# Patient Record
Sex: Male | Born: 1950
Health system: Southern US, Community
[De-identification: ages and names within clinical notes are randomized; demographics above are authoritative.]

## PROBLEM LIST (undated history)

## (undated) DIAGNOSIS — A539 Syphilis, unspecified: Secondary | ICD-10-CM

## (undated) DIAGNOSIS — L282 Other prurigo: Secondary | ICD-10-CM

## (undated) DIAGNOSIS — Z87442 Personal history of urinary calculi: Secondary | ICD-10-CM

## (undated) DIAGNOSIS — Z21 Asymptomatic human immunodeficiency virus [HIV] infection status: Secondary | ICD-10-CM

## (undated) DIAGNOSIS — C189 Malignant neoplasm of colon, unspecified: Secondary | ICD-10-CM

## (undated) DIAGNOSIS — N529 Male erectile dysfunction, unspecified: Secondary | ICD-10-CM

## (undated) DIAGNOSIS — N400 Enlarged prostate without lower urinary tract symptoms: Secondary | ICD-10-CM

## (undated) DIAGNOSIS — B20 Human immunodeficiency virus [HIV] disease: Secondary | ICD-10-CM

## (undated) DIAGNOSIS — D751 Secondary polycythemia: Secondary | ICD-10-CM

## (undated) DIAGNOSIS — I10 Essential (primary) hypertension: Secondary | ICD-10-CM

## (undated) HISTORY — DX: Asymptomatic human immunodeficiency virus (hiv) infection status: Z21

## (undated) HISTORY — PX: COLONOSCOPY: SHX174

## (undated) HISTORY — DX: Secondary polycythemia: D75.1

## (undated) HISTORY — PX: PARTIAL COLECTOMY: SHX5273

## (undated) HISTORY — DX: Human immunodeficiency virus (HIV) disease: B20

## (undated) HISTORY — DX: Benign prostatic hyperplasia without lower urinary tract symptoms: N40.0

## (undated) HISTORY — DX: Malignant neoplasm of colon, unspecified: C18.9

## (undated) HISTORY — DX: Male erectile dysfunction, unspecified: N52.9

## (undated) HISTORY — DX: Other prurigo: L28.2

## (undated) HISTORY — DX: Syphilis, unspecified: A53.9

---

## 1997-12-17 ENCOUNTER — Emergency Department (HOSPITAL_COMMUNITY): Admission: EM | Admit: 1997-12-17 | Discharge: 1997-12-17 | Payer: Self-pay | Admitting: Emergency Medicine

## 1997-12-17 ENCOUNTER — Encounter: Payer: Self-pay | Admitting: Emergency Medicine

## 1998-08-19 ENCOUNTER — Emergency Department (HOSPITAL_COMMUNITY): Admission: EM | Admit: 1998-08-19 | Discharge: 1998-08-19 | Payer: Self-pay | Admitting: Emergency Medicine

## 2000-03-05 ENCOUNTER — Emergency Department (HOSPITAL_COMMUNITY): Admission: EM | Admit: 2000-03-05 | Discharge: 2000-03-05 | Payer: Self-pay | Admitting: Emergency Medicine

## 2000-03-05 ENCOUNTER — Encounter: Payer: Self-pay | Admitting: Emergency Medicine

## 2000-03-16 ENCOUNTER — Encounter: Payer: Self-pay | Admitting: Urology

## 2000-03-16 ENCOUNTER — Encounter: Admission: RE | Admit: 2000-03-16 | Discharge: 2000-03-16 | Payer: Self-pay | Admitting: Urology

## 2003-01-08 ENCOUNTER — Ambulatory Visit (HOSPITAL_COMMUNITY): Admission: RE | Admit: 2003-01-08 | Discharge: 2003-01-08 | Payer: Self-pay | Admitting: Vascular Surgery

## 2004-08-16 ENCOUNTER — Inpatient Hospital Stay (HOSPITAL_COMMUNITY): Admission: EM | Admit: 2004-08-16 | Discharge: 2004-08-25 | Payer: Self-pay | Admitting: *Deleted

## 2004-08-31 ENCOUNTER — Emergency Department (HOSPITAL_COMMUNITY): Admission: EM | Admit: 2004-08-31 | Discharge: 2004-09-01 | Payer: Self-pay | Admitting: Emergency Medicine

## 2004-08-31 ENCOUNTER — Encounter: Admission: RE | Admit: 2004-08-31 | Discharge: 2004-08-31 | Payer: Self-pay | Admitting: General Surgery

## 2004-10-08 ENCOUNTER — Encounter: Admission: RE | Admit: 2004-10-08 | Discharge: 2004-10-08 | Payer: Self-pay | Admitting: General Surgery

## 2004-10-26 ENCOUNTER — Encounter (INDEPENDENT_AMBULATORY_CARE_PROVIDER_SITE_OTHER): Payer: Self-pay | Admitting: *Deleted

## 2004-10-26 ENCOUNTER — Inpatient Hospital Stay (HOSPITAL_COMMUNITY): Admission: RE | Admit: 2004-10-26 | Discharge: 2004-10-31 | Payer: Self-pay | Admitting: General Surgery

## 2007-07-19 ENCOUNTER — Emergency Department (HOSPITAL_COMMUNITY): Admission: EM | Admit: 2007-07-19 | Discharge: 2007-07-19 | Payer: Self-pay | Admitting: Emergency Medicine

## 2008-05-14 ENCOUNTER — Inpatient Hospital Stay (HOSPITAL_COMMUNITY): Admission: EM | Admit: 2008-05-14 | Discharge: 2008-05-21 | Payer: Self-pay | Admitting: Emergency Medicine

## 2008-05-15 ENCOUNTER — Encounter (INDEPENDENT_AMBULATORY_CARE_PROVIDER_SITE_OTHER): Payer: Self-pay | Admitting: *Deleted

## 2008-05-15 LAB — CONVERTED CEMR LAB
CD4 Count: 40 microliters
CD4 T Helper %: 7 %
HIV 1 RNA Quant: 65200 copies/mL
Hep A Total Ab: NEGATIVE

## 2008-05-16 ENCOUNTER — Ambulatory Visit: Payer: Self-pay | Admitting: Infectious Disease

## 2008-05-17 ENCOUNTER — Encounter (INDEPENDENT_AMBULATORY_CARE_PROVIDER_SITE_OTHER): Payer: Self-pay | Admitting: Interventional Radiology

## 2008-05-20 LAB — CONVERTED CEMR LAB
HCV Ab: NEGATIVE
Hep B S Ab: NEGATIVE
Hepatitis B Surface Ag: NEGATIVE
Triglyceride fasting, serum: 90 mg/dL

## 2008-06-06 ENCOUNTER — Ambulatory Visit: Payer: Self-pay | Admitting: Infectious Diseases

## 2008-06-06 DIAGNOSIS — B20 Human immunodeficiency virus [HIV] disease: Secondary | ICD-10-CM | POA: Insufficient documentation

## 2008-06-06 DIAGNOSIS — B370 Candidal stomatitis: Secondary | ICD-10-CM | POA: Insufficient documentation

## 2008-06-06 DIAGNOSIS — D61818 Other pancytopenia: Secondary | ICD-10-CM | POA: Insufficient documentation

## 2008-06-06 DIAGNOSIS — J984 Other disorders of lung: Secondary | ICD-10-CM

## 2008-06-06 DIAGNOSIS — N200 Calculus of kidney: Secondary | ICD-10-CM

## 2008-06-07 ENCOUNTER — Encounter: Payer: Self-pay | Admitting: Infectious Diseases

## 2008-07-22 ENCOUNTER — Encounter (INDEPENDENT_AMBULATORY_CARE_PROVIDER_SITE_OTHER): Payer: Self-pay | Admitting: *Deleted

## 2008-07-23 ENCOUNTER — Ambulatory Visit: Payer: Self-pay | Admitting: Infectious Diseases

## 2008-07-23 LAB — CONVERTED CEMR LAB
ALT: 37 units/L (ref 0–53)
AST: 30 units/L (ref 0–37)
Albumin: 4.2 g/dL (ref 3.5–5.2)
CO2: 24 meq/L (ref 19–32)
Chlamydia, Swab/Urine, PCR: NEGATIVE
Cholesterol: 104 mg/dL (ref 0–200)
Eosinophils Absolute: 0.1 10*3/uL (ref 0.0–0.7)
Eosinophils Relative: 3 % (ref 0–5)
GC Probe Amp, Urine: NEGATIVE
GFR calc non Af Amer: >60 mL/min (ref >60)
HDL: 33 mg/dL — ABNORMAL LOW (ref >39)
HIV-1 RNA Quant, Log: <1.68 (ref <1.68)
LDL Cholesterol: 52 mg/dL (ref 0–99)
MCHC: 33.2 g/dL (ref 30.0–36.0)
MCV: 100 fL (ref 78.0–100.0)
Monocytes Absolute: 0.4 10*3/uL (ref 0.1–1.0)
Monocytes Relative: 10 % (ref 3–12)
RDW: 15.1 % (ref 11.5–15.5)
Total Bilirubin: 0.8 mg/dL (ref 0.3–1.2)
Total CHOL/HDL Ratio: 3.2
Triglycerides: 96 mg/dL (ref <150)
WBC: 4.5 10*3/uL (ref 4.0–10.5)

## 2008-10-03 ENCOUNTER — Ambulatory Visit: Payer: Self-pay | Admitting: Infectious Diseases

## 2008-10-03 LAB — CONVERTED CEMR LAB
ALT: 38 units/L (ref 0–53)
AST: 32 units/L (ref 0–37)
Basophils Absolute: 0 10*3/uL (ref 0.0–0.1)
Basophils Relative: 0 % (ref 0–1)
Calcium: 8.8 mg/dL (ref 8.4–10.5)
Eosinophils Absolute: 0.1 10*3/uL (ref 0.0–0.7)
Eosinophils Relative: 4 % (ref 0–5)
Glucose, Bld: 109 mg/dL — ABNORMAL HIGH (ref 70–99)
HIV 1 RNA Quant: 67 copies/mL — ABNORMAL HIGH (ref <48)
HIV-1 RNA Quant, Log: 1.83 — ABNORMAL HIGH (ref <1.68)
Lymphocytes Relative: 25 % (ref 12–46)
Lymphs Abs: 0.9 10*3/uL (ref 0.7–4.0)
MCV: 100.5 fL — ABNORMAL HIGH (ref >=78.0)
Monocytes Relative: 8 % (ref 3–12)
RDW: 13 % (ref 11.5–15.5)
Total Bilirubin: 0.9 mg/dL (ref 0.3–1.2)
Total Protein: 7.4 g/dL (ref 6.0–8.3)
WBC: 3.5 10*3/uL — ABNORMAL LOW (ref 4.0–10.5)

## 2008-10-17 ENCOUNTER — Ambulatory Visit: Payer: Self-pay | Admitting: Infectious Diseases

## 2008-10-17 DIAGNOSIS — K099 Cyst of oral region, unspecified: Secondary | ICD-10-CM

## 2008-10-17 DIAGNOSIS — K5732 Diverticulitis of large intestine without perforation or abscess without bleeding: Secondary | ICD-10-CM | POA: Insufficient documentation

## 2008-10-17 DIAGNOSIS — D126 Benign neoplasm of colon, unspecified: Secondary | ICD-10-CM | POA: Insufficient documentation

## 2009-02-05 ENCOUNTER — Ambulatory Visit: Payer: Self-pay | Admitting: Infectious Diseases

## 2009-02-05 LAB — CONVERTED CEMR LAB
AST: 24 units/L (ref 0–37)
Albumin: 4.1 g/dL (ref 3.5–5.2)
Basophils Relative: 0 % (ref 0–1)
CO2: 23 meq/L (ref 19–32)
Calcium: 8.6 mg/dL (ref 8.4–10.5)
Chloride: 104 meq/L (ref 96–112)
Eosinophils Absolute: 0.1 10*3/uL (ref 0.0–0.7)
Eosinophils Relative: 3 % (ref 0–5)
HIV-1 RNA Quant, Log: 1.87 — ABNORMAL HIGH (ref <1.68)
Hemoglobin: 14.3 g/dL (ref 13.0–17.0)
Lymphocytes Relative: 35 % (ref 12–46)
MCHC: 32.6 g/dL (ref 30.0–36.0)
MCV: 98.9 fL (ref >=78.0)
Monocytes Relative: 8 % (ref 3–12)
Neutro Abs: 2.4 10*3/uL (ref 1.7–7.7)

## 2009-02-19 ENCOUNTER — Ambulatory Visit: Payer: Self-pay | Admitting: Infectious Diseases

## 2009-08-18 ENCOUNTER — Ambulatory Visit: Payer: Self-pay | Admitting: Infectious Disease

## 2009-08-18 LAB — CONVERTED CEMR LAB
ALT: 42 units/L (ref 0–53)
Basophils Relative: 0 % (ref 0–1)
Eosinophils Absolute: 0.1 10*3/uL (ref 0.0–0.7)
HCT: 46 % (ref 39.0–52.0)
HIV 1 RNA Quant: <48 copies/mL (ref <48)
HIV-1 RNA Quant, Log: <1.68 (ref <1.68)
Hemoglobin: 15 g/dL (ref 13.0–17.0)
Lymphocytes Relative: 37 % (ref 12–46)
Lymphs Abs: 1.4 10*3/uL (ref 0.7–4.0)
MCV: 105.3 fL — ABNORMAL HIGH (ref 78.0–100.0)
Monocytes Absolute: 0.3 10*3/uL (ref 0.1–1.0)
Monocytes Relative: 7 % (ref 3–12)
Neutrophils Relative %: 53 % (ref 43–77)
Potassium: 4.2 meq/L (ref 3.5–5.3)
Sodium: 138 meq/L (ref 135–145)

## 2009-09-01 ENCOUNTER — Ambulatory Visit: Payer: Self-pay | Admitting: Infectious Disease

## 2009-09-02 ENCOUNTER — Encounter: Payer: Self-pay | Admitting: Infectious Disease

## 2009-09-02 LAB — CONVERTED CEMR LAB: GC Probe Amp, Urine: NEGATIVE

## 2010-03-03 NOTE — Assessment & Plan Note (Signed)
Summary: 3 MONTH RECHECK/CH   Primary Provider:  Clydie Braun MD  CC:  3 month follow up.  History of Present Illness: 60 yo with newly dxed HIV in 4/10 while inpt for renal stones.  Also had 2 lung lesions with bxp negative for malignancy.    He has been on atripla since 4/20 -  doing well with it.  No side effects or missed doses. No further issues with shingles except for a little rash rash on buttock where the site was Eating well.  N fevers chills, NS< diarhhear n/v/dysuria.   Not sexually active since d/c from Hospital - was seen by Health deptartment.  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  He thinks his boss may have found out about his HIV  Preventive Screening-Counseling & Management  Alcohol-Tobacco     Alcohol drinks/day: <1     Alcohol type: wine     Smoking Status: quit < 6 months     Packs/Day: 1.0     Year Started: 1975     Year Quit: 2010  Caffeine-Diet-Exercise     Caffeine use/day: occassionally coffee in the mornings     Does Patient Exercise: no     Type of exercise: active at work  Environmental education officer Use: yes   Updated Prior Medication List: ATRIPLA 600-200-300 MG TABS (EFAVIRENZ-EMTRICITAB-TENOFOVIR) take one at bedtime DAPSONE 100 MG TABS (DAPSONE) take one daily FLOMAX 0.4 MG XR24H-CAP (TAMSULOSIN HCL)  NYSTATIN 100000 UNIT/ML SUSP (NYSTATIN) apply to corners of mouth three times a day  Current Allergies (reviewed today): No known allergies  Past History:  Past Medical History: Last updated: 07/23/2008  1. Left upper lobe mass:  The patient had a diagnostic evaluation with       all the findings as noted above.  Pathology was ultimately found to       be negative for malignancy.  This is likely due to granuloma       formation and he can follow up with his infectious disease doctor         for further evaluation.   2. Newly diagnosed HIV/AIDS:  The patient was started on a started on       Atripla at the direction of Dr. Sampson Goon.  He was also put on PCP       and MAC prophylaxis.  He will follow up with Dr. Sampson Goon.   3. Left UPJ stone with hydronephrosis and hematuria:  The patient did       have an admission complaint of left-sided flank pain which was due       to an obstructing stone.  The patient was hydrated and his       hydronephrosis subsequently resolved.  His renal function has       remained stable.  There are no further complaints of hematuria.  He       will follow up with Dr. Patsi Sears of urology after discharge.   4. Pancytopenia:  This is likely HIV related.  He was given platelets       prior to his planned CT-guided biopsy.  The patient has had no       bleeding complications.  His blood counts are beginning to improve       slightly with treatment of his underlying HIV.  5. Transaminitis:  This is mild and likely due to fatty liver.       Hepatitis serologies were negative.   6. Thrush:  The patient's thrush has resolved with treatment       consisting of Diflucan and nystatin.   7. Fatty liver:  This appears to be idiopathic.  The patient's lipid       profile was checked and found to be normal.  There is no evidence       of any viral hepatitis either.   8. Tobacco abuse:  The patient was counseled regarding the importance       of cessation.  He was provided with a nicotine patch while in the       hospital.      Family History: Last updated: 07/23/2008 [place]  Social History: Last updated: 07/23/2008 Lives alone - MSM Works as a Production designer, theatre/television/film of [employer]. Tobacco - quit in april 2010 while in hosp after 1ppd x 20 yrs Etoh - occas No other drugs.  Risk Factors: Alcohol Use: <1 (02/19/2009) Caffeine Use: occassionally coffee in the mornings (02/19/2009) Exercise: no (02/19/2009)  Risk Factors: Smoking Status: quit < 6 months  (02/19/2009) Packs/Day: 1.0 (02/19/2009)  Review of Systems       11 systems reviewed and negative except per HPI   Vital Signs:  Patient profile:   60 year old male Height:      70 inches (177.80 cm) Weight:      190.4 pounds (86.55 kg) BMI:     27.42 Temp:     97.5 degrees F (36.39 degrees C) oral Pulse rate:   65 / minute BP sitting:   132 / 83  (left arm)  Vitals Entered By: Baxter Hire) (February 19, 2009 10:44 AM) CC: 3 month follow up Is Patient Diabetic? No Pain Assessment Patient in pain? no      Nutritional Status BMI of 25 - 29 = overweight Nutritional Status Detail appetite is very good per patient  Have you ever been in a relationship where you felt threatened, hurt or afraid?No   Does patient need assistance? Functional Status Self care Ambulation Normal   Physical Exam  General:  alert and well-developed.   Head:  normocephalic and atraumatic.   Mouth:  op clear Neck:  supple.   Lungs:  normal respiratory effort and no intercostal retractions.   Heart:  normal rate and regular rhythm.   Abdomen:  soft, non-tender, and normal bowel sounds.   Msk:  normal ROM, no joint tenderness, and no joint swelling.   Extremities:  no cce Neurologic:  alert & oriented X3, cranial nerves II-XII intact, and strength normal in all extremities.   Skin:  no rashes.   Cervical Nodes:  no anterior cervical adenopathy and no posterior cervical adenopathy.   Psych:  Oriented X3 and memory intact for recent and remote.             Prevention For Positives: 02/19/2009   Safe sex practices discussed with patient. Condoms offered.   Education Materials Provided: 02/19/2009 Safe sex practices discussed with patient. Condoms offered.                          Impression & Recommendations:  Problem # 1:  HIV INFECTION (ICD-042)  Doing great on atripla with VL suppressed but cd4 still < 200.  Slow increase incd4 but may bea ble to stop PCP ppx at next visit  even if CD4 is not >200. His updated medication list for this problem includes:    Nystatin 100000 Unit/ml Susp (Nystatin) .Marland Kitchen... Apply to corners of mouth three times a day  Orders: Est. Patient Level IV (99214)Future Orders: T-CD4SP ([facility]) (CD4SP) ... 08/18/2009 T-HIV Viral Load [id number]) ... 08/18/2009 T-CBC w/Diff ([id number]) ... 08/18/2009 T-Comprehensive Metabolic Panel [id number]) ... 08/18/2009  Problem # 2:  COLONIC POLYPS (ICD-211.3) Unclear when his last colonoscopy was.  He thinks it was 7 yrs ago.  Given his age  will refer to Dr Elnoria Howard for Colonoscop  Orders: Gastroenterology Referral (GI)  Problem # 3:  PREVENTIVE HEALTH CARE (ICD-V70.0) uptodate on his vaccines - will give hep a and b today Orders: Gastroenterology Referral (GI)  Problem # 4:  RENAL CALCULUS (ICD-592.0) stable  Patient Instructions: 1)  Please schedule a follow-up appointment in 6 months with Dr Daiva Eves. 2)  Call for sooner apointment if needed. 3)  Be sure to return for lab work one (1) week before your next appointment as scheduled.  Prescriptions: FLOMAX 0.4 MG XR24H-CAP (TAMSULOSIN HCL)   #30 x 6   Entered and Authorized by:   Clydie Braun MD   Signed by:   Clydie Braun MD on 02/19/2009   Method used:   Print then Give to Patient   RxID:   [id number] DAPSONE 100 MG TABS (DAPSONE) take one daily  #30 x 6   Entered and Authorized by:   Clydie Braun MD   Signed by:   Clydie Braun MD on 02/19/2009   Method used:   Print then Give to Patient   RxID:   [id number] ATRIPLA 600-200-300 MG TABS (EFAVIRENZ-EMTRICITAB-TENOFOVIR) take one at bedtime  #30 x 6   Entered and Authorized by:   Clydie Braun MD   Signed by:   Clydie Braun MD on 02/19/2009   Method used:   Print then Give to Patient   RxID:   [id number]  Process Orders Check Orders Results:     [facility]: ABN not required for this insurance Tests Sent for  requisitioning (February 19, 2009 12:52 PM):     08/18/2009: [facility] -- T-HIV Viral Load [id number] (signed)     08/18/2009: [facility] -- T-CBC w/Diff [[id number]] (signed)     08/18/2009: [facility] -- T-Comprehensive Metabolic Panel [phone] (signed)    Influenza Immunization History:    Influenza # 1:  Historical (11/14/2008)  Hepatitis B Vaccine # 2 (to be given today)   Hepatitis A Vaccine # 2 (to be given today)

## 2010-03-03 NOTE — Assessment & Plan Note (Signed)
Summary: 2wk f/u/reas dr fitzgerald/vs   Visit Type:  New Patient Primary Provider:  Paulette Blanch Dam MD  CC:  f/u .  History of Present Illness: 60 yo with newly dxed HIV in 4/10 while inpt for renal stones. He has been on atripla since 4/20 -  doing well with it.  He has had some trouble sleepign which has now improved. Not sexually active since d/c from Hospital - was seen by Health deptartment. We reviewed his recent labs and discussed all of his current medications. He had no other specifica concernes today.  Problems Prior to Update: 1)  Colonic Polyps  (ICD-211.3) 2)  Diverticulitis of Colon  (ICD-562.11) 3)  Preventive Health Care  (ICD-V70.0) 4)  Cysts of Oral Soft Tissues  (ICD-528.4) 5)  Renal Calculus  (ICD-592.0) 6)  Thrush  (ICD-112.0) 7)  Lung Nodule  (ICD-518.89) 8)  Pancytopenia  (ICD-284.1) 9)  HIV Infection  (ICD-042) 10)  Need Prophylactic Vaccination&inoculation Flu  (ICD-V04.81)  Medications Prior to Update: 1)  Atripla 600-200-300 Mg Tabs (Efavirenz-Emtricitab-Tenofovir) .... Take One At Bedtime 2)  Dapsone 100 Mg Tabs (Dapsone) .... Take One Daily 3)  Flomax 0.4 Mg Xr24h-Cap (Tamsulosin Hcl) 4)  Nystatin 100000 Unit/ml Susp (Nystatin) .... Apply To Corners of Mouth Three Times A Day  Current Medications (verified): 1)  Atripla 600-200-300 Mg Tabs (Efavirenz-Emtricitab-Tenofovir) .... Take One At Bedtime 2)  Dapsone 100 Mg Tabs (Dapsone) .... Take One Daily 3)  Flomax 0.4 Mg Xr24h-Cap (Tamsulosin Hcl) 4)  Nystatin 100000 Unit/ml Susp (Nystatin) .... Apply To Corners of Mouth Three Times A Day  Allergies (verified): No Known Drug Allergies   Preventive Screening-Counseling & Management  Alcohol-Tobacco     Alcohol drinks/day: <1     Alcohol type: wine     Smoking Status: quit < 6 months     Packs/Day: 1.0     Year Started: 1975     Year Quit: 2010  Caffeine-Diet-Exercise     Caffeine use/day: occassionally coffee in the mornings     Does  Patient Exercise: no     Type of exercise: active at work   Current Allergies (reviewed today): No known allergies  Past History:  Past Medical History: Last updated: 07/23/2008  1. Left upper lobe mass:  The patient had a diagnostic evaluation with       all the findings as noted above.  Pathology was ultimately found to       be negative for malignancy.  This is likely due to granuloma       formation and he can follow up with his infectious disease doctor       for further evaluation.   2. Newly diagnosed HIV/AIDS:  The patient was started on a started on       Atripla at the direction of Dr. Sampson Goon.  He was also put on PCP       and MAC prophylaxis.  He will follow up with Dr. Sampson Goon.   3. Left UPJ stone with hydronephrosis and hematuria:  The patient did       have an admission complaint of left-sided flank pain which was due       to an obstructing stone.  The patient was hydrated and his       hydronephrosis subsequently resolved.  His renal function has       remained stable.  There are no further complaints of hematuria.  He       will follow up with  Dr. Patsi Sears of urology after discharge.   4. Pancytopenia:  This is likely HIV related.  He was given platelets       prior to his planned CT-guided biopsy.  The patient has had no       bleeding complications.  His blood counts are beginning to improve       slightly with treatment of his underlying HIV.   5. Transaminitis:  This is mild and likely due to fatty liver.       Hepatitis serologies were negative.   6. Thrush:  The patient's thrush has resolved with treatment       consisting of Diflucan and nystatin.   7. Fatty liver:  This appears to be idiopathic.  The patient's lipid       profile was checked and found to be normal.  There is no evidence       of any viral hepatitis either.   8. Tobacco abuse:  The patient was counseled regarding the importance       of cessation.  He was provided with a nicotine  patch while in the       hospital.      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 Tobacco - quit in april 2010 while in hosp after 1ppd x 20 yrs Etoh - occas No other drugs.  Risk Factors: Alcohol Use: <1 (09/01/2009) Caffeine Use: occassionally coffee in the mornings (09/01/2009) Exercise: no (09/01/2009)  Risk Factors: Smoking Status: quit < 6 months (09/01/2009) Packs/Day: 1.0 (09/01/2009)  Family History: Reviewed history from 07/23/2008 and no changes required. [place]  Social History: Reviewed history from 07/23/2008 and no changes required. Lives alone - MSM Works as a Production designer, theatre/television/film of [employer]. Tobacco - quit in april 2010 while in hosp after 1ppd x 20 yrs Etoh - occas No other drugs.  Review of Systems  The patient denies anorexia, fever, weight loss, weight gain, vision loss, decreased hearing, hoarseness, chest pain, syncope, dyspnea on exertion, peripheral edema, prolonged cough, headaches, hemoptysis, abdominal pain, melena, hematochezia, severe indigestion/heartburn, hematuria, incontinence, genital sores, muscle weakness, suspicious skin lesions, transient blindness, difficulty walking, depression, unusual weight change, abnormal bleeding, and enlarged lymph nodes.    Vital Signs:  Patient profile:   60 year old male Height:      70 inches (177.80 cm) Weight:      192 pounds (87.27 kg) BMI:     27.65 Temp:     98.2 degrees F (36.78 degrees C) oral Pulse rate:   77 / minute BP sitting:   133 / 86  (left arm)  Vitals Entered By: Starleen Arms CMA (September 01, 2009 10:42 AM) CC: f/u  Is Patient Diabetic? No Pain Assessment Patient in pain? no      Nutritional Status BMI of 25 - 29 = overweight Nutritional Status Detail nl  Does patient need assistance? Functional Status Self care Ambulation Normal   Physical Exam  General:  alert and well-developed.     Head:  normocephalic and atraumatic.   Eyes:  vision grossly intact, pupils equal, and pupils round.   Ears:  R ear normal and L ear normal.   Nose:  no external erythema.   Mouth:  op clear Neck:  supple.  supple and full ROM.   Chest Wall:  no deformities.   Lungs:  normal respiratory effort and no intercostal retractions.  Heart:  normal rate and regular rhythm.   Abdomen:  soft, non-tender, and normal bowel sounds.   Msk:  normal ROM, no joint tenderness, and no joint swelling.   Extremities:  no cce Neurologic:  alert & oriented X3,  and strength normal in all extremities.   Skin:  no rashes.   Psych:  Oriented X3 and memory intact for recent and remote.          Medication Adherence: 09/01/2009   Adherence to medications reviewed with patient. Counseling to provide adequate adherence provided   Prevention For Positives: 09/01/2009   Safe sex practices discussed with patient. Condoms offered.   Education Materials Provided: 09/01/2009 Safe sex practices discussed with patient. Condoms offered.                          Impression & Recommendations:  Problem # 1:  HIV INFECTION (ICD-042) Excellent control, needs time for cd4 to come up still. His updated medication list for this problem includes:    Nystatin 100000 Unit/ml Susp (Nystatin) .Marland Kitchen... Apply to corners of mouth three times a day  Orders: T-GC Probe, urine [id number]) T-Chlamydia  Probe, urine [phone]) T-RPR (Syphilis) [id number]) T-RPR (Syphilis) [id number]) T-GC Probe, urine [id number]) T-Chlamydia  Probe, urine [id number]) New Patient Level IV (99204)Future Orders: T-CD4SP ([facility]) (CD4SP) ... 02/28/2010 T-HIV Viral Load [id number]) ... 02/28/2010 T-CBC w/Diff ([id number]) ... 02/28/2010 T-Comprehensive Metabolic Panel [id number]) ... 02/28/2010 T-Lipid Profile [id number]) ... 02/28/2010  Diagnostics Reviewed:  HIV: CDC-defined AIDS (06/06/2008)   CD4: 120 (08/19/2009)    WBC: 3.8 (08/18/2009)   Hgb: 15.0 (08/18/2009)   HCT: 46.0 (08/18/2009)   Platelets: 142 (08/18/2009) HIV-1 RNA: <48 copies/mL (08/18/2009)   HBSAg: negative (05/20/2008)  Problem # 2:  LUNG NODULE (ICD-518.89)  was seemignly a granuloma. NO evidence of ongoing infection or malgiancny base on ssx  Orders: New Patient Level IV (35573)  Problem # 3:  RENAL CALCULUS (ICD-592.0)  seems to have been put on for this. continue for now (flomax)  Orders: New Patient Level IV (22025)  Problem # 4:  THRUSH (ICD-112.0)  resolved  Orders: New Patient Level IV (42706)  Other Orders: Hepatitis B Vaccine >21yrs [id number]) Admin 1st Vaccine (83151) DPT Vaccine (76160) Admin of Any Addtl Vaccine (73710) Hepatitis A Vaccine (Adult Dose) (62694)  Patient Instructions: 1)  rtc in 6 months to see Dr. Daiva Eves    Immunizations Administered:  Hepatitis B Vaccine # 2:    Vaccine Type: HepB Adult    Site: left deltoid    Mfr: Merck    Dose: 0.5 ml    Route: IM    Given by: Starleen Arms CMA    Exp. Date: 06/01/2011    Lot #: [id number]    VIS given: 08/18/05 version given September 01, 2009.  DPT Vaccine # 1:    Vaccine Type: DPT    Site: right deltoid    Mfr: boostrix    Dose: 0.5 ml    Route: IM    Given by: Starleen Arms CMA    Exp. Date: 04/26/2011    Lot #: [id number]    VIS given: 06/17/05 version given September 01, 2009.  Hepatitis A Vaccine # 2:    Vaccine Type: HepA    Site: left deltoid    Mfr: GlaxoSmithKline    Dose: 0.5 ml    Route: IM    Given by: Starleen Arms CMA    Exp. Date: 01/01/2012  Lot #: [id number]    VIS given: 04/21/04 version given September 01, 2009. Prescriptions: FLOMAX 0.4 MG XR24H-CAP (TAMSULOSIN HCL)   #30 x 11   Entered and Authorized by:   Acey Lav MD   Signed by:   Paulette Blanch Dam MD on 09/01/2009   Method used:   Print then Give to Patient   RxID:   [id number] DAPSONE 100 MG TABS (DAPSONE) take one daily  #30 x 11    Entered and Authorized by:   Acey Lav MD   Signed by:   Paulette Blanch Dam MD on 09/01/2009   Method used:   Print then Give to Patient   RxID:   [id number] ATRIPLA 600-200-300 MG TABS (EFAVIRENZ-EMTRICITAB-TENOFOVIR) take one at bedtime  #30 x 11   Entered and Authorized by:   Acey Lav MD   Signed by:   Paulette Blanch Dam MD on 09/01/2009   Method used:   Print then Give to Patient   RxID:   [id number]

## 2010-03-11 ENCOUNTER — Encounter (INDEPENDENT_AMBULATORY_CARE_PROVIDER_SITE_OTHER): Payer: Self-pay | Admitting: *Deleted

## 2010-03-13 ENCOUNTER — Encounter (INDEPENDENT_AMBULATORY_CARE_PROVIDER_SITE_OTHER): Payer: Self-pay | Admitting: *Deleted

## 2010-03-19 NOTE — Miscellaneous (Signed)
  Clinical Lists Changes  Observations: Added new observation of RACE: [place] (03/13/2010 11:23)

## 2010-03-19 NOTE — Miscellaneous (Signed)
  Clinical Lists Changes  Observations: Added new observation of [place]: [place] (03/11/2010 16:25)

## 2010-03-26 ENCOUNTER — Telehealth: Payer: Self-pay | Admitting: Infectious Disease

## 2010-05-05 NOTE — Progress Notes (Signed)
Summary: call again for flu shot  Phone Note Outgoing Call   Call placed by: Acey Lav MD,  March 26, 2010 3:41 PM Details for Reason: Patient needs FLU SHOT Summary of Call: Patient needs flu shot please call him and bring him in for flu shot Initial call taken by: Acey Lav MD,  March 26, 2010 3:42 PM  Follow-up for Phone Call        left message asking him to call back & let us know if he got it elsewhere or if he can stop by & get it here Follow-up by: Golden Circle RN,  March 27, 2010 9:33 AM  Additional Follow-up for Phone Call Additional follow up Details #1::        Lets call him again. Thanks Tamika Acey Lav MD  April 14, 2010 10:10 AM

## 2010-05-08 LAB — T-HELPER CELL (CD4) - (RCID CLINIC ONLY)
CD4 % Helper T Cell: 14 % — ABNORMAL LOW (ref 33–55)
CD4 T Cell Abs: 130 uL — ABNORMAL LOW (ref 400–2700)

## 2010-05-11 LAB — T-HELPER CELL (CD4) - (RCID CLINIC ONLY): CD4 % Helper T Cell: 12 % — ABNORMAL LOW (ref 33–55)

## 2010-05-13 LAB — DIFFERENTIAL
Basophils Absolute: 0 10*3/uL (ref 0.0–0.1)
Basophils Relative: 0 % (ref 0–1)
Lymphocytes Relative: 18 % (ref 12–46)
Monocytes Absolute: 0.3 10*3/uL (ref 0.1–1.0)
Neutro Abs: 1.6 10*3/uL — ABNORMAL LOW (ref 1.7–7.7)
Neutrophils Relative %: 69 % (ref 43–77)

## 2010-05-13 LAB — PSA: PSA: 0.25 ng/mL (ref 0.10–4.00)

## 2010-05-13 LAB — BASIC METABOLIC PANEL
BUN: 10 mg/dL (ref 6–23)
BUN: 8 mg/dL (ref 6–23)
CO2: 26 mEq/L (ref 19–32)
CO2: 24 mEq/L (ref 19–32)
Calcium: 8 mg/dL — ABNORMAL LOW (ref 8.4–10.5)
Calcium: 8.3 mg/dL — ABNORMAL LOW (ref 8.4–10.5)
Chloride: 111 mEq/L (ref 96–112)
Creatinine, Ser: 0.99 mg/dL (ref 0.4–1.5)
Creatinine, Ser: 1.12 mg/dL (ref 0.4–1.5)
Creatinine, Ser: 1.03 mg/dL (ref 0.4–1.5)
GFR calc Af Amer: >60 mL/min (ref >60)
GFR calc Af Amer: >60 mL/min (ref >60)
GFR calc Af Amer: >60 mL/min (ref >60)
GFR calc non Af Amer: >60 mL/min (ref >60)
GFR calc non Af Amer: >60 mL/min (ref >60)
Glucose, Bld: 103 mg/dL — ABNORMAL HIGH (ref 70–99)
Glucose, Bld: 95 mg/dL (ref 70–99)
Potassium: 3.8 mEq/L (ref 3.5–5.1)
Sodium: 140 mEq/L (ref 135–145)

## 2010-05-13 LAB — CBC
HCT: 31.8 % — ABNORMAL LOW (ref 39.0–52.0)
HCT: 35 % — ABNORMAL LOW (ref 39.0–52.0)
HCT: 35.4 % — ABNORMAL LOW (ref 39.0–52.0)
HCT: 40.5 % (ref 39.0–52.0)
Hemoglobin: 12.5 g/dL — ABNORMAL LOW (ref 13.0–17.0)
Hemoglobin: 12.6 g/dL — ABNORMAL LOW (ref 13.0–17.0)
Hemoglobin: 13.4 g/dL (ref 13.0–17.0)
Hemoglobin: 14.1 g/dL (ref 13.0–17.0)
MCHC: 34.6 g/dL (ref 30.0–36.0)
MCHC: 34.9 g/dL (ref 30.0–36.0)
MCHC: 34.9 g/dL (ref 30.0–36.0)
MCV: 96.4 fL (ref 78.0–100.0)
MCV: 96.9 fL (ref 78.0–100.0)
Platelets: 53 10*3/uL — ABNORMAL LOW (ref 150–400)
Platelets: 87 10*3/uL — ABNORMAL LOW (ref 150–400)
Platelets: 50 10*3/uL — ABNORMAL LOW (ref 150–400)
Platelets: 65 10*3/uL — ABNORMAL LOW (ref 150–400)
RBC: 3.33 MIL/uL — ABNORMAL LOW (ref 4.22–5.81)
RBC: 3.7 MIL/uL — ABNORMAL LOW (ref 4.22–5.81)
RBC: 3.71 MIL/uL — ABNORMAL LOW (ref 4.22–5.81)
RBC: 3.95 MIL/uL — ABNORMAL LOW (ref 4.22–5.81)
RDW: 13.9 % (ref 11.5–15.5)
RDW: 14 % (ref 11.5–15.5)
RDW: 14.3 % (ref 11.5–15.5)
RDW: 14.5 % (ref 11.5–15.5)
WBC: 2 10*3/uL — ABNORMAL LOW (ref 4.0–10.5)
WBC: 2.4 10*3/uL — ABNORMAL LOW (ref 4.0–10.5)
WBC: 1.7 10*3/uL — ABNORMAL LOW (ref 4.0–10.5)

## 2010-05-13 LAB — URINE CULTURE
Colony Count: NO GROWTH
Culture: NO GROWTH

## 2010-05-13 LAB — COMPREHENSIVE METABOLIC PANEL
ALT: 42 U/L (ref 0–53)
AST: 49 U/L — ABNORMAL HIGH (ref 0–37)
Albumin: 2.8 g/dL — ABNORMAL LOW (ref 3.5–5.2)
Albumin: 2.8 g/dL — ABNORMAL LOW (ref 3.5–5.2)
Albumin: 3.4 g/dL — ABNORMAL LOW (ref 3.5–5.2)
Alkaline Phosphatase: 42 U/L (ref 39–117)
Alkaline Phosphatase: 49 U/L (ref 39–117)
Alkaline Phosphatase: 44 U/L (ref 39–117)
BUN: 10 mg/dL (ref 6–23)
BUN: 9 mg/dL (ref 6–23)
BUN: 9 mg/dL (ref 6–23)
CO2: 24 mEq/L (ref 19–32)
Calcium: 8.6 mg/dL (ref 8.4–10.5)
Calcium: 8.3 mg/dL — ABNORMAL LOW (ref 8.4–10.5)
Chloride: 109 mEq/L (ref 96–112)
Chloride: 110 mEq/L (ref 96–112)
Creatinine, Ser: 0.99 mg/dL (ref 0.4–1.5)
Creatinine, Ser: 1.05 mg/dL (ref 0.4–1.5)
GFR calc Af Amer: >60 mL/min (ref >60)
GFR calc non Af Amer: >60 mL/min (ref >60)
Glucose, Bld: 107 mg/dL — ABNORMAL HIGH (ref 70–99)
Glucose, Bld: 107 mg/dL — ABNORMAL HIGH (ref 70–99)
Glucose, Bld: 92 mg/dL (ref 70–99)
Potassium: 3.6 mEq/L (ref 3.5–5.1)
Potassium: 3.9 mEq/L (ref 3.5–5.1)
Potassium: 4.4 mEq/L (ref 3.5–5.1)
Sodium: 144 mEq/L (ref 135–145)
Total Bilirubin: 1.4 mg/dL — ABNORMAL HIGH (ref 0.3–1.2)
Total Bilirubin: 1 mg/dL (ref 0.3–1.2)
Total Protein: 7.9 g/dL (ref 6.0–8.3)
Total Protein: 6.2 g/dL (ref 6.0–8.3)
Total Protein: 7.6 g/dL (ref 6.0–8.3)

## 2010-05-13 LAB — URINALYSIS, ROUTINE W REFLEX MICROSCOPIC
Nitrite: NEGATIVE
Protein, ur: 100 mg/dL — AB
Urobilinogen, UA: 1 mg/dL (ref 0.0–1.0)

## 2010-05-13 LAB — PREPARE PLATELETS

## 2010-05-13 LAB — GLUCOSE, CAPILLARY
Glucose-Capillary: 76 mg/dL (ref 70–99)
Glucose-Capillary: 76 mg/dL (ref 70–99)
Glucose-Capillary: 76 mg/dL (ref 70–99)
Glucose-Capillary: 76 mg/dL (ref 70–99)

## 2010-05-13 LAB — HEPATITIS PANEL, ACUTE
HCV Ab: NEGATIVE
Hep A IgM: NEGATIVE

## 2010-05-13 LAB — AFB CULTURE, BLOOD

## 2010-05-13 LAB — CRYPTOCOCCAL ANTIGEN: Crypto Ag: NEGATIVE

## 2010-05-13 LAB — GLUCOSE 6 PHOSPHATE DEHYDROGENASE: G-6-PD, Quant: 12 U/g{Hb} (ref 7–20)

## 2010-05-13 LAB — HIV 1/2 CONFIRMATION: HIV-2 Ab: NEGATIVE

## 2010-05-13 LAB — T-HELPER CELLS (CD4) COUNT (NOT AT ARMC)
CD4 % Helper T Cell: 7 % — ABNORMAL LOW (ref 33–55)
CD4 T Cell Abs: 40 uL — ABNORMAL LOW (ref 400–2700)

## 2010-05-13 LAB — FUNGUS CULTURE W SMEAR

## 2010-05-13 LAB — LIPID PANEL
LDL Cholesterol: 61 mg/dL (ref 0–99)
Triglycerides: 90 mg/dL (ref <150)
VLDL: 18 mg/dL (ref 0–40)

## 2010-05-13 LAB — BODY FLUID CULTURE: Culture: NO GROWTH

## 2010-05-13 LAB — CEA: CEA: 1.1 ng/mL (ref 0.0–5.0)

## 2010-05-13 LAB — HIV-1 RNA QUANT-NO REFLEX-BLD: HIV 1 RNA Quant: 65200 copies/mL — ABNORMAL HIGH (ref <48)

## 2010-05-13 LAB — HIV ANTIBODY (ROUTINE TESTING W REFLEX): HIV: REACTIVE — AB

## 2010-05-13 LAB — ABO/RH: ABO/RH(D): A POS

## 2010-05-13 LAB — AFB CULTURE WITH SMEAR (NOT AT ARMC)

## 2010-05-27 ENCOUNTER — Emergency Department (HOSPITAL_COMMUNITY): Payer: Worker's Compensation

## 2010-05-27 ENCOUNTER — Emergency Department (HOSPITAL_COMMUNITY)
Admission: EM | Admit: 2010-05-27 | Discharge: 2010-05-27 | Disposition: A | Discharge status: Home / Self Care | Payer: Worker's Compensation | Attending: Emergency Medicine | Admitting: Emergency Medicine

## 2010-05-27 DIAGNOSIS — S0003XA Contusion of scalp, initial encounter: Secondary | ICD-10-CM | POA: Insufficient documentation

## 2010-05-27 DIAGNOSIS — Y9269 Other specified industrial and construction area as the place of occurrence of the external cause: Secondary | ICD-10-CM | POA: Insufficient documentation

## 2010-05-27 DIAGNOSIS — Y990 Civilian activity done for income or pay: Secondary | ICD-10-CM | POA: Insufficient documentation

## 2010-05-27 DIAGNOSIS — S0083XA Contusion of other part of head, initial encounter: Secondary | ICD-10-CM | POA: Insufficient documentation

## 2010-05-27 DIAGNOSIS — S0990XA Unspecified injury of head, initial encounter: Secondary | ICD-10-CM | POA: Insufficient documentation

## 2010-05-27 DIAGNOSIS — W208XXA Other cause of strike by thrown, projected or falling object, initial encounter: Secondary | ICD-10-CM | POA: Insufficient documentation

## 2010-06-16 NOTE — Consult Note (Signed)
[name], [name] NO.:  [id number]   MEDICAL RECORD NO.:  [id number]          PATIENT TYPE:  INP   LOCATION:  1317                         FACILITY:  [facility]   PHYSICIAN:  Acey Lav, MD  DATE OF BIRTH:  1950-08-03   DATE OF CONSULTATION:  05/16/2008  DATE OF DISCHARGE:                                 CONSULTATION   REQUESTING PHYSICIAN:  Dr. Thayer Ohm Rama.   REASON FOR INFECTIOUS DISEASE CONSULTATION:  A patient newly diagnosed  with HIV, pancytopenia, and lung nodules.   HISTORY OF PRESENT ILLNESS:  [name] is a 60 year old Caucasian man with  a past medical history significant for prior kidney stones, vein  surgery, and sigmoid colectomy for diverticulitis who presents with  onset of left flank pain which was 10/10 in severity.  He also had  developed thrush and had decreased energy for least a week, if not  longer.  The patient is admitted to the hospitalist service and found on  chest x-ray to have a nodular lesion in left medial lung field.  He had  imaging of the abdomen with a CT scan and CT of the chest with contrast  which showed a left upper lobe nodule, a 1.2 x 1 x 1.1 cm mass with some  adjacent pleural thickening, as well as a 1 x 0.9 x 0.6 cm nodular  density near the left hilum and in the right base some smaller nodules  with some lymphadenopathy including a right infrahilar lymph node of 1.5  x 1 cm.  These nodes were concerning for malignancy versus infection.  He also had a 3.8 proximal left ureteral obstructing stone on the left  side with hydronephrosis.  He was admitted to the hospitalist service  and given fluconazole and nystatin for his thrush.  He had an HIV test  performed which came back positive.  His admission labs were also  pertinent for pancytopenia with his platelets being 63,000 at admission  and dropping down to 50,000 today.  He had a PET scan done today which  showed two lesions with hypermetabolic activity in the  lung in the  anterior left upper lobe and also in the left infrahilar area.  We were  consulted to assist in the management of this patient with HIV,  pancytopenia, and lung nodule suspicious for malignancy versus an  infection.   PAST MEDICAL HISTORY:  1. Complicated sigmoid diverticulitis, status post exploratory      laparotomy with resection of the sigmoid colon on October 26, 2004.  2. History of removal of painful varicose vein on January 08, 2003.  3. Tobacco use.  4. Past kidney stones.   PAST SURGICAL HISTORY:  As described above.   MEDICATIONS PRIOR TO COMING TO THE HOSPITAL:  None.   CURRENT MEDICATIONS:  1. Fluconazole 150 mg daily.  2. Azithromycin 1200 mg weekly.  3. Nicotine patch.  4. Nystatin swish and swallow four times daily.  5. Protonix 40 mg daily.  6. Senna and docusate 1 tablet in the evening.  7.  Flomax 0.4 mg daily.  8. Bactrim 1 tablet daily.  9. Tylenol.  10.Albuterol.  11.Dulcolax.  12.Guaifenesin and dextromethorphan.  13.Maalox and Zofran.  14.Roxicodone  15.Fleet's sodium enema.  16.Ambien.   SOCIAL HISTORY:  The patient is a one pack per day smoker.  Occasional  alcohol.  No recreational drugs.  His risk factors for HIV include sex  with other men.  He was tested for HIV he believes one and a half to two  years ago.   FAMILY HISTORY:  The patient's mother has a pacemaker.  There is no  history of cancer in the immediate family.   ALLERGIES:  NO KNOWN DRUG ALLERGIES.   REVIEW OF SYSTEMS:  As described above in history of present illness.  Otherwise, 10-point review of systems pertinent for a rash in the groin  and diffuse weakness.  Otherwise, review of systems is negative.   PHYSICAL EXAMINATION:  Blood pressure 98/63, pulse 89, respirations 20,  pulse ox 96% room air.  Temperature maximum is currently is 99.5 which  is also his current temperature.  Weight is 81.6 kg.  GENERAL:  Pleasant gentleman in no acute distress,  slightly pale.  HEENT:  Normocephalic, atraumatic.  Pupils equal, round, and reactive to  light.  Sclerae anicteric.  Oropharynx:  Thrush has diminished  significantly.  NECK:  Supple.  No significant cervical lymphadenopathy.  CARDIOVASCULAR:  Regular rate and rhythm.  No murmurs, gallops or rubs.  LUNGS:  Clear to auscultation bilaterally without wheeze or rales.  ABDOMEN:  Soft, nondistended and nontender.  His spleen does not appear  enlarged.  Liver also seems to be normal size.  EXTREMITIES:  He has  some hyperpigmented macules on the left lower extremity which he is due  to the vein stripping.  He also has one his right anterior shin as well.   LABORATORY DATA:  Chest imaging and abdominal imaging as described  above.  Hepatitis panel negative for hepatitis B surface antigen, core  antibody, hepatitis A antibody and hepatitis C antibody.  HIV antibody  positive.  CEA antigen 1.1.  CD-4 count 40, percent 7. PSA was 0.25.  Comprehensive metabolic panel today:  Sodium 161 past 4404, chloride  111, bicarb 28, BUN and creatinine 8 and 0.96, glucose 292, alkaline  phosphatase 44, AST and ALT 49 and 42, bilirubin is 0.5, albumin 2.8.  CBC with differential:  White count for 1.7, hemoglobin 13.4, hematocrit  38.3, platelets 53.  Urine upon arrival with 7-10 white cells, too  numerous to count white blood cells.  Urine culture is negative.   IMPRESSION AND RECOMMENDATIONS:  This is a 60 year old Caucasian  gentleman with newly diagnosed human immunodeficiency virus,  pancytopenia, lung nodules concerning for malignancy, and a left-sided  obstructing kidney stone.  1. Human immunodeficiency virus:  The patient has acquired immune      deficiency syndrome with T-cell count of 40 with pancytopenia and      lung nodules.  He also has thrush.  A viral load and genotype had      been have been ordered and sent.  I have discussed various      antiretroviral therapies with the patient.   Certainly there has      been data early on with use of AZT containing regimen showing      improvement and in primary HIV related thrombocytopenia with AZT.      However, certainly, patients with pancytopenia with HIV will  generally improve with antiretroviral therapy period. Rather than      pursue and AZT containing regimen I would like to give this      gentleman a regimen which will be easy to take, easy to tolerate      and one to which he can be highly adherent.  Atripla 1 tablet daily      would fit these needs for this patient.  I have reviewed this      regimen along with other various regimens including boosted      protease inhibitor regimens, and raltegravir based regimen.  We      will start the Atripla tonight after his viral load and genotype      have been sent.  I am going to change him from Bactrim to dapsone      to avoid the myelosuppressive effects of Bactrim.  Additionally,      dapsone has had some benefit sometimes with primary HIV associated      thrombocytopenia.  He is already on azithromycin prophylaxis for      MAI.  I will check a G6PD level as well as I am putting him  on      dapsone for OI prophylaxis.  I will follow him closely the hospital      and follow him up in our clinic.  2. Lung nodules.  These are concerning for possible malignancy      including Kaposi's sarcoma or other non-HIV related malignancies.      Certainly the patient could also have an infection such as      cryptococcal infection of the lungs.  Lesions do not appear      consistent with tuberculosis and I have discontinued the AFB      cultures from sputum and have discontinued the precautions.  (Note,      I actually raised the question of TB when I initially had been      called but had not yet time to look at his films.  Now that I have      reviewed them, I do not think he has TB as lesions are not all      suggestive although they could be consistent with crypto or  another      dimorphic fungus.) I have talked to interventional radiology. I am      going to treat his thrombocytopenia )see below).  We plan on      getting IR guided biopsy tomorrow  with specimen to be sent for      pathology, as well as AFB, fungal, and bacterial cultures.  I am      sending a STAT cryptococcal antigen now.  If the cryptococcal      antigen comes back positive, we may do a lumbar puncture to exclude      cryptococcal meningitis.  In any case, he is still going to need      these lung nodules biopsied.  3. Pancytopenia with leukopenia, anemia, and prominent      thrombocytopenia.  This is most likely due to his HIV infection.      Certainly other possibilities would be disseminated infection such      as MAI or other opportunistic infection.  I will check an AFB blood      culture on this patient.  I am going to give him 2 gm/kg of      intravenous immunoglobulin tonight to try and improve his platelet  counts tomorrow before his lung biopsy (in case part of his      pathology is immune mediated, as primary hiv asociated ttpenia is..      We will recheck his platelets in the morning and if they are still      70,000 give him an 8 pack of platelets prior to the procedure.  I      will change him from Bactrim to dapsone to avoid the      myelosuppressive effects of Bactrim and also engage potential      beneficial effects toward platelets from dapsone.  4. Thrush.  Continue him on fluconazole 100 mg per day.  This may      decrease yield on cultures from his lung biopsy but 1 think we      still can arrive at a diagnosis.  5. Intertrigo.  I will put him on topical nystatin for this.  6. Kidney stone.  this is being managed with hydration and followed by      urology.  7. OI prophylaxis.  Put him on dapsone, check a G6PD level, put him on      azithromycin 1200 mg weekly.   Thank you for this very interesting infectious disease consultation.  I  will follow Mr.  Blackwell closely and follow him up in the outpatient clinic  as well.      Acey Lav, MD  Electronically Signed     CV/MEDQ  D:  05/16/2008  T:  05/16/2008  Job:  [id number]   cc:   Vonzell Schlatter. Patsi Sears, M.D.  Fax: [phone]

## 2010-06-16 NOTE — Consult Note (Signed)
[name], [name] NO.:  [id number]   MEDICAL RECORD NO.:  [id number]          PATIENT TYPE:  INP   LOCATION:  1317                         FACILITY:  [facility]   PHYSICIAN:  Sigmund I. Patsi Sears, M.D.DATE OF BIRTH:  March 19, 1950   DATE OF CONSULTATION:  05/15/2008  DATE OF DISCHARGE:                                 CONSULTATION   CHIEF COMPLAINT:  Left flank pain.   HISTORY OF PRESENT ILLNESS:  [name] is a 60 year old white male  admitted with history of left flank pain x1 week who states that he had  pain at admission of 10/10.  He does have a history of nephrolithiasis  in the past.  He is last seen by Dr. Isabel Caprice in our office in 2002.   PAST MEDICAL HISTORY:  Significant for tobacco use and kidney stones.   PAST SURGICAL HISTORY:  Significant for pain surgery and a sigmoid  colectomy.   HOME MEDICATIONS:  None.   ALLERGIES:  None.   SOCIAL HISTORY:  Pack the patient smokes 1 to 2 packs of cigarettes  times 20 years which is a 40 pack-year history.  Occasional EtOH.  Denies alcohol, denies drug abuse or marijuana abuse.  He is single.   FAMILY HISTORY:  His father died of a stroke.   REVIEW OF SYSTEMS:  Noncontributory except for left flank pain on  admission.   LABORATORY:  PSA is 0.25.  CMET sodium 144, potassium 4.4, chloride 111,  CO2 is 28, glucose 92, BUN is 8, creatinine 0.96.  CBC white count is  1.7, hemoglobin 13.4, hematocrit 38.3 and platelet is 53,000.   PHYSICAL EXAM:  GENERAL:  Shows shows a well-developed, well-nourished  white male in no acute distress.  HEENT:  Normocephalic, atraumatic.  Pupils equal and reactive to light.  CARDIOVASCULAR:  Heart regular rhythm and rate.  No murmurs, gallops or  rubs.  LUNGS:  Clear to auscultation.  ABDOMEN:  Is soft, nontender, positive bowel sounds x4 without  organomegaly or CVA tenderness.  GU AND RECTAL:  Deferred.  EXTREMITIES:  Normal to inspection.  No cyanosis, clubbing or edema.  PSYCH:  Alert and oriented and pleasant.   RADIOLOGY:  Shows a CT abdomen and pelvis shows fullness in the left  renal collecting system was proximal left ureteral 3.8-mm obstructing  stones in the pelvis.  There is no distal ureteral calculi identified.   ASSESSMENT:  Left ureteropelvic junction stone 3.8 mm with noted  hydronephrosis.   PLAN:  The patient to be discharged home to try to pass stone  spontaneously on Flomax 0.4 mg 1 p.o. daily and a strainer.  The patient  can be discharged on p.r.n. narcotic per medicine.  He will follow up in  our office at discharge.  If he has not passed the stone within 6 weeks  of discharge, he will be scheduled for a cystourethroscopy, left  retrograde pyelogram, and stone extraction.  He has been instructed that  if he becomes more acute, fever greater than 101, abdominal pain, nausea  and vomiting not controlled with p.r.n. medications,  he can be scheduled  at an earlier date for this procedure.  The patient at this time is in  agreement with this plan and once he has been cleared by medicine, he  can be discharged.      Jetta Lout, NP      Sigmund I. Patsi Sears, M.D.  Electronically Signed    DW/MEDQ  D:  05/15/2008  T:  05/15/2008  Job:  [id number]

## 2010-06-16 NOTE — Discharge Summary (Signed)
[name], [name] NO.:  [id number]   MEDICAL RECORD NO.:  [id number]          PATIENT TYPE:  INP   LOCATION:  1317                         FACILITY:  [facility]   PHYSICIAN:  Hillery Aldo, M.D.   DATE OF BIRTH:  1951-01-11   DATE OF ADMISSION:  05/14/2008  DATE OF DISCHARGE:  05/21/2008                               DISCHARGE SUMMARY   PRIMARY CARE PHYSICIAN:  None.   INFECTIOUS DISEASE PHYSICIAN:  Dr. Clydie Braun.   UROLOGIST:  Dr. Patsi Sears.   DISCHARGE DIAGNOSES:  1. Left upper lobe mass, negative for malignancy status post biopsy.      Biopsy results consistent with granuloma formation.  2. Newly diagnosed human immunodeficiency virus/acquired immune      deficiency syndrome.  3. Left ureteropelvic junction stone with hydronephrosis and      hematuria.  4. Pancytopenia, likely human immunodeficiency virus related.  5. Transaminitis.  6. Thrush.  7. Fatty liver.  8. Tobacco abuse.   DISCHARGE MEDICATIONS:  1. Azithromycin 1200 mg p.o. q. Wednesday.  2. Dapsone 100 mg p.o. daily.  3. Atripla one tablet p.o. q.h.s.  4. Flomax 0.4 mg p.o. q.h.s.   CONSULTATIONS:  1. Dr. Clydie Braun of infectious disease.  2. Dr. Patsi Sears of urology.  3. Dr. Fredia Sorrow of interventional radiology.   BRIEF ADMISSION HISTORY OF PRESENT ILLNESS:  The patient is a 60-year-  old male who presented to the hospital with a chief complaint of  worsening left flank pain.  He also noticed some oral exudate consistent  with thrush.  The patient reports having been tested for HIV  approximately 1 year back and reports that this testing was negative.  Nevertheless, he is a homosexual male and had risk factors and therefore  the patient was admitted for further evaluation and workup.  For the  full details, please see the dictated report done by Dr. Glade Lloyd.   PROCEDURES AND DIAGNOSTIC STUDIES:  1. Acute abdominal series on May 14, 2008 showed questionable  nodular lesion in the left medial lung field.  Moderate mount of      feces throughout the colon.  No obstruction or free air.  2. Chest x-ray on May 14, 2008 showed the small nodular opacity      noted on earlier chest x-ray persists.  CT of the chest      recommended.  3. CT scan of the chest, abdomen and pelvis on May 14, 2008 showed      findings suspicious for malignancy with largest lesion, left upper      lobe and smaller lesions as noted in the transcribed report.      Slightly prominent size of right hilar lymph node.  There is a 3.8      mm proximal left ureteral obstructing stone with left-sided      hydronephrosis and fatty infiltration of the liver.  There is mild      haziness of fat planes around the celiac axis with increased number      of normal sized lymph nodes.  Etiology indeterminate.  No obvious  esophageal mass detected.  Dilated gallbladder without calcified      gallstones.  Mild atherosclerotic type changes of the aorta without      aneurysmal dilatation.  There were no distal ureteral calculi      detected.  The lobulated contour of the prostate gland appeared      asymmetric.  4. PET scan on May 15, 2008 showed two lesions with hypermetabolic      activity identified suspicious for malignancy.  First was in the      anterior left upper lobe and the second was located in the left      infrahilar area.  Other nodules present did not appear      hypermetabolic but were felt to be below the sensitivity of the PET      scan for size thresholds.  Left proximal ureteral calculus remains      in place although currently there is no hydronephrosis.  5. Chest x-ray on May 17, 2008 showed bibasilar atelectasis      secondary to suboptimal inspiration.  Pulmonary nodules were      stable.  6. CT-guided lung biopsy performed on May 17, 2008 by Dr. Fredia Sorrow.   LABORATORY DATA:  Left upper lobe aspiration cultures were negative.  HIV viral load was 4.81,  with a quantitative count of 65,200.  Sodium  was 141, potassium 3.9, chloride 111, bicarb 24, BUN 90, creatinine  0.99, glucose 105.  Total bilirubin was 1.2, alkaline phosphatase 49,  AST 40, ALT 27, total protein 7.9, albumin 2.8, calcium 8.6.  White  blood cell count was 3.3, hemoglobin 12.4, hematocrit 35, platelets 87.  G6PD level was 12.  Fungal cultures of left upper lobe aspiration are  not yet finalized but the preliminary report is negative.  No acid fast  bacilli were seen on the smear.  Culture will be in progress for 6 weeks  before a final report is issued.   HOSPITAL COURSE:  1. Left upper lobe mass:  The patient had a diagnostic evaluation with      all the findings as noted above.  Pathology was ultimately found to      be negative for malignancy.  This is likely due to granuloma      formation and he can follow up with his infectious disease doctor      for further evaluation.  2. Newly diagnosed HIV/AIDS:  The patient was started on a started on      Atripla at the direction of Dr. Sampson Goon.  He was also put on PCP      and MAC prophylaxis.  He will follow up with Dr. Sampson Goon.  3. Left UPJ stone with hydronephrosis and hematuria:  The patient did      have an admission complaint of left-sided flank pain which was due      to an obstructing stone.  The patient was hydrated and his      hydronephrosis subsequently resolved.  His renal function has      remained stable.  There are no further complaints of hematuria.  He      will follow up with Dr. Patsi Sears of urology after discharge.  4. Pancytopenia:  This is likely HIV related.  He was given platelets      prior to his planned CT-guided biopsy.  The patient has had no      bleeding complications.  His blood counts are beginning to improve      slightly with  treatment of his underlying HIV.  5. Transaminitis:  This is mild and likely due to fatty liver.      Hepatitis serologies were negative.  6. Thrush:  The  patient's thrush has resolved with treatment      consisting of Diflucan and nystatin.  7. Fatty liver:  This appears to be idiopathic.  The patient's lipid      profile was checked and found to be normal.  There is no evidence      of any viral hepatitis either.  8. Tobacco abuse:  The patient was counseled regarding the importance      of cessation.  He was provided with a nicotine patch while in the      hospital.   DISPOSITION:  The patient is medically stable and will be discharged  home.  He will follow up Dr. Patsi Sears and Dr. Sampson Goon.   Time spent coordinating care for discharge and discharge instructions  equals 35 minutes.      Hillery Aldo, M.D.  Electronically Signed     CR/MEDQ  D:  05/21/2008  T:  05/21/2008  Job:  [id number]   cc:   Vonzell Schlatter. Patsi Sears, M.D.  Fax: [phone]   Mick Sell, MD

## 2010-06-16 NOTE — H&P (Signed)
NAMEBURTON, GAHAN NO.:  [id number]   MEDICAL RECORD NO.:  [id number]          PATIENT TYPE:  EMS   LOCATION:  ED                           FACILITY:  [facility]   PHYSICIAN:  Theodosia Paling, MD    DATE OF BIRTH:  1951-02-02   DATE OF ADMISSION:  05/14/2008  DATE OF DISCHARGE:                              HISTORY & PHYSICAL   PRIMARY CARE PHYSICIAN:  None.   CHIEF COMPLAINT:  Left flank pain.   HISTORY OF PRESENT ILLNESS:  [name] is a pleasant 60 year old  gentleman with a history of tobacco abuse and kidney stones who was in  his usual state of health till around 1 week back when he started to  feel left flank pain.  Today, it got worse to the point of 10/10, and he  presented to the ER for further evaluation and management.  For the last  month also, he has been noticing oral thrush in his mouth as well.  He  was tested for HIV 1 year back which was negative.  In the emergency  room, the patient underwent CT scan of the abdomen and pelvis for  possible renal stones, and they also saw some lung masses.  Subsequently, CT of the chest was also performed.  There was no  suspicion of lung malignancy.  CT of the abdomen showed hydronephrosis.  Therefore, [facility] were consulted for further evaluation  and management.   PAST MEDICAL HISTORY:  1. Tobacco abuse.  2. Kidney stones.   PAST SURGICAL HISTORY:  1. Vein surgery.  2. Sigmoid colectomy.   HOME MEDICATIONS:  None.   ALLERGIES:  None.   SOCIAL HISTORY:  The patient has smoked 1-2 packs per day for the last  20 years, occasional alcohol.  Denies IV drug abuse or cocaine or  marijuana abuse.  His sexual orientation is being homosexual.  He was  screened for HIV 1 year back and was found to be negative.  He is  currently working as a Production designer, theatre/television/film in [employer].   FAMILY HISTORY:  Father died of stroke, no other significant family  history of any medical condition.   REVIEW OF SYSTEMS:   Essentially negative except for what is mentioned in  the HPI.   LABORATORY DATA:  WB 2.3, hemoglobin 14.1, hematocrit 40.5, platelet  count 65, sodium 140, potassium 3.7, chloride 110, bicarbonate 27,  glucose of 107, BUN 9, creatinine 1.05, AST 34, ALT 56, albumin 3.4,  lipase 23.  UA was cloudy with moderate bilirubin, large blood of 113,  small leukocytes and negative for any crystals.  Urine culture is  pending at this time.   PHYSICAL EXAMINATION:  GENERAL:  No acute cardiorespiratory distress.  LUNGS:  Normal breath sounds, no rhonchi or crepitations.  CARDIOVASCULAR:  S1, S2 normal.  No murmur, gallop or rub heard.  GI:  Soft, nontender.  No organomegaly.  EXTREMITIES:  No pedal edema.  PSYCHIATRIC:  Oriented x3.  CNS:  Follows commands.  Motor and sensory intact.  Speech is intact.   RADIOLOGY DATA:  CT scan of the chest, abdomen and pelvis showing no  distal ureteral calculi; lobulated contour of the prostate gland appears  asymmetric.  Fatty infiltration of the liver, a 3.8 mm proximal left  ureteral obstructing stone with left-sided hydronephrosis and further  1.2 x 1 x 1.1 cm mass with ill-defined border in the left upper lobe,  then in the inferior aspect of the left hilum a nodular density  measuring 1 cm x 0.9 cm x 0.6 cm.  Within right base are 3 tiny nodular  densities measuring 3 to 4.8 mm and 3.8 mm.   ASSESSMENT AND PLAN:  1. Lung mass.  Most likely, patient has malignancy given his history.      It appears anterior, so I will obtain a CT-guided lung nodule      biopsy in the morning.  A request has been put in place.  2. I also will order a PET scan for staging for possible malignancy.      I have ordered a PSA given asymmetric appearance of the prostate      gland, although prostate cancer is less likely.  Oncology can be      consulted in the morning, oncology and pulmonary based on the      finding of PET scan and biopsy.  They can be consulted in the       morning.  At this time, the patient is hemodynamically stable.  3. Hydronephrosis.  The patient has obstructive renal stone.  However,      it is very small, and there is no evidence of urinary tract      infection or evidence of renal failure.  Still, I would go ahead      and consult urology and take their opinion.  4. Leukopenia and thrombocytopenia.  The patient is at risk for human      immunodeficiency virus. Do manual differential,  CD-4 and HIV      serology are requested.  5. Thrush.  IV Diflucan is given.  Human immunodeficiency virus will      be screened as a possible etiology.  Malignancy also could be      causing immunosuppression.  6. Transaminitis.  I have requested hepatitis serology.  There is no      obvious liver mass in CT scan.  I have included INR to look at the      synthetic function.  Albumin appears to be normal.  No obvious sign      of decompensation at this time or no sign of cirrhosis.   Deep venous thrombosis prophylaxis and gastrointestinal prophylaxis are  requested.   CODE STATUS:  The patient is full code.   The patient does not have PCP to which this can be forwarded.   Total time spent in admission of this patient was around 1 hour.      Theodosia Paling, MD  Electronically Signed     NP/MEDQ  D:  05/14/2008  T:  05/14/2008  Job:  [id number]

## 2010-06-19 NOTE — H&P (Signed)
[name], [name] NO.:  [id number]   MEDICAL RECORD NO.:  [id number]          PATIENT TYPE:  EMS   LOCATION:  ED                           FACILITY:  [facility]   PHYSICIAN:  Ollen Gross. Vernell Morgans, M.D. DATE OF BIRTH:  January 14, 1951   DATE OF ADMISSION:  08/16/2004  DATE OF DISCHARGE:                                HISTORY & PHYSICAL   HISTORY OF PRESENT ILLNESS:  [name] is a 60 year old white male who  presents to the emergency department today with lower abdominal pain that  has been going on for the last 5 days. He felt that it was similar pains to  what he has had with kidney stones in the past. He thought he noticed a  little blood in his urine. He has had fevers to 103 at home. No nausea or  vomiting, no diarrhea or dysuria, and no chest pain or shortness of breath.  His pain has worsened over the last 4 or 5 days to the point where he came  to the emergency room to seek further attention. His other review of systems  is unremarkable.   PAST MEDICAL HISTORY:  Significant for kidney stones.   PAST SURGICAL HISTORY:  Significant for vein surgery.   MEDICATIONS:  None.   ALLERGIES:  None.   SOCIAL HISTORY:  He does smoke about a pack of cigarettes a day. He only  occasionally drinks alcohol.   FAMILY HISTORY:  Noncontributory.   PHYSICAL EXAMINATION:  VITAL SIGNS:  His temperature is 100.1, blood  pressure 94/65, pulse of 83.  GENERAL:  He is a well-developed, well-nourished white male in no acute  distress.  SKIN:  Well-developed with no jaundice.  EYES:  His extraocular movements are intact. Pupils are equal, round, and  react to light. Sclerae are nonicteric.  LUNGS:  Clear bilaterally with no use of accessory respiratory muscles.  HEART:  Has a regular rate and rhythm with an impulse in the left chest.  ABDOMEN:  Soft with some significant lower abdominal tenderness but no  peritoneal signs and no palpable mass or hepatosplenomegaly.  EXTREMITIES:  No  cyanosis, clubbing, or edema. Good strength in the arms and  legs.  PSYCHOLOGICALLY:  He is alert and oriented x3 with no evidence today of  anxiety or depression.   On review of his lab work it was significant for a white count of 10.4. On  reviewing his CT scan, it was notable for some sigmoid colon diverticulitis  with a couple of dots of free air.   ASSESSMENT AND PLAN:  This is a 60 year old white male with evidence of  sigmoid diverticulitis with small microperforation. Given the fact that he  does not have diffuse abdominal pain and has no signs of sepsis, I do think  he would be a potential candidate for conservative management with  antibiotics and close monitoring. Certainly, if his pain worsens on this  regimen he will require urgent surgery which could mean a colectomy and  colostomy.  Otherwise, if he improves on his antibiotic therapy then he may  require  elective sigmoid colectomy, given the presence of some evidence of  microperforation. I have talked to him about this and he understands and  agrees with this treatment plan. Will admit him and start him on Zosyn and  bowel rest and watch him closely.       PST/MEDQ  D:  08/16/2004  T:  08/16/2004  Job:  [id number]

## 2010-06-19 NOTE — Discharge Summary (Signed)
NAMERIYAN, HAILE NO.:  [id number]   MEDICAL RECORD NO.:  [id number]          PATIENT TYPE:  INP   LOCATION:  1612                         FACILITY:  [facility]   PHYSICIAN:  Ollen Gross. Vernell Morgans, M.D. DATE OF BIRTH:  April 26, 1950   DATE OF ADMISSION:  10/26/2004  DATE OF DISCHARGE:  10/31/2004                                 DISCHARGE SUMMARY   BRIEF HISTORY AND PHYSICAL:  Mr. Matera is a 60 year old gentleman who I have  been following with complicated sigmoid diverticulitis.  On October 26, 2004, I brought him to the operating room where he underwent a sigmoid  colectomy.  He tolerated this well.   HOSPITAL COURSE:  On postop day 1, he was started simply on sips and chips,  and his bowel function was monitored.  Toradol was added for his pain  control on postop day 1.  On postop day 2, Reglan was added for slight  ileus.  On postop day 3, he was started on clear liquids, and his Foley was  discontinued.  His PCA was discontinued.  His diet was quickly advanced.  His pain was under good control, and on October 31, 2004, he was ready for  discharge home.   DISCHARGE MEDICATIONS:  He was to resume his home meds.  He was given a  prescription for Vicodin for pain.   ACTIVITY:  No heavy lifting.   DIET:  No restrictions.   FINAL DIAGNOSIS:  Complicated sigmoid diverticulitis.   FOLLOWUP:  Followup will be with Dr. Carolynne Edouard in 2 weeks.   CONDITION ON DISCHARGE:  Stable.  He is discharged home.      Ollen Gross. Vernell Morgans, M.D.  Electronically Signed     PST/MEDQ  D:  11/23/2004  T:  11/23/2004  Job:  [id number]

## 2010-06-19 NOTE — Op Note (Signed)
[name], [name]                             ACCOUNT NO.:  [id number]   MEDICAL RECORD NO.:  [id number]                   PATIENT TYPE:  OIB   LOCATION:  2899                                 FACILITY:  [facility]   PHYSICIAN:  Di Kindle. Edilia Bo, M.D.        DATE OF BIRTH:  12/11/1950   DATE OF PROCEDURE:  01/08/2003  DATE OF DISCHARGE:  01/08/2003                                 OPERATIVE REPORT   PREOPERATIVE DIAGNOSIS:  Painful varicose vein to the left lower extremity  with incompetence of the left greater saphenous vein.   POSTOPERATIVE DIAGNOSIS:  Painful varicose vein to the left lower extremity  with incompetence of the left greater saphenous vein.   PROCEDURE:  Ligation and stripping of the left greater saphenous vein with  segmental excision of varicosities of the left lower extremity.   SURGEON:  Di Kindle. Edilia Bo, M.D.   ASSISTANT:  Jerold Coombe, P.A.   ANESTHESIA:  General.   DESCRIPTION OF PROCEDURE:  The patient was taken to the operating room after  the veins had been marked with the patient standing.  The left lower  extremity was prepped and draped in the usual sterile fashion.  An oblique  incision was made in the left groin where the saphenofemoral junction was  dissected free and branches were divided between 3-0 silk ties.  The  stripper was then passed down the vein, and the vein was ligated around the  stripper.  The saphenofemoral junction was ligated with the 2-0 silk suture.  The stripper passed all the way to the level below the knee adjacent to a  cluster of varicosities.  An oblique incision was made at this level and the  stripper was brought out through the saphenous vein which was ligated and  divided.  This was then left in place.  Next, segmental excision of the  varicosity was performed using a stab avulsion technique.  Small  longitudinal incisions were made with a 15 blade over the clusters of  varicosities.  These were then  bluntly dissected out and avulsed.  Pressure  was held for hemostasis.   Next, with the patient in Trendelenburg an inversion stripping was performed  of the left greater saphenous vein.  Pressure was held for hemostasis.  Next, the groin incision was closed with a deep layer of 3-0 Vicryl and the  skin closed with 4-0 Vicryl.  The stab avulsion incisions were closed with  interrupted 4-0 Vicryl.  Sterile dressing and pressure dressing were  applied.  The patient tolerated the procedure well and was transferred to  the recovery room in satisfactory condition.  All needle and sponge counts  were correct.  Di Kindle. Edilia Bo, M.D.    CSD/MEDQ  D:  01/08/2003  T:  01/09/2003  Job:  [id number]

## 2010-06-19 NOTE — Op Note (Signed)
NAMEJAVANNI, [name] NO.:  [id number]   MEDICAL RECORD NO.:  [id number]          PATIENT TYPE:  INP   LOCATION:  1612                         FACILITY:  [facility]   PHYSICIAN:  Ollen Gross. Vernell Morgans, M.D. DATE OF BIRTH:  Mar 30, 1950   DATE OF PROCEDURE:  10/26/2004  DATE OF DISCHARGE:                                 OPERATIVE REPORT   PREOPERATIVE DIAGNOSIS:  Sigmoid colon diverticulitis.   POSTOPERATIVE DIAGNOSIS:  Sigmoid colon diverticulitis.   PROCEDURE:  Laparoscopic-assisted sigmoid colectomy.   SURGEON:  Dr. Carolynne Edouard   ASSISTANT:  Dr. Johna Sheriff   ANESTHESIA:  General endotracheal.   DESCRIPTION OF PROCEDURE:  After informed consent was obtained, the patient  was brought to the operating room and placed in a supine position on the  operating room table.  After adequate induction of general anesthesia, the  patient was placed in lithotomy position, and his abdomen and perirectal  region were prepped with Betadine and draped in the usual sterile manner.  The area above the umbilicus was infiltrated with 0.25% Marcaine; a small  incision was made with the 15 blade knife.  This incision was carried down  through the subcutaneous tissue bluntly with a hemostat and [employer]  retractors until the linea alba was identified.  The linea alba was incised  with the 15 blade knife, and each side was grasped with Kocher clamps and  elevated anteriorly.  The preperitoneal space was then probed bluntly with a  hemostat until the peritoneum was opened and access was gained to the  abdominal cavity.  A 0 Vicryl pursestring stitch was placed in the fascia  surrounding the opening.  An Hasson cannula was placed through the opening  and anchored in place with the previously placed Vicryl pursestring stitch.  The abdomen was then insufflated with carbon dioxide without difficulty.  The patient was placed in Trendelenburg position and rotated with the left  side up.  Next, a site was  chosen on the right lower quadrant for placement  of a 5 mm port, and this area was infiltrated with 0.25% Marcaine, and a  small stab incision was made with the 15 blade knife, and a 5 mm port was  placed bluntly through this incision into the abdominal cavity under direct  vision.  A site on the left flank was also chosen for placement of a 10 mm  port.  This area was infiltrated with 0.25% Marcaine, and a small incision  was made with a 15 blade knife, and a 10 mm port was placed bluntly through  this incision into the abdominal cavity under direct vision.  The  laparoscope was then removed to the left flank port.  Using the [facility]  grasper and harmonic scalpel, the left lower quadrant was inspected.  There  were some adhesions of the colon to the anterior abdominal wall which were  very filmy, and were to be taken down very easily with blunt dissection and  sharp dissection with the laparoscopic scissors.  The left colon was then  mobilized by incising its retroperitoneal attachment along  the white line of  Toldt using the harmonic scalpel.  The inflamed segment of colon had some  small bowel fairly densely adherent to it and because of this, it was  decided to stop the laparoscopy at this point.  The infraumbilical incision  was extended inferiorly with a 10 blade knife.  This incision was carried  down through the skin and subcutaneous tissue sharply with the  electrocautery until the fascia of the anterior abdominal wall was  encountered.  This fascia was opened also with the electrocautery, and the  rest of the incision was opened under direct vision with access to the  abdominal cavity after the Hasson cannula was removed.  A Balfour retractor  was then deployed.  The sigmoid colon was readily identifiable.  The segment  of small bowel that was adherent to this was taken down by a combination of  blunt finger dissection and sharp dissection with Metzenbaum scissors until  this  piece of small bowel was completely free.  The small bowel was  inspected closely and did not appear to have any damage.  At this point, a  site was chosen above the inflamed segment of sigmoid colon for division of  the bowel.  The mesentery at this point was opened sharply with the  electrocautery.  An Allen clamp was then placed proximally and the Kocher  clamp distally, and the sigmoid colon was divided between the two.  The  mesentery to this portion of inflamed sigmoid colon was then taken down by  serially clamping with Kelly clamps, dividing, and ligating with 2-0 silk  ties.  Each of the vessels within the mesentery, a couple of the vessels did  require suture ligation with 2-0 silk suture ligatures.  Once this  dissection was carried just inferior to the inflamed segment of colon, it  was noted where the tinea were extending out and becoming confluent,  indicating the upper portion of the rectum and the bowel appeared to be very  healthy at this point.  The mesentery at this point was opened sharply with  the electrocautery.  A Satinsky clamp was placed across the bowel distally  and a Kocher clamp proximally, and bowel was divided along the Satinsky  clamp.  The rest of the mesentery again was clamped with Kelly clamps,  divided, and ligated with 2-0 silk ties.  Once this was accomplished, the  inflamed segment of colon was removed and sent to pathology for further  evaluation.  The operative bed was examined and appeared to be hemostatic.  The 2 segments of proximal and distal colon appeared to easily approximate  each other without any problems or tension.  The anastomosis between the 2  segments was created with interrupted full-thickness 3-0 silk stitches,  keeping the knots on the inside.  The posterior wall was done first,  followed by the anterior wall.  The last several stitches of the anterior wall were thrown in a simple manner with knots on the outside.  Once this  was  accomplished, again the anastomosis appeared to be very healthy and  without any tension.  Gloves were changed at this point.  The abdomen was  then irrigated with copious amounts of saline.  The anastomosis was then  treated with Tisseel.  At this point, the fascia of the anterior abdominal  wall was closed with 2 running #1 PDS sutures.  The subcutaneous tissue was  irrigated with copious amounts of saline and Betadine, and the skin  incisions were all closed with staples.  Sterile dressings were applied.  The patient tolerated the procedure well.  At the end of the case, all  needle, sponge, and instrument counts were correct.  The patient was then  awakened and taken to recovery in stable condition.      Ollen Gross. Vernell Morgans, M.D.  Electronically Signed     PST/MEDQ  D:  10/28/2004  T:  10/28/2004  Job:  [id number]

## 2010-09-21 ENCOUNTER — Other Ambulatory Visit: Payer: Self-pay | Admitting: Infectious Disease

## 2010-09-21 ENCOUNTER — Other Ambulatory Visit: Payer: BC Managed Care – PPO

## 2010-09-21 ENCOUNTER — Other Ambulatory Visit: Payer: Self-pay

## 2010-09-21 DIAGNOSIS — Z79899 Other long term (current) drug therapy: Secondary | ICD-10-CM

## 2010-09-21 DIAGNOSIS — B20 Human immunodeficiency virus [HIV] disease: Secondary | ICD-10-CM

## 2010-09-21 LAB — COMPLETE METABOLIC PANEL WITH GFR
ALT: 25 U/L (ref 0–53)
BUN: 8 mg/dL (ref 6–23)
CO2: 26 mEq/L (ref 19–32)
Calcium: 9.2 mg/dL (ref 8.4–10.5)
Creat: 1.02 mg/dL (ref 0.50–1.35)
GFR, Est African American: >60 mL/min (ref >60)
GFR, Est Non African American: >60 mL/min (ref >60)
Total Bilirubin: 0.7 mg/dL (ref 0.3–1.2)

## 2010-09-21 LAB — CBC WITH DIFFERENTIAL/PLATELET
Eosinophils Absolute: 0.1 10*3/uL (ref 0.0–0.7)
Eosinophils Relative: 2 % (ref 0–5)
HCT: 48.5 % (ref 39.0–52.0)
Lymphs Abs: 1 10*3/uL (ref 0.7–4.0)
MCH: 34.3 pg — ABNORMAL HIGH (ref 26.0–34.0)
MCV: 103.4 fL — ABNORMAL HIGH (ref 78.0–100.0)
Monocytes Absolute: 0.5 10*3/uL (ref 0.1–1.0)
Platelets: 145 10*3/uL — ABNORMAL LOW (ref 150–400)
RDW: 13.5 % (ref 11.5–15.5)

## 2010-09-21 LAB — LIPID PANEL
Cholesterol: 115 mg/dL (ref 0–200)
HDL: 31 mg/dL — ABNORMAL LOW (ref >39)
LDL Cholesterol: 69 mg/dL (ref 0–99)
Triglycerides: 75 mg/dL (ref <150)

## 2010-10-06 ENCOUNTER — Ambulatory Visit (INDEPENDENT_AMBULATORY_CARE_PROVIDER_SITE_OTHER): Payer: BC Managed Care – PPO | Admitting: Infectious Disease

## 2010-10-06 ENCOUNTER — Other Ambulatory Visit: Payer: Self-pay | Admitting: *Deleted

## 2010-10-06 ENCOUNTER — Encounter: Payer: Self-pay | Admitting: Infectious Disease

## 2010-10-06 VITALS — BP 139/81 | HR 66 | Temp 98.4°F | Wt 185.0 lb

## 2010-10-06 DIAGNOSIS — B20 Human immunodeficiency virus [HIV] disease: Secondary | ICD-10-CM

## 2010-10-06 DIAGNOSIS — I10 Essential (primary) hypertension: Secondary | ICD-10-CM

## 2010-10-06 DIAGNOSIS — F17200 Nicotine dependence, unspecified, uncomplicated: Secondary | ICD-10-CM

## 2010-10-06 DIAGNOSIS — D61818 Other pancytopenia: Secondary | ICD-10-CM

## 2010-10-06 DIAGNOSIS — Z79899 Other long term (current) drug therapy: Secondary | ICD-10-CM

## 2010-10-06 DIAGNOSIS — IMO0001 Reserved for inherently not codable concepts without codable children: Secondary | ICD-10-CM

## 2010-10-06 DIAGNOSIS — Z23 Encounter for immunization: Secondary | ICD-10-CM

## 2010-10-06 DIAGNOSIS — N429 Disorder of prostate, unspecified: Secondary | ICD-10-CM

## 2010-10-06 DIAGNOSIS — N400 Enlarged prostate without lower urinary tract symptoms: Secondary | ICD-10-CM

## 2010-10-06 MED ORDER — TAMSULOSIN HCL 0.4 MG PO CAPS: 0.4000 mg | ORAL_CAPSULE | Freq: Every day | ORAL | Status: DC

## 2010-10-06 MED ORDER — DAPSONE 100 MG PO TABS: 100.0000 mg | ORAL_TABLET | Freq: Every day | ORAL | Status: DC

## 2010-10-06 MED ORDER — EFAVIRENZ-EMTRICITAB-TENOFOVIR 600-200-300 MG PO TABS: 1.0000 | ORAL_TABLET | Freq: Every day | ORAL | Status: DC

## 2010-10-06 NOTE — Assessment & Plan Note (Signed)
Have encouraged him to stop smoking have offered smoking cessation assistance

## 2010-10-06 NOTE — Progress Notes (Signed)
  Subjective:    Patient ID: [name], male    DOB: 09/17/1950, 60 y.o.   MRN: [id number]  HPI  60 year old Caucasian male with HIV diagnosed in 2009. He has done well with regards to viral suppression with an undetectable viral load while taking Atripla. Unfortunately CD4 count has never risen much above 150-170. He was last seen in August of 2011 on which point in time we gave him his influenza vaccine. He returns to clinic today. He shortly started smoking once again a half a pack per day. His blood pressure was slightly elevated today in clinic. I've asked him to follow his blood pressures in outpatient by buying a portable blood pressure cuff and monitor his ambulatory blood pressure. Otherwise he is doing relatively well and has no specific complaints today.  Review of Systems  Constitutional: Negative for fever, chills, diaphoresis, activity change, appetite change, fatigue and unexpected weight change.  HENT: Negative for congestion, sore throat, rhinorrhea, sneezing, trouble swallowing and sinus pressure.   Eyes: Negative for photophobia and visual disturbance.  Respiratory: Negative for cough, chest tightness, shortness of breath, wheezing and stridor.   Cardiovascular: Negative for chest pain, palpitations and leg swelling.  Gastrointestinal: Negative for nausea, vomiting, abdominal pain, diarrhea, constipation, blood in stool, abdominal distention and anal bleeding.  Genitourinary: Negative for dysuria, hematuria, flank pain and difficulty urinating.  Musculoskeletal: Negative for myalgias, back pain, joint swelling, arthralgias and gait problem.  Skin: Negative for color change, pallor, rash and wound.  Neurological: Negative for dizziness, tremors, weakness and light-headedness.  Hematological: Negative for adenopathy. Does not bruise/bleed easily.  Psychiatric/Behavioral: Negative for behavioral problems, confusion, sleep disturbance, dysphoric mood, decreased concentration and  agitation.       Objective:   Physical Exam  Constitutional: He is oriented to person, place, and time. He appears well-developed and well-nourished. No distress.  HENT:  Head: Normocephalic and atraumatic.  Mouth/Throat: Oropharynx is clear and moist. No oropharyngeal exudate.  Eyes: Conjunctivae and EOM are normal. Pupils are equal, round, and reactive to light. No scleral icterus.  Neck: Normal range of motion. Neck supple. No JVD present.  Cardiovascular: Normal rate, regular rhythm and normal heart sounds.  Exam reveals no gallop and no friction rub.   No murmur heard. Pulmonary/Chest: Effort normal and breath sounds normal. No respiratory distress. He has no wheezes. He has no rales. He exhibits no tenderness.  Abdominal: He exhibits no distension and no mass. There is no tenderness. There is no rebound and no guarding.  Musculoskeletal: He exhibits no edema and no tenderness.  Lymphadenopathy:    He has no cervical adenopathy.  Neurological: He is alert and oriented to person, place, and time. He has normal reflexes. He exhibits normal muscle tone. Coordination normal.  Skin: Skin is warm and dry. He is not diaphoretic. No erythema. No pallor.  Psychiatric: He has a normal mood and affect. His behavior is normal. Judgment and thought content normal.          Assessment & Plan:  HIV INFECTION Continue Atripla  PANCYTOPENIA Improved on antiviral therapy  HTN (hypertension) Borderline hypertension will ask him to monitor his ambulatory pressures we'll check a microalbumin to creatinine ratio.  Smoking Have encouraged him to stop smoking have offered smoking cessation assistance  BPH (benign prostatic hyperplasia) Daily Flomax

## 2010-10-06 NOTE — Assessment & Plan Note (Signed)
Improved on antiviral therapy

## 2010-10-06 NOTE — Assessment & Plan Note (Signed)
Daily Flomax

## 2010-10-06 NOTE — Assessment & Plan Note (Signed)
Borderline hypertension will ask him to monitor his ambulatory pressures we'll check a microalbumin to creatinine ratio.

## 2010-10-06 NOTE — Assessment & Plan Note (Signed)
Continue Atripla 

## 2010-10-07 LAB — GC/CHLAMYDIA PROBE AMP, URINE
Chlamydia, Swab/Urine, PCR: NEGATIVE
GC Probe Amp, Urine: NEGATIVE

## 2010-10-07 LAB — MICROALBUMIN / CREATININE URINE RATIO
Microalb Creat Ratio: 5.4 mg/g (ref 0.0–30.0)
Microalb, Ur: 0.86 mg/dL (ref 0.00–1.89)

## 2011-04-17 ENCOUNTER — Ambulatory Visit (INDEPENDENT_AMBULATORY_CARE_PROVIDER_SITE_OTHER): Payer: BC Managed Care – PPO | Admitting: Physician Assistant

## 2011-04-17 ENCOUNTER — Other Ambulatory Visit: Payer: Self-pay | Admitting: Infectious Disease

## 2011-04-17 VITALS — BP 147/92 | HR 76 | Temp 99.3°F | Resp 18 | Ht 68.5 in | Wt 188.0 lb

## 2011-04-17 DIAGNOSIS — R509 Fever, unspecified: Secondary | ICD-10-CM

## 2011-04-17 DIAGNOSIS — B9789 Other viral agents as the cause of diseases classified elsewhere: Secondary | ICD-10-CM

## 2011-04-17 DIAGNOSIS — B349 Viral infection, unspecified: Secondary | ICD-10-CM

## 2011-04-17 DIAGNOSIS — B20 Human immunodeficiency virus [HIV] disease: Secondary | ICD-10-CM

## 2011-04-17 LAB — POCT CBC
Granulocyte percent: 64.6 %G (ref 37–80)
HCT, POC: 42.5 % — AB (ref 43.5–53.7)
MPV: 9.8 fL (ref 0–99.8)
POC Granulocyte: 2.7 (ref 2–6.9)
POC LYMPH PERCENT: 28.9 %L (ref 10–50)
POC MID %: 6.5 %M (ref 0–12)
Platelet Count, POC: 0.96 10*3/uL — AB (ref 142–424)
RDW, POC: 13.8 %

## 2011-04-17 LAB — POCT INFLUENZA A/B: Influenza A, POC: NEGATIVE

## 2011-04-17 MED ORDER — AMOXICILLIN 875 MG PO TABS: 875.0000 mg | ORAL_TABLET | Freq: Two times a day (BID) | ORAL | Status: AC

## 2011-04-17 NOTE — Progress Notes (Signed)
  Subjective:    Patient ID: [name], male    DOB: 1950-02-26, 61 y.o.   MRN: [id number]  HPI [name] presents today with 4 days of head congestion, mild cough and 1 day of temp of 101.  No SOB, ST, HA, myalgias.  He is taking allergy med that hasn't helped much.  Smokes 1/2 ppd.  HIV + and doing well. Says his counts are "good" but doesn't remember what they were in August.   Had flu vaccine this season.  Review of Systems As above    Objective:   Physical Exam  Constitutional: He appears well-developed and well-nourished.  HENT:  Right Ear: Tympanic membrane normal.  Left Ear: Tympanic membrane normal.  Nose: Mucosal edema present.  Mouth/Throat: Posterior oropharyngeal erythema present.  Cardiovascular: Normal rate and regular rhythm.   Pulmonary/Chest: Effort normal and breath sounds normal.  Lymphadenopathy:    He has no cervical adenopathy.  Skin: Skin is warm and dry.    Results for orders placed in visit on 04/17/11  POCT INFLUENZA A/B      Component Value Range   Influenza A, POC Negative     Influenza B, POC Negative    POCT CBC      Component Value Range   WBC 4.2 (*) 4.6 - 10.2 (K/uL)   Lymph, poc 1.2  0.6 - 3.4    POC LYMPH PERCENT 28.9  10 - 50 (%L)   MID (cbc) 0.3  0 - 0.9    POC MID % 6.5  0 - 12 (%M)   POC Granulocyte 2.7  2 - 6.9    Granulocyte percent 64.6  37 - 80 (%G)   RBC 4.25 (*) 4.69 - 6.13 (M/uL)   Hemoglobin 13.8 (*) 14.1 - 18.1 (g/dL)   HCT, POC 08.6 (*) 57.8 - 53.7 (%)   MCV 100.0 (*) 80 - 97 (fL)   MCH, POC 32.5 (*) 27 - 31.2 (pg)   MCHC 32.5  31.8 - 35.4 (g/dL)   RDW, POC 46.9     Platelet Count, POC 0.96 (*) 142 - 424 (K/uL)   MPV 9.8  0 - 99.8 (fL)         Assessment & Plan:  Viral Illness, likely but concern for sinusitis Fever HIV  Amoxicillin, mucinex.  Has nasal spray.  Watch for worsening symptoms, fever,chills, sweats, cough.  Call or RTC if worsens.  Discussed with Dr. Cleta Alberts

## 2011-09-29 ENCOUNTER — Other Ambulatory Visit: Payer: Self-pay | Admitting: Infectious Disease

## 2011-09-29 DIAGNOSIS — Z113 Encounter for screening for infections with a predominantly sexual mode of transmission: Secondary | ICD-10-CM

## 2011-10-11 ENCOUNTER — Other Ambulatory Visit: Payer: Self-pay | Admitting: Infectious Disease

## 2011-10-11 ENCOUNTER — Other Ambulatory Visit (INDEPENDENT_AMBULATORY_CARE_PROVIDER_SITE_OTHER): Payer: BC Managed Care – PPO

## 2011-10-11 DIAGNOSIS — Z79899 Other long term (current) drug therapy: Secondary | ICD-10-CM

## 2011-10-11 DIAGNOSIS — B20 Human immunodeficiency virus [HIV] disease: Secondary | ICD-10-CM

## 2011-10-11 DIAGNOSIS — Z113 Encounter for screening for infections with a predominantly sexual mode of transmission: Secondary | ICD-10-CM

## 2011-10-11 LAB — COMPLETE METABOLIC PANEL WITH GFR
ALT: 49 U/L (ref 0–53)
AST: 34 U/L (ref 0–37)
Alkaline Phosphatase: 65 U/L (ref 39–117)
GFR, Est Non African American: 71 mL/min
Sodium: 136 mEq/L (ref 135–145)
Total Bilirubin: 1 mg/dL (ref 0.3–1.2)
Total Protein: 7.4 g/dL (ref 6.0–8.3)

## 2011-10-11 LAB — LIPID PANEL
Cholesterol: 140 mg/dL (ref 0–200)
LDL Cholesterol: 93 mg/dL (ref 0–99)
Triglycerides: 68 mg/dL (ref <150)

## 2011-10-11 LAB — CBC WITH DIFFERENTIAL/PLATELET
Basophils Absolute: 0 10*3/uL (ref 0.0–0.1)
Basophils Relative: 0 % (ref 0–1)
Eosinophils Absolute: 0.1 10*3/uL (ref 0.0–0.7)
MCH: 34.3 pg — ABNORMAL HIGH (ref 26.0–34.0)
MCHC: 34.9 g/dL (ref 30.0–36.0)
Neutrophils Relative %: 60 % (ref 43–77)
Platelets: 157 10*3/uL (ref 150–400)

## 2011-10-11 LAB — RPR

## 2011-10-11 LAB — HEPATITIS B SURFACE ANTIBODY,QUALITATIVE: Hep B S Ab: POSITIVE — AB

## 2011-10-25 ENCOUNTER — Ambulatory Visit (INDEPENDENT_AMBULATORY_CARE_PROVIDER_SITE_OTHER): Payer: BC Managed Care – PPO | Admitting: Infectious Disease

## 2011-10-25 ENCOUNTER — Encounter: Payer: Self-pay | Admitting: Infectious Disease

## 2011-10-25 VITALS — BP 147/87 | HR 78 | Temp 98.1°F | Ht 70.0 in | Wt 194.0 lb

## 2011-10-25 DIAGNOSIS — B20 Human immunodeficiency virus [HIV] disease: Secondary | ICD-10-CM

## 2011-10-25 DIAGNOSIS — N400 Enlarged prostate without lower urinary tract symptoms: Secondary | ICD-10-CM

## 2011-10-25 DIAGNOSIS — F17200 Nicotine dependence, unspecified, uncomplicated: Secondary | ICD-10-CM

## 2011-10-25 MED ORDER — ELVITEG-COBIC-EMTRICIT-TENOFDF 150-150-200-300 MG PO TABS: 1.0000 | ORAL_TABLET | Freq: Every day | ORAL | Status: DC

## 2011-10-25 NOTE — Assessment & Plan Note (Signed)
Continue flomax  

## 2011-10-25 NOTE — Assessment & Plan Note (Addendum)
Change to STRIBILD, Bring back in 4 months to recheck labs. DC dapsone.

## 2011-10-25 NOTE — Progress Notes (Signed)
  Subjective:    Patient ID: [name], male    DOB: 1950-11-09, 61 y.o.   MRN: [id number]  HPI  [name] is a 61 y.o. male who is doing superbly well on his  antiviral regimen, atripla with undetectable viral load and health cd4 count now above 300.  He had changed atripla to daytime because he disliked the vivid dreams. He does feel a bit foggy. After further discussion we decided to change him to [name].     Review of Systems  Constitutional: Negative for fever, chills, diaphoresis, activity change, appetite change, fatigue and unexpected weight change.  HENT: Negative for congestion, sore throat, rhinorrhea, sneezing, trouble swallowing and sinus pressure.   Eyes: Negative for photophobia and visual disturbance.  Respiratory: Negative for cough, chest tightness, shortness of breath, wheezing and stridor.   Cardiovascular: Negative for chest pain, palpitations and leg swelling.  Gastrointestinal: Negative for nausea, vomiting, abdominal pain, diarrhea, constipation, blood in stool, abdominal distention and anal bleeding.  Genitourinary: Negative for dysuria, hematuria, flank pain and difficulty urinating.  Musculoskeletal: Negative for myalgias, back pain, joint swelling, arthralgias and gait problem.  Skin: Negative for color change, pallor, rash and wound.  Neurological: Negative for dizziness, tremors, weakness and light-headedness.  Hematological: Negative for adenopathy. Does not bruise/bleed easily.  Psychiatric/Behavioral: Positive for decreased concentration. Negative for behavioral problems, confusion, disturbed wake/sleep cycle, dysphoric mood and agitation.       Objective:   Physical Exam  Constitutional: He is oriented to person, place, and time. He appears well-developed and well-nourished. No distress.  HENT:  Head: Normocephalic and atraumatic.  Mouth/Throat: Oropharynx is clear and moist. No oropharyngeal exudate.  Eyes: Conjunctivae normal and EOM are  normal. Pupils are equal, round, and reactive to light. No scleral icterus.  Neck: Normal range of motion. Neck supple. No JVD present.  Cardiovascular: Normal rate, regular rhythm and normal heart sounds.  Exam reveals no gallop and no friction rub.   No murmur heard. Pulmonary/Chest: Effort normal and breath sounds normal. No respiratory distress. He has no wheezes. He has no rales. He exhibits no tenderness.  Abdominal: He exhibits no distension and no mass. There is no tenderness. There is no rebound and no guarding.  Musculoskeletal: He exhibits no edema and no tenderness.  Lymphadenopathy:    He has no cervical adenopathy.  Neurological: He is alert and oriented to person, place, and time. He has normal reflexes. He exhibits normal muscle tone. Coordination normal.  Skin: Skin is warm and dry. He is not diaphoretic. No erythema. No pallor.  Psychiatric: He has a normal mood and affect. His behavior is normal. Judgment and thought content normal.          Assessment & Plan:  HIV INFECTION Change to STRIBILD, Bring back in 4 months to recheck labs. DC dapsone.  BPH (benign prostatic hyperplasia) Continue flomax  Smoking enccouraged to stop smoking

## 2011-10-25 NOTE — Assessment & Plan Note (Signed)
enccouraged to stop smoking

## 2011-12-07 ENCOUNTER — Other Ambulatory Visit: Payer: Self-pay | Admitting: Infectious Disease

## 2011-12-10 ENCOUNTER — Other Ambulatory Visit: Payer: Self-pay | Admitting: Infectious Disease

## 2012-02-14 ENCOUNTER — Other Ambulatory Visit (INDEPENDENT_AMBULATORY_CARE_PROVIDER_SITE_OTHER): Payer: BC Managed Care – PPO

## 2012-02-14 DIAGNOSIS — B20 Human immunodeficiency virus [HIV] disease: Secondary | ICD-10-CM

## 2012-02-14 LAB — CBC WITH DIFFERENTIAL/PLATELET
Eosinophils Absolute: 0.1 10*3/uL (ref 0.0–0.7)
Hemoglobin: 17 g/dL (ref 13.0–17.0)
Lymphocytes Relative: 17 % (ref 12–46)
Lymphs Abs: 0.9 10*3/uL (ref 0.7–4.0)
MCH: 32.3 pg (ref 26.0–34.0)
Monocytes Relative: 6 % (ref 3–12)
Neutro Abs: 4.3 10*3/uL (ref 1.7–7.7)
Neutrophils Relative %: 75 % (ref 43–77)
Platelets: 162 10*3/uL (ref 150–400)
RBC: 5.26 MIL/uL (ref 4.22–5.81)
WBC: 5.6 10*3/uL (ref 4.0–10.5)

## 2012-02-14 LAB — RPR

## 2012-02-15 LAB — LIPID PANEL
Cholesterol: 108 mg/dL (ref 0–200)
HDL: 29 mg/dL — ABNORMAL LOW (ref >39)
Total CHOL/HDL Ratio: 3.7 Ratio
Triglycerides: 137 mg/dL (ref <150)

## 2012-02-15 LAB — COMPLETE METABOLIC PANEL WITH GFR
ALT: 17 U/L (ref 0–53)
BUN: 16 mg/dL (ref 6–23)
CO2: 22 mEq/L (ref 19–32)
Creat: 1.33 mg/dL (ref 0.50–1.35)
GFR, Est African American: 67 mL/min
Total Bilirubin: 0.5 mg/dL (ref 0.3–1.2)

## 2012-02-15 LAB — HIV-1 RNA QUANT-NO REFLEX-BLD: HIV-1 RNA Quant, Log: <1.3 {Log} (ref <1.30)

## 2012-03-02 ENCOUNTER — Ambulatory Visit (INDEPENDENT_AMBULATORY_CARE_PROVIDER_SITE_OTHER): Payer: BC Managed Care – PPO | Admitting: Infectious Disease

## 2012-03-02 ENCOUNTER — Encounter: Payer: Self-pay | Admitting: Infectious Disease

## 2012-03-02 ENCOUNTER — Other Ambulatory Visit: Payer: Self-pay | Admitting: *Deleted

## 2012-03-02 VITALS — BP 145/82 | HR 69 | Temp 98.1°F | Ht 71.0 in | Wt 193.0 lb

## 2012-03-02 DIAGNOSIS — B20 Human immunodeficiency virus [HIV] disease: Secondary | ICD-10-CM

## 2012-03-02 DIAGNOSIS — N400 Enlarged prostate without lower urinary tract symptoms: Secondary | ICD-10-CM

## 2012-03-02 DIAGNOSIS — N529 Male erectile dysfunction, unspecified: Secondary | ICD-10-CM | POA: Insufficient documentation

## 2012-03-02 DIAGNOSIS — H1010 Acute atopic conjunctivitis, unspecified eye: Secondary | ICD-10-CM

## 2012-03-02 DIAGNOSIS — H1045 Other chronic allergic conjunctivitis: Secondary | ICD-10-CM

## 2012-03-02 DIAGNOSIS — F17200 Nicotine dependence, unspecified, uncomplicated: Secondary | ICD-10-CM

## 2012-03-02 DIAGNOSIS — I10 Essential (primary) hypertension: Secondary | ICD-10-CM

## 2012-03-02 DIAGNOSIS — N5319 Other ejaculatory dysfunction: Secondary | ICD-10-CM

## 2012-03-02 DIAGNOSIS — N508 Other specified disorders of male genital organs: Secondary | ICD-10-CM

## 2012-03-02 MED ORDER — OLOPATADINE HCL 0.1 % OP SOLN: 1.0000 [drp] | Freq: Two times a day (BID) | OPHTHALMIC | Status: DC

## 2012-03-02 MED ORDER — ELVITEG-COBIC-EMTRICIT-TENOFDF 150-150-200-300 MG PO TABS: 1.0000 | ORAL_TABLET | Freq: Every day | ORAL | Status: DC

## 2012-03-02 MED ORDER — SILDENAFIL CITRATE 100 MG PO TABS: 100.0000 mg | ORAL_TABLET | Freq: Every day | ORAL | Status: DC | PRN

## 2012-03-02 NOTE — Progress Notes (Signed)
Subjective:    Patient ID: [name], male    DOB: 1950-02-05, 62 y.o.   MRN: [id number]  HPI  39 -year-old Caucasian male with HIV that has been superbly well controlled on Atripla now STRIBILD returns for clinic.  He has several concerns today:  #1 he states he continues to suffer from erectile dysfunction which is been helped by use of Viagra that his friend provided him with.   #2 he states that his ejaculate now is more clear appearing in no longer contains a more milky consistency that he is used to and he believes this may be related to the [facility], We asked alternative antiretroviral medications including complera, tivicay with truvada and he preferred to stay on Stribild. I am ordering serum testosterone level and offered referral to urologist but he refused this point time.  #3 he has had problems with drainage in his left thigh which causes eyelids shut when he awakens in the morning. This is same I worry is been using a contact lens to read. He is concerned he'll have a bacterial infection. I assured him that this is more consistent with an allergic conjunctivitis.  I spent greater than 45 minutes with the patient including greater than 50% of time in face to face counsel of the patient and in coordination of their care.   Review of Systems  Constitutional: Negative for fever, chills, diaphoresis, activity change, appetite change, fatigue and unexpected weight change.  HENT: Negative for congestion, sore throat, rhinorrhea, sneezing, trouble swallowing and sinus pressure.   Eyes: Positive for discharge and itching. Negative for photophobia and visual disturbance.  Respiratory: Negative for cough, chest tightness, shortness of breath, wheezing and stridor.   Cardiovascular: Negative for chest pain, palpitations and leg swelling.  Gastrointestinal: Negative for nausea, vomiting, abdominal pain, diarrhea, constipation, blood in stool, abdominal distention and anal bleeding.    Genitourinary: Negative for dysuria, hematuria, flank pain and difficulty urinating.  Musculoskeletal: Negative for myalgias, back pain, joint swelling, arthralgias and gait problem.  Skin: Negative for color change, pallor, rash and wound.  Neurological: Negative for dizziness, tremors, weakness and light-headedness.  Hematological: Negative for adenopathy. Does not bruise/bleed easily.  Psychiatric/Behavioral: Negative for behavioral problems, confusion, sleep disturbance, dysphoric mood, decreased concentration and agitation.       Objective:   Physical Exam  Constitutional: He is oriented to person, place, and time. He appears well-developed and well-nourished. No distress.  HENT:  Head: Normocephalic and atraumatic.  Mouth/Throat: Oropharynx is clear and moist. No oropharyngeal exudate.  Eyes: Conjunctivae normal and EOM are normal. Pupils are equal, round, and reactive to light. No scleral icterus.    Neck: Normal range of motion. Neck supple. No JVD present.  Cardiovascular: Normal rate, regular rhythm and normal heart sounds.  Exam reveals no gallop and no friction rub.   No murmur heard. Pulmonary/Chest: Effort normal and breath sounds normal. No respiratory distress. He has no wheezes. He has no rales. He exhibits no tenderness.  Abdominal: He exhibits no distension and no mass. There is no tenderness. There is no rebound and no guarding.  Musculoskeletal: He exhibits no edema and no tenderness.  Lymphadenopathy:    He has no cervical adenopathy.  Neurological: He is alert and oriented to person, place, and time. He has normal reflexes. He exhibits normal muscle tone. Coordination normal.  Skin: Skin is warm and dry. He is not diaphoretic. No erythema. No pallor.  Psychiatric: He has a normal mood and affect. His behavior  is normal. Judgment and thought content normal.          Assessment & Plan:  HIV patient wishes to remain on current regimen we'll recheck labs in 6  months time and then in the year.  Slight rise in creatinine: This appears consistent with that do to inhibition of secretion of creatinine at the distal tubules by cobisistat, will recheck labs today  Erectile dysfunction: We'll check serum testosterone if low consider repletion but would like to also recheck a prostate-specific antigen prior to initiating testosterone therapy. Offered referral to urologist but he refused. I will give him viagra script and he will split pill in half. I am ok with rx as long as he remains suppressed and uses condoms  Abnormal ejaculation: Again offered referral to urologist I doubt this is due to his antiretroviral and offered to change the medicines but he did not want to do so today.  Hypertension: Patient may need other drug added for blood pressure control.  Smoking urgent stop smoking and pelvis overall health prevascular health and mentioned that continued use could be contributing to his ED  BPH: Continue Flomax.  Allergic conjunctivitis: Asked him to stop wearing the contact lens and to use Patanol eyedrops twice daily for 6 weeks.

## 2012-05-09 ENCOUNTER — Encounter: Payer: Self-pay | Admitting: *Deleted

## 2012-12-07 ENCOUNTER — Other Ambulatory Visit: Payer: Self-pay

## 2012-12-08 ENCOUNTER — Telehealth: Payer: Self-pay | Admitting: *Deleted

## 2012-12-08 NOTE — Telephone Encounter (Signed)
Requested pt call [facility] to schedule appts.

## 2013-01-01 ENCOUNTER — Other Ambulatory Visit: Payer: BC Managed Care – PPO

## 2013-01-01 ENCOUNTER — Other Ambulatory Visit: Payer: Self-pay | Admitting: Infectious Disease

## 2013-01-01 DIAGNOSIS — B20 Human immunodeficiency virus [HIV] disease: Secondary | ICD-10-CM

## 2013-01-01 LAB — CBC WITH DIFFERENTIAL/PLATELET
Basophils Absolute: 0 10*3/uL (ref 0.0–0.1)
Eosinophils Relative: 1 % (ref 0–5)
Lymphocytes Relative: 22 % (ref 12–46)
MCV: 91.8 fL (ref 78.0–100.0)
Neutro Abs: 4.2 10*3/uL (ref 1.7–7.7)
Neutrophils Relative %: 70 % (ref 43–77)
Platelets: 173 10*3/uL (ref 150–400)
RBC: 5.61 MIL/uL (ref 4.22–5.81)
RDW: 14.2 % (ref 11.5–15.5)
WBC: 6.1 10*3/uL (ref 4.0–10.5)

## 2013-01-01 LAB — COMPLETE METABOLIC PANEL WITH GFR
Albumin: 4.6 g/dL (ref 3.5–5.2)
BUN: 11 mg/dL (ref 6–23)
CO2: 27 mEq/L (ref 19–32)
Calcium: 9.9 mg/dL (ref 8.4–10.5)
Chloride: 99 mEq/L (ref 96–112)
Creat: 1.28 mg/dL (ref 0.50–1.35)
GFR, Est African American: 69 mL/min
GFR, Est Non African American: 60 mL/min
Glucose, Bld: 98 mg/dL (ref 70–99)
Potassium: 4.9 mEq/L (ref 3.5–5.3)

## 2013-01-01 LAB — LIPID PANEL
Cholesterol: 144 mg/dL (ref 0–200)
HDL: 33 mg/dL — ABNORMAL LOW (ref >39)

## 2013-01-02 LAB — T-HELPER CELL (CD4) - (RCID CLINIC ONLY): CD4 T Cell Abs: 300 /uL — ABNORMAL LOW (ref 400–2700)

## 2013-01-15 ENCOUNTER — Encounter: Payer: Self-pay | Admitting: Infectious Disease

## 2013-01-15 ENCOUNTER — Ambulatory Visit (INDEPENDENT_AMBULATORY_CARE_PROVIDER_SITE_OTHER): Payer: BC Managed Care – PPO | Admitting: Infectious Disease

## 2013-01-15 VITALS — BP 147/87 | HR 84 | Temp 97.8°F | Wt 189.0 lb

## 2013-01-15 DIAGNOSIS — I10 Essential (primary) hypertension: Secondary | ICD-10-CM

## 2013-01-15 DIAGNOSIS — N529 Male erectile dysfunction, unspecified: Secondary | ICD-10-CM

## 2013-01-15 DIAGNOSIS — N400 Enlarged prostate without lower urinary tract symptoms: Secondary | ICD-10-CM

## 2013-01-15 DIAGNOSIS — Z1211 Encounter for screening for malignant neoplasm of colon: Secondary | ICD-10-CM

## 2013-01-15 DIAGNOSIS — F17200 Nicotine dependence, unspecified, uncomplicated: Secondary | ICD-10-CM

## 2013-01-15 DIAGNOSIS — J069 Acute upper respiratory infection, unspecified: Secondary | ICD-10-CM

## 2013-01-15 DIAGNOSIS — B20 Human immunodeficiency virus [HIV] disease: Secondary | ICD-10-CM

## 2013-01-15 MED ORDER — VARENICLINE TARTRATE 1 MG PO TABS: 1.0000 mg | ORAL_TABLET | Freq: Two times a day (BID) | ORAL | Status: DC

## 2013-01-15 MED ORDER — VARENICLINE TARTRATE 0.5 MG X 11 & 1 MG X 42 PO MISC: ORAL | Status: DC

## 2013-01-15 MED ORDER — TAMSULOSIN HCL 0.4 MG PO CAPS: 0.4000 mg | ORAL_CAPSULE | Freq: Every day | ORAL | Status: DC

## 2013-01-15 NOTE — Progress Notes (Signed)
Subjective:    Patient ID: [name], male    DOB: 1950/07/01, 62 y.o.   MRN: [id number]  URI  This is a new problem. The current episode started in the past 7 days. The problem has been gradually worsening. There has been no fever. Associated symptoms include congestion, coughing, rhinorrhea and sneezing. Pertinent negatives include no abdominal pain, chest pain, diarrhea, dysuria, nausea, rash, sore throat, vomiting or wheezing. He has tried decongestant for the symptoms. The treatment provided no relief.    62 -year-old Caucasian male with HIV that has been superbly well controlled on Atripla now STRIBILD returns for clinic with new acute complaint of sinus congestion and productive cough. He has tried Mucinex as well as delsym that much relief. He has had no fevers chills malaise nausea or vomiting.  He has no specific sick contacts. He is not known to be asthmatic he is still a smoker.  He still does not have a primary care physician he states stating that he goes to the urgent care if he has problems.   Review of Systems  Constitutional: Negative for fever, chills, diaphoresis, activity change, appetite change, fatigue and unexpected weight change.  HENT: Positive for congestion, postnasal drip, rhinorrhea and sneezing. Negative for sinus pressure, sore throat and trouble swallowing.   Eyes: Negative for photophobia, discharge, itching and visual disturbance.  Respiratory: Positive for cough. Negative for chest tightness, shortness of breath, wheezing and stridor.   Cardiovascular: Negative for chest pain, palpitations and leg swelling.  Gastrointestinal: Negative for nausea, vomiting, abdominal pain, diarrhea, constipation, blood in stool, abdominal distention and anal bleeding.  Genitourinary: Negative for dysuria, hematuria, flank pain and difficulty urinating.  Musculoskeletal: Negative for arthralgias, back pain, gait problem, joint swelling and myalgias.  Skin: Negative for  color change, pallor, rash and wound.  Neurological: Negative for dizziness, tremors, weakness and light-headedness.  Hematological: Negative for adenopathy. Does not bruise/bleed easily.  Psychiatric/Behavioral: Negative for behavioral problems, confusion, sleep disturbance, dysphoric mood, decreased concentration and agitation.       Objective:   Physical Exam  Constitutional: He is oriented to person, place, and time. He appears well-developed and well-nourished. No distress.  HENT:  Head: Normocephalic and atraumatic.  Mouth/Throat: Uvula is midline. Posterior oropharyngeal edema and posterior oropharyngeal erythema present. No oropharyngeal exudate.  Eyes: Conjunctivae and EOM are normal. Pupils are equal, round, and reactive to light. No scleral icterus.  Neck: Normal range of motion. Neck supple. No JVD present.  Cardiovascular: Normal rate, regular rhythm and normal heart sounds.  Exam reveals no gallop and no friction rub.   No murmur heard. Pulmonary/Chest: Effort normal and breath sounds normal. No respiratory distress. He has no wheezes. He has no rales. He exhibits no tenderness.  Abdominal: He exhibits no distension and no mass. There is no tenderness. There is no rebound and no guarding.  Musculoskeletal: He exhibits no edema and no tenderness.  Lymphadenopathy:    He has no cervical adenopathy.  Neurological: He is alert and oriented to person, place, and time. He has normal reflexes. He exhibits normal muscle tone. Coordination normal.  Skin: Skin is warm and dry. He is not diaphoretic. No erythema. No pallor.  Psychiatric: He has a normal mood and affect. His behavior is normal. Judgment and thought content normal.          Assessment & Plan:  Upper respiratory tract infection: Very likely viral do not think he has influenza.  Continue supportive care and I've encouraged him  to use more potent decongestants as well as intranasal Afrin.  This is a continuing  problem that he has may want to consider giving short courses of fluticasone inhaled as well. I see no need for antivirals or antibiotics at this point.   HIV patient wishes to remain on current regimen we'll recheck labs in 6 months time and then in the year.  Smoking: I spent greater than 3 minutes talking to patient about smoking cessation. Would've her various options including Chantix which we'll initiate with him. I gave him warnings about risk for depressive symptoms on this medication.   Hypertension: Patient may need other drug added for blood pressure control, besides Flomax.   BPH: Continue Flomax.  Erectile dysfunction still taking Viagra time to time  : Cancer screening we'll refer him to GI for colonoscopy

## 2013-03-26 ENCOUNTER — Telehealth: Payer: Self-pay | Admitting: *Deleted

## 2013-03-26 DIAGNOSIS — B20 Human immunodeficiency virus [HIV] disease: Secondary | ICD-10-CM

## 2013-03-26 MED ORDER — ELVITEG-COBIC-EMTRICIT-TENOFDF 150-150-200-300 MG PO TABS: 1.0000 | ORAL_TABLET | Freq: Every day | ORAL | Status: DC

## 2013-03-26 NOTE — Telephone Encounter (Signed)
rx refilled.

## 2013-05-21 ENCOUNTER — Other Ambulatory Visit: Payer: Self-pay | Admitting: *Deleted

## 2013-05-21 DIAGNOSIS — F17200 Nicotine dependence, unspecified, uncomplicated: Secondary | ICD-10-CM

## 2013-05-21 MED ORDER — VARENICLINE TARTRATE 1 MG PO TABS: 1.0000 mg | ORAL_TABLET | Freq: Two times a day (BID) | ORAL | Status: DC

## 2013-07-07 ENCOUNTER — Other Ambulatory Visit: Payer: Self-pay | Admitting: Infectious Disease

## 2013-07-16 ENCOUNTER — Other Ambulatory Visit: Payer: Self-pay

## 2013-07-17 ENCOUNTER — Other Ambulatory Visit: Payer: Self-pay | Admitting: Licensed Clinical Social Worker

## 2013-07-17 ENCOUNTER — Telehealth: Payer: Self-pay | Admitting: Licensed Clinical Social Worker

## 2013-07-17 DIAGNOSIS — N529 Male erectile dysfunction, unspecified: Secondary | ICD-10-CM

## 2013-07-17 MED ORDER — SILDENAFIL CITRATE 100 MG PO TABS: 100.0000 mg | ORAL_TABLET | Freq: Every day | ORAL | Status: DC | PRN

## 2013-07-17 NOTE — Telephone Encounter (Signed)
Patient wanted refill for viagra but does not plan on coming in until December because he said that is what he discussed with Dr. Tommy Medal on the last visit due to insurance. Please advise

## 2013-07-17 NOTE — Telephone Encounter (Signed)
rx called in

## 2013-07-17 NOTE — Telephone Encounter (Signed)
He has been undetectable. Fine to fill. He should still use condoms to prevent 2nd HIV infection and all the damn syphilis GC, chlamydia going round

## 2013-08-20 ENCOUNTER — Other Ambulatory Visit: Payer: BC Managed Care – PPO

## 2013-08-20 DIAGNOSIS — B20 Human immunodeficiency virus [HIV] disease: Secondary | ICD-10-CM

## 2013-08-20 DIAGNOSIS — Z113 Encounter for screening for infections with a predominantly sexual mode of transmission: Secondary | ICD-10-CM

## 2013-08-20 LAB — CBC WITH DIFFERENTIAL/PLATELET
Basophils Absolute: 0 10*3/uL (ref 0.0–0.1)
Basophils Relative: 0 % (ref 0–1)
EOS ABS: 0 10*3/uL (ref 0.0–0.7)
EOS PCT: 1 % (ref 0–5)
HCT: 50.1 % (ref 39.0–52.0)
HEMOGLOBIN: 18.3 g/dL — AB (ref 13.0–17.0)
LYMPHS ABS: 1.3 10*3/uL (ref 0.7–4.0)
Lymphocytes Relative: 26 % (ref 12–46)
MCH: 33.4 pg (ref 26.0–34.0)
MCHC: 36.5 g/dL — ABNORMAL HIGH (ref 30.0–36.0)
MCV: 91.4 fL (ref 78.0–100.0)
MONOS PCT: 9 % (ref 3–12)
Monocytes Absolute: 0.4 10*3/uL (ref 0.1–1.0)
NEUTROS PCT: 64 % (ref 43–77)
Neutro Abs: 3.1 10*3/uL (ref 1.7–7.7)
Platelets: 152 10*3/uL (ref 150–400)
RBC: 5.48 MIL/uL (ref 4.22–5.81)
RDW: 14.2 % (ref 11.5–15.5)
WBC: 4.9 10*3/uL (ref 4.0–10.5)

## 2013-08-20 LAB — COMPLETE METABOLIC PANEL WITH GFR
ALK PHOS: 61 U/L (ref 39–117)
ALT: 40 U/L (ref 0–53)
AST: 28 U/L (ref 0–37)
Albumin: 4.2 g/dL (ref 3.5–5.2)
BILIRUBIN TOTAL: 0.9 mg/dL (ref 0.2–1.2)
BUN: 12 mg/dL (ref 6–23)
CO2: 27 mEq/L (ref 19–32)
CREATININE: 1.2 mg/dL (ref 0.50–1.35)
Calcium: 9 mg/dL (ref 8.4–10.5)
Chloride: 104 mEq/L (ref 96–112)
GFR, Est African American: 75 mL/min
GFR, Est Non African American: 65 mL/min
Glucose, Bld: 92 mg/dL (ref 70–99)
Potassium: 4.9 mEq/L (ref 3.5–5.3)
Sodium: 138 mEq/L (ref 135–145)
Total Protein: 6.8 g/dL (ref 6.0–8.3)

## 2013-08-20 LAB — RPR

## 2013-08-21 LAB — MICROALBUMIN / CREATININE URINE RATIO
Creatinine, Urine: 171.6 mg/dL
MICROALB UR: 0.53 mg/dL (ref 0.00–1.89)
MICROALB/CREAT RATIO: 3.1 mg/g (ref 0.0–30.0)

## 2013-08-21 LAB — T-HELPER CELL (CD4) - (RCID CLINIC ONLY)
CD4 T CELL HELPER: 21 % — AB (ref 33–55)
CD4 T Cell Abs: 260 /uL — ABNORMAL LOW (ref 400–2700)

## 2013-08-25 LAB — HIV-1 RNA QUANT-NO REFLEX-BLD

## 2014-01-14 ENCOUNTER — Ambulatory Visit (INDEPENDENT_AMBULATORY_CARE_PROVIDER_SITE_OTHER): Payer: BC Managed Care – PPO | Admitting: Urgent Care

## 2014-01-14 VITALS — BP 122/76 | HR 75 | Temp 99.0°F | Resp 16 | Ht 69.25 in | Wt 187.8 lb

## 2014-01-14 DIAGNOSIS — H1032 Unspecified acute conjunctivitis, left eye: Secondary | ICD-10-CM

## 2014-01-14 MED ORDER — OFLOXACIN 0.3 % OP SOLN: 1.0000 [drp] | OPHTHALMIC | Status: AC

## 2014-01-14 NOTE — Progress Notes (Signed)
    MRN: [id number] DOB: [birth year]  Subjective:   [name] is a 63 y.o. male presenting for left red eye since this am. Last night patient started using new contacts, which are supposed to be worn day and night for 1 month. This morning, patient woke up with a mildly matted left eye. Now has watery and red left eye with mild green drainage, some blurred vision in affected eye. Denies fevers, chills, headaches, photophobia, pain with eye movement, eye trauma, headache, sinus pain, rhinorrhea, ear pain, ear drainage, sore throat, difficulty swallowing. Patient is a Dance movement psychotherapist at [employer] but otherwise no sick contacts. Denies any other aggravating or relieving factors, no other questions or concerns.  Jaryd has a current medication list which includes the following prescription(s): elvitegravir-cobicistat-emtricitabine-tenofovir, trubiotics, sf 5000 plus, sildenafil, tamsulosin, and varenicline.  Has has No Known Allergies.  Yaacov  has a past medical history of HIV infection and Erectile dysfunction. Also  has past surgical history that includes Partial colectomy.  ROS As in subjective.  Objective:   Vitals: BP 122/76 mmHg  Pulse 75  Temp(Src) 99 F (37.2 C) (Oral)  Resp 16  Ht 5' 9.25" (1.759 m)  Wt 187 lb 12.8 oz (85.186 kg)  BMI 27.53 kg/m2  SpO2 97%  Physical Exam  Constitutional: He is oriented to person, place, and time and well-developed, well-nourished, and in no distress.  Eyes: EOM are normal. Pupils are equal, round, and reactive to light. Left eye exhibits discharge (mild thick green discharge). Right conjunctiva is not injected. Left conjunctiva is injected. No scleral icterus.  Cardiovascular: Normal rate.   Pulmonary/Chest: Effort normal.  Neurological: He is alert and oriented to person, place, and time.  Skin: Skin is warm and dry. No rash noted. He is not diaphoretic. No erythema.  Psychiatric: Mood and affect normal.    Assessment and Plan :   1. Acute  conjunctivitis of left eye - likely bacterial in etiology, start ofloxacin (OCUFLOX) 0.3 % ophthalmic solution; Place 1 drop into the left eye every 2 (two) hours.  Dispense: 5 mL; Refill: 0 - advised patient to discontinue contacts until 24 hours after resolution of red eye, must use new contact and contact lens case - return to clinic if symptoms worsen, fail to resolve or as needed   Jaynee Eagles, [name] [facility] [phone] 01/14/2014 3:54 PM

## 2014-01-14 NOTE — Patient Instructions (Addendum)
Please use Ocuflox every 2 hours for the first 2 days, then every 4 hours through Day 7.   Bacterial Conjunctivitis Bacterial conjunctivitis, commonly called pink eye, is an inflammation of the clear membrane that covers the white part of the eye (conjunctiva). The inflammation can also happen on the underside of the eyelids. The blood vessels in the conjunctiva become inflamed, causing the eye to become red or pink. Bacterial conjunctivitis may spread easily from one eye to another and from person to person (contagious).  CAUSES  Bacterial conjunctivitis is caused by bacteria. The bacteria may come from your own skin, your upper respiratory tract, or from someone else with bacterial conjunctivitis. SYMPTOMS  The normally white color of the eye or the underside of the eyelid is usually pink or red. The pink eye is usually associated with irritation, tearing, and some sensitivity to light. Bacterial conjunctivitis is often associated with a thick, yellowish discharge from the eye. The discharge may turn into a crust on the eyelids overnight, which causes your eyelids to stick together. If a discharge is present, there may also be some blurred vision in the affected eye. DIAGNOSIS  Bacterial conjunctivitis is diagnosed by your caregiver through an eye exam and the symptoms that you report. Your caregiver looks for changes in the surface tissues of your eyes, which may point to the specific type of conjunctivitis. A sample of any discharge may be collected on a cotton-tip swab if you have a severe case of conjunctivitis, if your cornea is affected, or if you keep getting repeat infections that do not respond to treatment. The sample will be sent to a lab to see if the inflammation is caused by a bacterial infection and to see if the infection will respond to antibiotic medicines. TREATMENT   Bacterial conjunctivitis is treated with antibiotics. Antibiotic eyedrops are most often used. However, antibiotic  ointments are also available. Antibiotics pills are sometimes used. Artificial tears or eye washes may ease discomfort. HOME CARE INSTRUCTIONS   To ease discomfort, apply a cool, clean washcloth to your eye for 10-20 minutes, 3-4 times a day.  Gently wipe away any drainage from your eye with a warm, wet washcloth or a cotton ball.  Wash your hands often with soap and water. Use paper towels to dry your hands.  Do not share towels or washcloths. This may spread the infection.  Change or wash your pillowcase every day.  You should not use eye makeup until the infection is gone.  Do not operate machinery or drive if your vision is blurred.  Stop using contact lenses. Ask your caregiver how to sterilize or replace your contacts before using them again. This depends on the type of contact lenses that you use.  When applying medicine to the infected eye, do not touch the edge of your eyelid with the eyedrop bottle or ointment tube. SEEK IMMEDIATE MEDICAL CARE IF:   Your infection has not improved within 3 days after beginning treatment.  You had yellow discharge from your eye and it returns.  You have increased eye pain.  Your eye redness is spreading.  Your vision becomes blurred.  You have a fever or persistent symptoms for more than 2-3 days.  You have a fever and your symptoms suddenly get worse.  You have facial pain, redness, or swelling. MAKE SURE YOU:   Understand these instructions.  Will watch your condition.  Will get help right away if you are not doing well or get worse. Document  Released: 01/18/2005 Document Revised: 06/04/2013 Document Reviewed: 06/21/2011 [facility] Patient Information 2015 [place], [place]. This information is not intended to replace advice given to you by your health care provider. Make sure you discuss any questions you have with your health care provider.

## 2014-01-17 ENCOUNTER — Telehealth: Payer: Self-pay

## 2014-01-17 DIAGNOSIS — H109 Unspecified conjunctivitis: Secondary | ICD-10-CM

## 2014-01-17 MED ORDER — TOBRAMYCIN 0.3 % OP SOLN: 2.0000 [drp] | OPHTHALMIC | Status: AC

## 2014-01-17 NOTE — Telephone Encounter (Signed)
Spoke with patient by phone. Agreed to switch to tobramycin which was sent to his pharmacy. Will attempt 2 more days of treatment, if no improvement of presumed bacterial conjunctivitis, please refer to ophthalmologist.  Jaynee Eagles, [name] [facility] [phone] 01/17/2014  6:27 PM

## 2014-01-17 NOTE — Telephone Encounter (Signed)
Ronalee Belts, pt called and stated that when he was here at [facility] for his eye infection you Rxd an antibiotic drop and told him to CB if it is not improving and you will send in something stronger. Pt reported that it is no better, still read and purulent (no worse, just not better). Please send in new Rx, and pt would like to be notified on his cell # [phone].

## 2014-02-04 ENCOUNTER — Other Ambulatory Visit: Payer: BLUE CROSS/BLUE SHIELD

## 2014-02-04 DIAGNOSIS — Z79899 Other long term (current) drug therapy: Secondary | ICD-10-CM

## 2014-02-04 DIAGNOSIS — B20 Human immunodeficiency virus [HIV] disease: Secondary | ICD-10-CM

## 2014-02-04 DIAGNOSIS — Z113 Encounter for screening for infections with a predominantly sexual mode of transmission: Secondary | ICD-10-CM

## 2014-02-04 LAB — COMPLETE METABOLIC PANEL WITH GFR
ALT: 36 U/L (ref 0–53)
AST: 25 U/L (ref 0–37)
Albumin: 4.3 g/dL (ref 3.5–5.2)
Alkaline Phosphatase: 67 U/L (ref 39–117)
BUN: 18 mg/dL (ref 6–23)
CHLORIDE: 101 meq/L (ref 96–112)
CO2: 24 mEq/L (ref 19–32)
Calcium: 9.2 mg/dL (ref 8.4–10.5)
Creat: 1.2 mg/dL (ref 0.50–1.35)
GFR, Est African American: 74 mL/min
GFR, Est Non African American: 64 mL/min
GLUCOSE: 91 mg/dL (ref 70–99)
POTASSIUM: 4.5 meq/L (ref 3.5–5.3)
SODIUM: 136 meq/L (ref 135–145)
Total Bilirubin: 0.5 mg/dL (ref 0.2–1.2)
Total Protein: 6.8 g/dL (ref 6.0–8.3)

## 2014-02-04 LAB — CBC WITH DIFFERENTIAL/PLATELET
BASOS ABS: 0 10*3/uL (ref 0.0–0.1)
BASOS PCT: 0 % (ref 0–1)
EOS ABS: 0.1 10*3/uL (ref 0.0–0.7)
Eosinophils Relative: 1 % (ref 0–5)
HEMATOCRIT: 50.5 % (ref 39.0–52.0)
Hemoglobin: 17.7 g/dL — ABNORMAL HIGH (ref 13.0–17.0)
LYMPHS PCT: 24 % (ref 12–46)
Lymphs Abs: 1.3 10*3/uL (ref 0.7–4.0)
MCH: 32.8 pg (ref 26.0–34.0)
MCHC: 35 g/dL (ref 30.0–36.0)
MCV: 93.5 fL (ref 78.0–100.0)
MONOS PCT: 8 % (ref 3–12)
MPV: 10.7 fL (ref 8.6–12.4)
Monocytes Absolute: 0.4 10*3/uL (ref 0.1–1.0)
NEUTROS ABS: 3.8 10*3/uL (ref 1.7–7.7)
NEUTROS PCT: 67 % (ref 43–77)
Platelets: 160 10*3/uL (ref 150–400)
RBC: 5.4 MIL/uL (ref 4.22–5.81)
RDW: 13.8 % (ref 11.5–15.5)
WBC: 5.6 10*3/uL (ref 4.0–10.5)

## 2014-02-04 LAB — RPR

## 2014-02-04 LAB — LIPID PANEL
Cholesterol: 120 mg/dL (ref 0–200)
HDL: 38 mg/dL — ABNORMAL LOW (ref >39)
LDL Cholesterol: 62 mg/dL (ref 0–99)
Total CHOL/HDL Ratio: 3.2 Ratio
Triglycerides: 99 mg/dL (ref <150)
VLDL: 20 mg/dL (ref 0–40)

## 2014-02-05 LAB — T-HELPER CELL (CD4) - (RCID CLINIC ONLY)
CD4 % Helper T Cell: 23 % — ABNORMAL LOW (ref 33–55)
CD4 T CELL ABS: 380 /uL — AB (ref 400–2700)

## 2014-02-05 LAB — HIV-1 RNA QUANT-NO REFLEX-BLD: HIV-1 RNA Quant, Log: <1.3 {Log} (ref <1.30)

## 2014-02-08 ENCOUNTER — Other Ambulatory Visit: Payer: Self-pay | Admitting: Licensed Clinical Social Worker

## 2014-02-08 MED ORDER — TAMSULOSIN HCL 0.4 MG PO CAPS: 0.4000 mg | ORAL_CAPSULE | Freq: Every day | ORAL | Status: DC

## 2014-02-20 ENCOUNTER — Other Ambulatory Visit: Payer: Self-pay | Admitting: Infectious Disease

## 2014-02-20 ENCOUNTER — Encounter: Payer: Self-pay | Admitting: Infectious Disease

## 2014-02-20 ENCOUNTER — Ambulatory Visit (INDEPENDENT_AMBULATORY_CARE_PROVIDER_SITE_OTHER): Payer: BLUE CROSS/BLUE SHIELD | Admitting: Infectious Disease

## 2014-02-20 VITALS — BP 144/89 | HR 66 | Temp 97.6°F | Wt 189.0 lb

## 2014-02-20 DIAGNOSIS — I10 Essential (primary) hypertension: Secondary | ICD-10-CM

## 2014-02-20 DIAGNOSIS — B20 Human immunodeficiency virus [HIV] disease: Secondary | ICD-10-CM

## 2014-02-20 DIAGNOSIS — N529 Male erectile dysfunction, unspecified: Secondary | ICD-10-CM

## 2014-02-20 DIAGNOSIS — N400 Enlarged prostate without lower urinary tract symptoms: Secondary | ICD-10-CM | POA: Diagnosis not present

## 2014-02-20 NOTE — Progress Notes (Signed)
  Subjective:    Patient ID: [name], male    DOB: 03/14/1950, 64 y.o.   MRN: [id number]  HPI  75 -year-old Caucasian male with HIV that has been superbly well controlled on Atripla now STRIBILD returns for routine followup.  He has continued perfect virological control and immune system is doing well.  Lab Results  Component Value Date   HIV1RNAQUANT <20 02/04/2014    Lab Results  Component Value Date   CD4TABS 380* 02/04/2014   CD4TABS 260* 08/20/2013   CD4TABS 300* 01/01/2013    He did not like chantix, he has tried nicotine patches, we gave him information for the quit line.   Review of Systems  Constitutional: Negative for fever, chills, diaphoresis, activity change, appetite change, fatigue and unexpected weight change.  HENT: Positive for postnasal drip. Negative for sinus pressure and trouble swallowing.   Eyes: Negative for photophobia, discharge, itching and visual disturbance.  Respiratory: Negative for chest tightness, shortness of breath and stridor.   Cardiovascular: Negative for palpitations and leg swelling.  Gastrointestinal: Negative for constipation, blood in stool, abdominal distention and anal bleeding.  Genitourinary: Negative for hematuria, flank pain and difficulty urinating.  Musculoskeletal: Negative for myalgias, back pain, joint swelling, arthralgias and gait problem.  Skin: Negative for color change, pallor and wound.  Neurological: Negative for dizziness, tremors, weakness and light-headedness.  Hematological: Negative for adenopathy. Does not bruise/bleed easily.  Psychiatric/Behavioral: Negative for behavioral problems, confusion, sleep disturbance, dysphoric mood, decreased concentration and agitation.       Objective:   Physical Exam  Constitutional: He is oriented to person, place, and time. He appears well-developed and well-nourished. No distress.  HENT:  Head: Normocephalic and atraumatic.  Mouth/Throat: Uvula is midline.  Eyes:  Conjunctivae and EOM are normal. Pupils are equal, round, and reactive to light. No scleral icterus.  Neck: Normal range of motion. Neck supple. No JVD present.  Cardiovascular: Normal rate, regular rhythm and normal heart sounds.  Exam reveals no gallop and no friction rub.   No murmur heard. Pulmonary/Chest: Effort normal and breath sounds normal. No respiratory distress. He has no wheezes. He has no rales. He exhibits no tenderness.  Abdominal: He exhibits no distension and no mass. There is no tenderness. There is no rebound and no guarding.  Musculoskeletal: He exhibits no edema or tenderness.  Lymphadenopathy:    He has no cervical adenopathy.  Neurological: He is alert and oriented to person, place, and time. He has normal reflexes. He exhibits normal muscle tone. Coordination normal.  Skin: Skin is warm and dry. He is not diaphoretic. No erythema. No pallor.  Psychiatric: He has a normal mood and affect. His behavior is normal. Judgment and thought content normal.          Assessment & Plan:   HIV patient wishes to remain on current regimen we'll recheck labs in one year.  Smoking: I spent greater than 3 minutes talking to patient about smoking cessation. Gave him information from quit line  Hypertension: Patient may need other drug added for blood pressure control, besides Flomax.   BPH: Continue Flomax.  Erectile dysfunction still taking Viagra time to time  : Cancer screening we'll refer him to GI for colonoscopy

## 2014-06-15 ENCOUNTER — Other Ambulatory Visit: Payer: Self-pay | Admitting: Infectious Disease

## 2014-06-15 DIAGNOSIS — B20 Human immunodeficiency virus [HIV] disease: Secondary | ICD-10-CM

## 2014-11-05 ENCOUNTER — Encounter: Payer: Self-pay | Admitting: Emergency Medicine

## 2015-01-08 ENCOUNTER — Other Ambulatory Visit: Payer: Self-pay | Admitting: *Deleted

## 2015-01-08 DIAGNOSIS — B20 Human immunodeficiency virus [HIV] disease: Secondary | ICD-10-CM

## 2015-01-08 MED ORDER — ELVITEG-COBIC-EMTRICIT-TENOFDF 150-150-200-300 MG PO TABS: ORAL_TABLET | ORAL | Status: DC

## 2015-03-10 ENCOUNTER — Other Ambulatory Visit: Payer: Self-pay

## 2015-03-19 ENCOUNTER — Other Ambulatory Visit: Payer: Self-pay | Admitting: *Deleted

## 2015-03-19 DIAGNOSIS — B20 Human immunodeficiency virus [HIV] disease: Secondary | ICD-10-CM

## 2015-03-19 MED ORDER — ELVITEG-COBIC-EMTRICIT-TENOFDF 150-150-200-300 MG PO TABS: ORAL_TABLET | ORAL | Status: DC

## 2015-04-07 ENCOUNTER — Other Ambulatory Visit: Payer: 59

## 2015-04-07 DIAGNOSIS — B20 Human immunodeficiency virus [HIV] disease: Secondary | ICD-10-CM

## 2015-04-07 LAB — CBC WITH DIFFERENTIAL/PLATELET
BASOS ABS: 0 10*3/uL (ref 0.0–0.1)
Basophils Relative: 1 % (ref 0–1)
EOS ABS: 0.1 10*3/uL (ref 0.0–0.7)
EOS PCT: 2 % (ref 0–5)
HEMATOCRIT: 49.2 % (ref 39.0–52.0)
Hemoglobin: 17.6 g/dL — ABNORMAL HIGH (ref 13.0–17.0)
LYMPHS ABS: 0.9 10*3/uL (ref 0.7–4.0)
Lymphocytes Relative: 22 % (ref 12–46)
MCH: 33.9 pg (ref 26.0–34.0)
MCHC: 35.8 g/dL (ref 30.0–36.0)
MCV: 94.8 fL (ref 78.0–100.0)
MONO ABS: 0.5 10*3/uL (ref 0.1–1.0)
MPV: 10.5 fL (ref 8.6–12.4)
Monocytes Relative: 11 % (ref 3–12)
Neutro Abs: 2.7 10*3/uL (ref 1.7–7.7)
Neutrophils Relative %: 64 % (ref 43–77)
PLATELETS: 116 10*3/uL — AB (ref 150–400)
RBC: 5.19 MIL/uL (ref 4.22–5.81)
RDW: 13.7 % (ref 11.5–15.5)
WBC: 4.2 10*3/uL (ref 4.0–10.5)

## 2015-04-07 LAB — COMPLETE METABOLIC PANEL WITH GFR
ALT: 50 U/L — AB (ref 9–46)
AST: 42 U/L — ABNORMAL HIGH (ref 10–35)
Albumin: 3.9 g/dL (ref 3.6–5.1)
Alkaline Phosphatase: 63 U/L (ref 40–115)
BUN: 8 mg/dL (ref 7–25)
CALCIUM: 8.6 mg/dL (ref 8.6–10.3)
CHLORIDE: 101 mmol/L (ref 98–110)
CO2: 24 mmol/L (ref 20–31)
CREATININE: 1.06 mg/dL (ref 0.70–1.25)
GFR, EST AFRICAN AMERICAN: 86 mL/min (ref >=60)
GFR, Est Non African American: 74 mL/min (ref >=60)
Glucose, Bld: 95 mg/dL (ref 65–99)
POTASSIUM: 4.4 mmol/L (ref 3.5–5.3)
Sodium: 135 mmol/L (ref 135–146)
Total Bilirubin: 0.5 mg/dL (ref 0.2–1.2)
Total Protein: 6.6 g/dL (ref 6.1–8.1)

## 2015-04-07 LAB — MICROALBUMIN / CREATININE URINE RATIO
Creatinine, Urine: 174 mg/dL (ref 20–370)
Microalb Creat Ratio: 4 mcg/mg creat (ref <30)
Microalb, Ur: 0.7 mg/dL

## 2015-04-07 LAB — LIPID PANEL
CHOL/HDL RATIO: 3.3 ratio (ref <=5.0)
CHOLESTEROL: 101 mg/dL — AB (ref 125–200)
HDL: 31 mg/dL — AB (ref >=40)
LDL Cholesterol: 53 mg/dL (ref <130)
TRIGLYCERIDES: 87 mg/dL (ref <150)
VLDL: 17 mg/dL (ref <30)

## 2015-04-08 LAB — HEPATITIS C ANTIBODY: HCV AB: NEGATIVE

## 2015-04-08 LAB — T-HELPER CELL (CD4) - (RCID CLINIC ONLY)
CD4 % Helper T Cell: 24 % — ABNORMAL LOW (ref 33–55)
CD4 T CELL ABS: 200 /uL — AB (ref 400–2700)

## 2015-04-08 LAB — URINE CYTOLOGY ANCILLARY ONLY
Chlamydia: NEGATIVE
Neisseria Gonorrhea: NEGATIVE

## 2015-04-08 LAB — HIV-1 RNA QUANT-NO REFLEX-BLD
HIV 1 RNA QUANT: 21 {copies}/mL (ref <20)
HIV-1 RNA QUANT, LOG: 1.32 {Log_copies}/mL — AB (ref <1.30)

## 2015-04-08 LAB — RPR

## 2015-04-21 ENCOUNTER — Ambulatory Visit (INDEPENDENT_AMBULATORY_CARE_PROVIDER_SITE_OTHER): Payer: 59 | Admitting: Infectious Disease

## 2015-04-21 ENCOUNTER — Encounter: Payer: Self-pay | Admitting: Infectious Disease

## 2015-04-21 ENCOUNTER — Telehealth: Payer: Self-pay | Admitting: *Deleted

## 2015-04-21 VITALS — BP 147/87 | HR 84 | Temp 97.8°F | Ht 71.0 in | Wt 190.0 lb

## 2015-04-21 DIAGNOSIS — D61818 Other pancytopenia: Secondary | ICD-10-CM | POA: Diagnosis not present

## 2015-04-21 DIAGNOSIS — I10 Essential (primary) hypertension: Secondary | ICD-10-CM

## 2015-04-21 DIAGNOSIS — L282 Other prurigo: Secondary | ICD-10-CM

## 2015-04-21 DIAGNOSIS — B20 Human immunodeficiency virus [HIV] disease: Secondary | ICD-10-CM | POA: Diagnosis not present

## 2015-04-21 DIAGNOSIS — N400 Enlarged prostate without lower urinary tract symptoms: Secondary | ICD-10-CM

## 2015-04-21 HISTORY — DX: Other prurigo: L28.2

## 2015-04-21 MED ORDER — ELVITEG-COBIC-EMTRICIT-TENOFAF 150-150-200-10 MG PO TABS: 1.0000 | ORAL_TABLET | Freq: Every day | ORAL | Status: DC

## 2015-04-21 MED ORDER — TAMSULOSIN HCL 0.4 MG PO CAPS: 0.4000 mg | ORAL_CAPSULE | Freq: Every day | ORAL | Status: DC

## 2015-04-21 NOTE — Telephone Encounter (Signed)
PA needed for [facility], completed and faxed

## 2015-04-21 NOTE — Progress Notes (Signed)
Subjective:   Chief complaint: followup for his HIV on medications    Patient ID: [name], male    DOB: 12/14/1950, 65 y.o.   MRN: [id number]  HPI   65 -year-old Caucasian male with HIV that has been superbly well controlled on Atripla --> STRIBILD returns for routine followup.  He has continued perfect virological control and immune system is doing well.  Lab Results  Component Value Date   HIV1RNAQUANT 21 04/07/2015    Lab Results  Component Value Date   CD4TABS 200* 04/07/2015   CD4TABS 380* 02/04/2014   CD4TABS 260* 08/20/2013    He has run out of flomax but not had return of BPH symptoms.  He has had outbreak of pruritic rash on arms, legs, back, buttocks. He has been sexual active over past year but condoms with all insertive, receptive anal intercourse though not with oral sex, mutual masturbation. His RPR has been negative. NO need soaps or medicines.     Review of Systems  Constitutional: Negative for fever, chills, diaphoresis, activity change, appetite change, fatigue and unexpected weight change.  HENT: Positive for postnasal drip. Negative for sinus pressure and trouble swallowing.   Eyes: Negative for photophobia, discharge, itching and visual disturbance.  Respiratory: Negative for chest tightness, shortness of breath and stridor.   Cardiovascular: Negative for palpitations and leg swelling.  Gastrointestinal: Negative for constipation, blood in stool, abdominal distention and anal bleeding.  Genitourinary: Negative for hematuria, flank pain and difficulty urinating.  Musculoskeletal: Negative for myalgias, back pain, joint swelling, arthralgias and gait problem.  Skin: Negative for color change, pallor and wound.  Neurological: Negative for dizziness, tremors, weakness and light-headedness.  Hematological: Negative for adenopathy. Does not bruise/bleed easily.  Psychiatric/Behavioral: Negative for behavioral problems, confusion, sleep disturbance,  dysphoric mood, decreased concentration and agitation.   Example of rash on legs 04/21/15:        Objective:   Physical Exam  Constitutional: He is oriented to person, place, and time. He appears well-developed and well-nourished. No distress.  HENT:  Head: Normocephalic and atraumatic.  Mouth/Throat: Uvula is midline.  Eyes: Conjunctivae and EOM are normal. Pupils are equal, round, and reactive to light. No scleral icterus.  Neck: Normal range of motion. Neck supple. No JVD present.  Cardiovascular: Normal rate, regular rhythm and normal heart sounds.  Exam reveals no gallop and no friction rub.   No murmur heard. Pulmonary/Chest: Effort normal and breath sounds normal. No respiratory distress. He has no wheezes. He has no rales. He exhibits no tenderness.  Abdominal: He exhibits no distension and no mass. There is no tenderness. There is no rebound and no guarding.  Musculoskeletal: He exhibits no edema or tenderness.  Lymphadenopathy:    He has no cervical adenopathy.  Neurological: He is alert and oriented to person, place, and time. He has normal reflexes. He exhibits normal muscle tone. Coordination normal.  Skin: Skin is warm and dry. He is not diaphoretic. No erythema. No pallor.  Psychiatric: He has a normal mood and affect. His behavior is normal. Judgment and thought content normal.          Assessment & Plan:   HIV: switch to Genvoya for better bone and kidney safety  Pruritic rash: expect it is due to seasonal allergy but will recheck RPR with prozone dilution. Would like him to take daily zyrtec  Smoking: he dislikes chantix, He will continue to make efforts to quit    Hypertension: BP up still  not on flomax  Filed Vitals:   04/21/15 1351  BP: 147/87  Pulse: 84  Temp: 97.8 F (36.6 C)    BPH: Refill Flomax but to hold on taking this until he develops ssx again  Erectile dysfunction still taking Viagra time to time  I spent greater than 25 minutes  with the patient including greater than 50% of time in face to face counsel of the patient re his new ARV regimen, his HTN, smoking, BPH, ED, rash  and in coordination of his care.

## 2015-04-21 NOTE — Telephone Encounter (Signed)
Thanks very much.

## 2015-04-22 LAB — RPR

## 2015-04-23 ENCOUNTER — Encounter: Payer: Self-pay | Admitting: Infectious Disease

## 2015-05-01 NOTE — Telephone Encounter (Signed)
PA for Jorje Guild has been approved.  [place] notified.  [facility] had shipped Stribild yesterday so the pt will need to wait a month to start the Emerald.  [facility] [place] will notify the patient of the change and to take the Stribild for the next month.  The patient's insurance will not pay for the change to be effective this month per [employer].

## 2015-05-01 NOTE — Telephone Encounter (Signed)
Ok very good.

## 2015-06-19 ENCOUNTER — Ambulatory Visit (INDEPENDENT_AMBULATORY_CARE_PROVIDER_SITE_OTHER): Payer: 59 | Admitting: Physician Assistant

## 2015-06-19 VITALS — BP 132/64 | HR 69 | Temp 98.9°F | Resp 16 | Ht 68.5 in | Wt 187.0 lb

## 2015-06-19 DIAGNOSIS — G510 Bell's palsy: Secondary | ICD-10-CM

## 2015-06-19 MED ORDER — VALACYCLOVIR HCL 1 G PO TABS: 1000.0000 mg | ORAL_TABLET | Freq: Two times a day (BID) | ORAL | Status: AC

## 2015-06-19 MED ORDER — ARTIFICIAL TEARS OP OINT: TOPICAL_OINTMENT | Freq: Every evening | OPHTHALMIC | Status: DC | PRN

## 2015-06-19 MED ORDER — PREDNISONE 20 MG PO TABS: ORAL_TABLET | ORAL | Status: DC

## 2015-06-19 NOTE — Patient Instructions (Addendum)
IF you received an x-ray today, you will receive an invoice from [facility]. Please contact [facility] at [phone] with questions or concerns regarding your invoice.   IF you received labwork today, you will receive an invoice from [employer]. Please contact [facility] at [phone] with questions or concerns regarding your invoice.   Our billing staff will not be able to assist you with questions regarding bills from these companies.  You will be contacted with the lab results as soon as they are available. The fastest way to get your results is to activate your My Chart account. Instructions are located on the last page of this paperwork. If you have not heard from [place] regarding the results in 2 weeks, please contact this office.    I would like you to take the medication as prescribed.  The first 5 days you are taking 60mg  of the prednisone, then you go down to 50mg , 40mg , 30mg ... To stopping.  I need you to return sooner than 5 days if your symptoms worsen.  Use the lube as much as possible.    Bell Palsy Bell palsy is a condition in which the muscles on one side of the face become paralyzed. This often causes one side of the face to droop. It is a common condition and most people recover completely. RISK FACTORS Risk factors for Bell palsy include:  Pregnancy.  Diabetes.  An infection by a virus, such as infections that cause cold sores. CAUSES  Bell palsy is caused by damage to or inflammation of a nerve in your face. It is unclear why this happens, but an infection by a virus may lead to it. Most of the time the reason it happens is unknown. SIGNS AND SYMPTOMS  Symptoms can range from mild to severe and can take place over a number of hours. Symptoms may include:  Being unable to:  Raise one or both eyebrows.  Close one or both eyes.  Feel parts of your face (facial numbness).  Drooping of the eyelid and corner of the  mouth.  Weakness in the face.  Paralysis of half your face.  Loss of taste.  Sensitivity to loud noises.  Difficulty chewing.  Tearing up of the affected eye.  Dryness in the affected eye.  Drooling.  Pain behind one ear. DIAGNOSIS  Diagnosis of Bell palsy may include:  A medical history and physical exam.  An MRI.  A CT scan.  Electromyography (EMG). This is a test that checks how your nerves are working. TREATMENT  Treatment may include antiviral medicine to help shorten the length of the condition. Sometimes treatment is not needed and the symptoms go away on their own. HOME CARE INSTRUCTIONS   Take medicines only as directed by your health care provider.  Do facial massages and exercises as directed by your health care provider.  If your eye is affected:  Use moisturizing eye drops to prevent drying of your eye as directed by your health care provider.  Protect your eye as directed by your health care provider. SEEK MEDICAL CARE IF:  Your symptoms do not get better or get worse.  You are drooling.  Your eye is red, irritated, or hurts. SEEK IMMEDIATE MEDICAL CARE IF:   Another part of your body feels weak or numb.  You have difficulty swallowing.  You have a fever along with symptoms of Bell palsy.  You develop neck pain. MAKE SURE YOU:   Understand these instructions.  Will watch your condition.  Will get help right away if you are not doing well or get worse.   This information is not intended to replace advice given to you by your health care provider. Make sure you discuss any questions you have with your health care provider.   Document Released: 01/18/2005 Document Revised: 10/09/2014 Document Reviewed: 04/27/2013 Elsevier Interactive Patient Education [employer].

## 2015-06-19 NOTE — Progress Notes (Signed)
[facility] [address] [place] [address] [phone]  Date:  06/19/2015   Name:  [name]   DOB:  [birth year]   MRN:  [id number]    PCP:  Jenny Reichmann, MD   Chief Complaint  Patient presents with  . Eye Problem    trouble blinking, watery, right, x 3 days    History of Present Illness:  [name] is a 65 y.o. male patient who presents to [facility] for cc of watery eye and trouble breathing.   Patient has had 3 days of watery eyes and trouble blinking.  He has also noticed that he has had difficulty talking.  No numbness or tingling of his face.  He has no vision changes or eye pain.  No rash.  No difficulty with hearing.  He has had no discharge of his eyes.  He is consistently taking his retro-viral medication.  He has no weakness of extremities.  No difficulty walking.  No fever or dizziness.  No chest pains, palpitations.  No fevers.  Patient Active Problem List   Diagnosis Date Noted  . Pruritic rash 04/21/2015  . Erectile dysfunction 03/02/2012  . Allergic conjunctivitis 03/02/2012  . HTN (hypertension) 10/06/2010  . Smoking 10/06/2010  . BPH (benign prostatic hyperplasia) 10/06/2010  . COLONIC POLYPS 10/17/2008  . CYSTS OF ORAL SOFT TISSUES 10/17/2008  . DIVERTICULITIS OF COLON 10/17/2008  . Human immunodeficiency virus (HIV) disease ([place]) 06/06/2008  . THRUSH 06/06/2008  . Pancytopenia ([place]) 06/06/2008  . LUNG NODULE 06/06/2008  . RENAL CALCULUS 06/06/2008    Past Medical History  Diagnosis Date  . HIV infection ([place])   . Erectile dysfunction   . Pruritic rash 04/21/2015  . BPH (benign prostatic hyperplasia)     Past Surgical History  Procedure Laterality Date  . Partial colectomy      Social History  Substance Use Topics  . Smoking status: Current Every Day Smoker -- 0.80 packs/day    Types: Cigarettes  . Smokeless tobacco: Never Used     Comment: 1/2 pack daily  . Alcohol Use: 0.0 oz/week    0 Standard drinks or equivalent  per week     Comment: social    Family History  Problem Relation Age of Onset  . Heart failure Mother   . Stroke Father     No Known Allergies  Medication list has been reviewed and updated.  Current Outpatient Prescriptions on File Prior to Visit  Medication Sig Dispense Refill  . elvitegravir-cobicistat-emtricitabine-tenofovir (GENVOYA) 150-150-200-10 MG TABS tablet Take 1 tablet by mouth daily with breakfast. 30 tablet 11  . sildenafil (VIAGRA) 100 MG tablet Take 1 tablet (100 mg total) by mouth daily as needed for erectile dysfunction. 30 tablet 4  . tamsulosin (FLOMAX) 0.4 MG CAPS capsule Take 1 capsule (0.4 mg total) by mouth daily. 30 capsule 11   No current facility-administered medications on file prior to visit.    ROS ROS otherwise unremarkable unless listed above.   Physical Examination: BP 132/64 mmHg  Pulse 69  Temp(Src) 98.9 F (37.2 C)  Resp 16  Ht 5' 8.5" (1.74 m)  Wt 187 lb (84.823 kg)  BMI 28.02 kg/m2  SpO2 97% Ideal Body Weight: Weight in (lb) to have BMI = 25: 166.5  Physical Exam  Constitutional: He is oriented to person, place, and time. He appears well-developed and well-nourished. No distress.  HENT:  Head: Normocephalic and atraumatic.  Eyes: EOM are normal. Pupils  are equal, round, and reactive to light. Right eye exhibits no discharge. Right conjunctiva is injected. Right eye exhibits normal extraocular motion. Left eye exhibits normal extraocular motion.  Cardiovascular: Normal rate and regular rhythm.  Exam reveals no gallop and no friction rub.   No murmur heard. Pulmonary/Chest: Effort normal. No respiratory distress. He has no decreased breath sounds. He has no wheezes.  Musculoskeletal: Normal range of motion.  Normal musculoskeletal strength throughout, and rapid movement throughout extremities.   Neurological: He is alert and oriented to person, place, and time. A cranial nerve deficit is present.  eyebrow sagging, inability to  close the eye, disappearance of the nasolabial fold, and drooping right corner of eye.     Skin: Skin is warm and dry. He is not diaphoretic.  Psychiatric: He has a normal mood and affect. His behavior is normal.     Assessment and Plan: KARLA SUMMERLIN is a 65 y.o. male who is here today for eye redness and watery eyes. --prednisone 60mg  10 day taper.  Instructed 60mg  for 5 days, then 10mg  decrease taper over 5 days.  Also will do the valtrex 1g bid for 7 days.  Advised to return in 5 days for follow up.  Also prescribed lacrilube ointment. Bell's palsy - Plan: artificial tears (LACRILUBE) OINT ophthalmic ointment  Ivar Drape, [name] [facility] 06/19/2015 3:13 PM

## 2015-06-23 ENCOUNTER — Ambulatory Visit (INDEPENDENT_AMBULATORY_CARE_PROVIDER_SITE_OTHER): Payer: 59 | Admitting: Physician Assistant

## 2015-06-23 VITALS — BP 122/80 | HR 88 | Temp 98.9°F | Resp 18 | Ht 68.5 in | Wt 189.6 lb

## 2015-06-23 DIAGNOSIS — G510 Bell's palsy: Secondary | ICD-10-CM

## 2015-06-23 NOTE — Progress Notes (Signed)
[facility] [address], [place] [address] [phone]  Date:  06/23/2015   Name:  [name]   DOB:  21-Mar-1950   MRN:  [id number]  PCP:  Jenny Reichmann, MD   Chief Complaint  Patient presents with  . Follow-up    BELL'S PALSY    History of Present Illness:  [name] is a 65 y.o. male patient who presents to [facility] for follow up of Bell's Palsy.  Patient was seen here 5 days ago for watery eyes, and inability to use mouth properly.  Patient placed on prednisone 10 day with 5 day taper, valtrex, and lacrilube.   Patient has noticed little improvement of his symptoms.  His eye is clearer.  He does not have any numbness or tingling.  He is compliantly taking medication.       Patient Active Problem List   Diagnosis Date Noted  . Pruritic rash 04/21/2015  . Erectile dysfunction 03/02/2012  . Allergic conjunctivitis 03/02/2012  . HTN (hypertension) 10/06/2010  . Smoking 10/06/2010  . BPH (benign prostatic hyperplasia) 10/06/2010  . COLONIC POLYPS 10/17/2008  . CYSTS OF ORAL SOFT TISSUES 10/17/2008  . DIVERTICULITIS OF COLON 10/17/2008  . Human immunodeficiency virus (HIV) disease ([place]) 06/06/2008  . THRUSH 06/06/2008  . Pancytopenia ([place]) 06/06/2008  . LUNG NODULE 06/06/2008  . RENAL CALCULUS 06/06/2008    Past Medical History  Diagnosis Date  . HIV infection ([place])   . Erectile dysfunction   . Pruritic rash 04/21/2015  . BPH (benign prostatic hyperplasia)     Past Surgical History  Procedure Laterality Date  . Partial colectomy      Social History  Substance Use Topics  . Smoking status: Current Every Day Smoker -- 0.80 packs/day    Types: Cigarettes  . Smokeless tobacco: Never Used     Comment: 1/2 pack daily  . Alcohol Use: 0.0 oz/week    0 Standard drinks or equivalent per week     Comment: social    Family History  Problem Relation Age of Onset  . Heart failure Mother   . Stroke Father     No Known Allergies  Medication  list has been reviewed and updated.  Current Outpatient Prescriptions on File Prior to Visit  Medication Sig Dispense Refill  . artificial tears (LACRILUBE) OINT ophthalmic ointment Place into both eyes at bedtime as needed for dry eyes. 3.5 g 0  . elvitegravir-cobicistat-emtricitabine-tenofovir (GENVOYA) 150-150-200-10 MG TABS tablet Take 1 tablet by mouth daily with breakfast. 30 tablet 11  . predniSONE (DELTASONE) 20 MG tablet Take 3 PO QAM x5days, 2.5 PO QAM x1days, 2 PO QAM x1days, 1.5 PO QAM x 1day, 1 PO QAM x 1day, .5 PO QAM x 1day 23 tablet 0  . sildenafil (VIAGRA) 100 MG tablet Take 1 tablet (100 mg total) by mouth daily as needed for erectile dysfunction. 30 tablet 4  . tamsulosin (FLOMAX) 0.4 MG CAPS capsule Take 1 capsule (0.4 mg total) by mouth daily. 30 capsule 11  . valACYclovir (VALTREX) 1000 MG tablet Take 1 tablet (1,000 mg total) by mouth 2 (two) times daily. 14 tablet 0   No current facility-administered medications on file prior to visit.    ROS   Physical Examination: BP 122/80 mmHg  Pulse 88  Temp(Src) 98.9 F (37.2 C) (Oral)  Resp 18  Ht 5' 8.5" (1.74 m)  Wt 189 lb 9.6 oz (86.002 kg)  BMI 28.41 kg/m2  SpO2 99%  Ideal Body Weight: Weight in (lb) to have BMI = 25: 166.5  Physical Exam Constitutional: He is oriented to person, place, and time. He appears well-developed and well-nourished. No distress.  HENT:  Head: Normocephalic and atraumatic.  Eyes: EOM are normal. Pupils are equal, round, and reactive to light. Right eye exhibits no discharge. Right conjunctiva is normal at this time. Right eye exhibits normal extraocular motion. Left eye exhibits normal extraocular motion.  Cardiovascular: Normal rate and regular rhythm. Exam reveals no gallop and no friction rub.  No murmur heard. Pulmonary/Chest: Effort normal. No respiratory distress. He has no decreased breath sounds. He has no wheezes.  Musculoskeletal: Normal range of motion.  Normal  musculoskeletal strength throughout, and rapid movement throughout extremities.  Neurological: He is alert and oriented to person, place, and time. A cranial nerve deficit is present.  Right sided eyebrow sagging, though this has slightly improved, inability to close the eye, disappearance of the nasolabial fold, and drooping right corner of eye.  Patient is able to close eye through forceful movement, which is an improvement from 5 days ago.   Skin: Skin is warm and dry. He is not diaphoretic.  Psychiatric: He has a normal mood and affect. His behavior is normal.   Visual Acuity Screening   Right eye Left eye Both eyes  Without correction: 20/30 -1  20/30 -1  With correction:  20/50 -1     Assessment and Plan: [name] is a 65 y.o. male who is here today for follow up of Bell's Palsy. There is some improvement of his symptoms, though minimal.  I have advised him to continue the medication.  He will rtc in 5 days for follow up again.   Bell's palsy  Ivar Drape, [name] [facility] 06/23/2015 8:34 AM

## 2015-06-23 NOTE — Patient Instructions (Addendum)
     IF you received an x-ray today, you will receive an invoice from [facility]. Please contact [facility] at [phone] with questions or concerns regarding your invoice.   IF you received labwork today, you will receive an invoice from [employer]. Please contact [facility] at [phone] with questions or concerns regarding your invoice.   Our billing staff will not be able to assist you with questions regarding bills from these companies.  You will be contacted with the lab results as soon as they are available. The fastest way to get your results is to activate your My Chart account. Instructions are located on the last page of this paperwork. If you have not heard from [place] regarding the results in 2 weeks, please contact this office.    Please continue the medications, and lubricants.   I would like to see you in 5 days.

## 2015-06-28 ENCOUNTER — Ambulatory Visit (INDEPENDENT_AMBULATORY_CARE_PROVIDER_SITE_OTHER): Payer: 59 | Admitting: Emergency Medicine

## 2015-06-28 VITALS — BP 120/80 | HR 70 | Temp 98.3°F | Resp 18 | Ht 68.5 in | Wt 187.5 lb

## 2015-06-28 DIAGNOSIS — G510 Bell's palsy: Secondary | ICD-10-CM

## 2015-06-28 NOTE — Patient Instructions (Signed)
     IF you received an x-ray today, you will receive an invoice from [facility]. Please contact [facility] at [phone] with questions or concerns regarding your invoice.   IF you received labwork today, you will receive an invoice from [facility]. Please contact [facility] at [phone] with questions or concerns regarding your invoice.   Our billing staff will not be able to assist you with questions regarding bills from these companies.  You will be contacted with the lab results as soon as they are available. The fastest way to get your results is to activate your My Chart account. Instructions are located on the last page of this paperwork. If you have not heard from us regarding the results in 2 weeks, please contact this office.      

## 2015-06-28 NOTE — Progress Notes (Signed)
By signing my name below, I, [name], attest that this documentation has been prepared under the direction and in the presence of Aaron Jordan, MD. Electronically Signed: Judithe Blackwell, ER Scribe. 06/28/2015. 10:03 AM.  Chief Complaint:  Chief Complaint  Patient presents with  . Follow-up    Bells Palsy follow-up   HPI: [name] is a 65 y.o. male with a past hx of HIV who reports to [facility] today reporting for a final follow up visit due to a bells palsy initial diagnosis on 06/23/2015. He was seen by Colletta Maryland at that visit. He states he thinks his face is slightly more responsive but is still unable to close his right eye. He finished his steroid yesterday.    Past Medical History  Diagnosis Date  . HIV infection ([place])   . Erectile dysfunction   . Pruritic rash 04/21/2015  . BPH (benign prostatic hyperplasia)    Past Surgical History  Procedure Laterality Date  . Partial colectomy     Social History   Social History  . Marital Status: Single    Spouse Name: N/A  . Number of Children: N/A  . Years of Education: N/A   Social History Main Topics  . Smoking status: Current Every Day Smoker -- 0.80 packs/day    Types: Cigarettes  . Smokeless tobacco: Never Used     Comment: 1/2 pack daily  . Alcohol Use: 0.0 oz/week    0 Standard drinks or equivalent per week     Comment: social  . Drug Use: No  . Sexual Activity: Yes     Comment: given condoms   Other Topics Concern  . None   Social History Narrative   Family History  Problem Relation Age of Onset  . Heart failure Mother   . Stroke Father    No Known Allergies Prior to Admission medications   Medication Sig Start Date End Date Taking? Authorizing Provider  artificial tears (LACRILUBE) OINT ophthalmic ointment Place into both eyes at bedtime as needed for dry eyes. 06/19/15  Yes Dorian Heckle English, PA  elvitegravir-cobicistat-emtricitabine-tenofovir (GENVOYA) 150-150-200-10 MG TABS tablet Take 1  tablet by mouth daily with breakfast. 04/21/15  Yes Truman Hayward, MD  sildenafil (VIAGRA) 100 MG tablet Take 1 tablet (100 mg total) by mouth daily as needed for erectile dysfunction. 07/17/13  Yes Truman Hayward, MD  tamsulosin (FLOMAX) 0.4 MG CAPS capsule Take 1 capsule (0.4 mg total) by mouth daily. 04/21/15  Yes Truman Hayward, MD  predniSONE (DELTASONE) 20 MG tablet Take 3 PO QAM x5days, 2.5 PO QAM x1days, 2 PO QAM x1days, 1.5 PO QAM x 1day, 1 PO QAM x 1day, .5 PO QAM x 1day Patient not taking: Reported on 06/28/2015 06/19/15   Dorian Heckle English, PA     ROS: The patient denies fevers, chills, night sweats, unintentional weight loss, chest pain, palpitations, wheezing, dyspnea on exertion, nausea, vomiting, abdominal pain, dysuria, hematuria, melena, numbness, or tingling.   All other systems have been reviewed and were otherwise negative with the exception of those mentioned in the HPI and as above.    PHYSICAL EXAM: Filed Vitals:   06/28/15 0917  BP: 120/80  Pulse: 70  Temp: 98.3 F (36.8 C)  Resp: 18   Body mass index is 28.09 kg/(m^2).   General: Alert, no acute distress HEENT:  Normocephalic, atraumatic, oropharynx patent. Eye: [facility]te Mangle [facility] Cardiovascular:  Regular rate and rhythm, no rubs murmurs or gallops.  No Carotid bruits, radial pulse intact. No pedal edema.  Respiratory: Clear to auscultation bilaterally.  No wheezes, rales, or rhonchi.  No cyanosis, no use of accessory musculature Abdominal: No organomegaly, abdomen is soft and non-tender, positive bowel sounds.  No masses. Musculoskeletal: Gait intact. No edema, tenderness Skin: No rashes. Neurologic: Normo cephalic, atraumatic. Right facial palsy he is unable to completely close the right lid there are no other focal neurological signs.  Psychiatric: Patient acts appropriately throughout our interaction. Lymphatic: No cervical or submandibular lymphadenopathy    LABS:    EKG/XRAY:     Primary read interpreted by Dr. Everlene Farrier at [facility].   ASSESSMENT/PLAN: Patient will recheck in 4-6 weeks. He has completed his course of prednisone and Valtrex. He was cautioned regarding exposure keratitis and will keep his eye taped at night.I personally performed the services described in this documentation, which was scribed in my presence. The recorded information has been reviewed and is accurate.   Gross sideeffects, risk and benefits, and alternatives of medications d/w patient. Patient is aware that all medications have potential sideeffects and we are unable to predict every sideeffect or drug-drug interaction that may occur.  Arlyss Queen MD 06/28/2015 10:03 AM

## 2015-11-03 ENCOUNTER — Ambulatory Visit (INDEPENDENT_AMBULATORY_CARE_PROVIDER_SITE_OTHER): Payer: 59 | Admitting: Physician Assistant

## 2015-11-03 VITALS — BP 120/78 | HR 85 | Temp 98.1°F | Resp 16 | Ht 68.5 in | Wt 187.4 lb

## 2015-11-03 DIAGNOSIS — H1032 Unspecified acute conjunctivitis, left eye: Secondary | ICD-10-CM

## 2015-11-03 DIAGNOSIS — Z23 Encounter for immunization: Secondary | ICD-10-CM

## 2015-11-03 MED ORDER — POLYMYXIN B-TRIMETHOPRIM 10000-0.1 UNIT/ML-% OP SOLN: 2.0000 [drp] | OPHTHALMIC | 0 refills | Status: DC

## 2015-11-03 NOTE — Patient Instructions (Addendum)
   IF you received an x-ray today, you will receive an invoice from [facility]. Please contact [facility] at [phone] with questions or concerns regarding your invoice.   IF you received labwork today, you will receive an invoice from [facility]. Please contact [facility] at [phone] with questions or concerns regarding your invoice.   Our billing staff will not be able to assist you with questions regarding bills from these companies.  You will be contacted with the lab results as soon as they are available. The fastest way to get your results is to activate your My Chart account. Instructions are located on the last page of this paperwork. If you have not heard from us regarding the results in 2 weeks, please contact this office.    Influenza (Flu) Vaccine (Inactivated or Recombinant):  1. Why get vaccinated? Influenza ("flu") is a contagious disease that spreads around the [place] every year, usually between October and May. Flu is caused by influenza viruses, and is spread mainly by coughing, sneezing, and close contact. Anyone can get flu. Flu strikes suddenly and can last several days. Symptoms vary by age, but can include:  fever/chills  sore throat  muscle aches  fatigue  cough  headache  runny or stuffy nose Flu can also lead to pneumonia and blood infections, and cause diarrhea and seizures in children. If you have a medical condition, such as heart or lung disease, flu can make it worse. Flu is more dangerous for some people. Infants and young children, people 65 years of age and older, pregnant women, and people with certain health conditions or a weakened immune system are at greatest risk. Each year thousands of people in the [place] die from flu, and many more are hospitalized. Flu vaccine can:  keep you from getting flu,  make flu less severe if you do get it, and  keep you from spreading flu to  your family and other people. 2. Inactivated and recombinant flu vaccines A dose of flu vaccine is recommended every flu season. Children 6 months through 8 years of age may need two doses during the same flu season. Everyone else needs only one dose each flu season. Some inactivated flu vaccines contain a very small amount of a mercury-based preservative called thimerosal. Studies have not shown thimerosal in vaccines to be harmful, but flu vaccines that do not contain thimerosal are available. There is no live flu virus in flu shots. They cannot cause the flu. There are many flu viruses, and they are always changing. Each year a new flu vaccine is made to protect against three or four viruses that are likely to cause disease in the upcoming flu season. But even when the vaccine doesn't exactly match these viruses, it may still provide some protection. Flu vaccine cannot prevent:  flu that is caused by a virus not covered by the vaccine, or  illnesses that look like flu but are not. It takes about 2 weeks for protection to develop after vaccination, and protection lasts through the flu season. 3. Some people should not get this vaccine Tell the person who is giving you the vaccine:  If you have any severe, life-threatening allergies. If you ever had a life-threatening allergic reaction after a dose of flu vaccine, or have a severe allergy to any part of this vaccine, you may be advised not to get vaccinated. Most, but not all, types of flu vaccine contain a small amount of egg protein.    If you ever had Guillain-Barre Syndrome (also called GBS). Some people with a history of GBS should not get this vaccine. This should be discussed with your doctor.  If you are not feeling well. It is usually okay to get flu vaccine when you have a mild illness, but you might be asked to come back when you feel better. 4. Risks of a vaccine reaction With any medicine, including vaccines, there is a chance of  reactions. These are usually mild and go away on their own, but serious reactions are also possible. Most people who get a flu shot do not have any problems with it. Minor problems following a flu shot include:  soreness, redness, or swelling where the shot was given  hoarseness  sore, red or itchy eyes  cough  fever  aches  headache  itching  fatigue If these problems occur, they usually begin soon after the shot and last 1 or 2 days. More serious problems following a flu shot can include the following:  There may be a small increased risk of Guillain-Barre Syndrome (GBS) after inactivated flu vaccine. This risk has been estimated at 1 or 2 additional cases per million people vaccinated. This is much lower than the risk of severe complications from flu, which can be prevented by flu vaccine.  Young children who get the flu shot along with pneumococcal vaccine (PCV13) and/or DTaP vaccine at the same time might be slightly more likely to have a seizure caused by fever. Ask your doctor for more information. Tell your doctor if a child who is getting flu vaccine has ever had a seizure. Problems that could happen after any injected vaccine:  People sometimes faint after a medical procedure, including vaccination. Sitting or lying down for about 15 minutes can help prevent fainting, and injuries caused by a fall. Tell your doctor if you feel dizzy, or have vision changes or ringing in the ears.  Some people get severe pain in the shoulder and have difficulty moving the arm where a shot was given. This happens very rarely.  Any medication can cause a severe allergic reaction. Such reactions from a vaccine are very rare, estimated at about 1 in a million doses, and would happen within a few minutes to a few hours after the vaccination. As with any medicine, there is a very remote chance of a vaccine causing a serious injury or death. The safety of vaccines is always being monitored. For  more information, visit: www.cdc.gov/vaccinesafety/ 5. What if there is a serious reaction? What should I look for?  Look for anything that concerns you, such as signs of a severe allergic reaction, very high fever, or unusual behavior. Signs of a severe allergic reaction can include hives, swelling of the face and throat, difficulty breathing, a fast heartbeat, dizziness, and weakness. These would start a few minutes to a few hours after the vaccination. What should I do?  If you think it is a severe allergic reaction or other emergency that can't wait, call 9-1-1 and get the person to the nearest hospital. Otherwise, call your doctor.  Reactions should be reported to the Vaccine Adverse Event Reporting System (VAERS). Your doctor should file this report, or you can do it yourself through the VAERS web site at www.vaers.hhs.gov, or by calling 1-800-822-7967. VAERS does not give medical advice. 6. The National Vaccine Injury Compensation Program The National Vaccine Injury Compensation Program (VICP) is a federal program that was created to compensate people who may have been   injured by certain vaccines. Persons who believe they may have been injured by a vaccine can learn about the program and about filing a claim by calling 1-800-338-2382 or visiting the VICP website at www.hrsa.gov/vaccinecompensation. There is a time limit to file a claim for compensation. 7. How can I learn more?  Ask your healthcare provider. He or she can give you the vaccine package insert or suggest other sources of information.  Call your local or state health department.  Contact the Centers for Disease Control and Prevention (CDC):  Call 1-800-232-4636 (1-800-CDC-INFO) or  Visit CDC's website at www.cdc.gov/flu Vaccine Information Statement Inactivated Influenza Vaccine (09/07/2013)   This information is not intended to replace advice given to you by your health care provider. Make sure you discuss any  questions you have with your health care provider.   Document Released: 11/12/2005 Document Revised: 02/08/2014 Document Reviewed: 09/10/2013 Elsevier Interactive Patient Education 2016 Elsevier Inc.  

## 2015-11-03 NOTE — Progress Notes (Signed)
By signing my name below, I, Raven Small, attest that this documentation has been prepared under the direction and in the presence of Philis Fendt, [name].  Electronically Signed: Thea Alken, ED Scribe. 11/03/2015. 6:06 PM.  11/03/2015 6:06 PM   DOB: 1950/07/04 / MRN: [id number]  SUBJECTIVE:  [name] is a 65 y.o. male with hx of well controlled HIV presenting for left eye blurriness onset 3 days. Pt reports waking up in the morning with drainage in left eye. Pt wears contacts only in left eye; he has not removed contact since onset of symptoms. He's tried Visine and OTC pink eye relief without relief to symptoms. Pt visit optometrist once a year and plans to make an appointment tomorrow. He denies possible foreign body. Pt takes genvoya and is compliant with medication. He denies photophobia, HA, facial pain, nausea and eye pain. He wears contacts and has not taken his contact out since the onset of this problem.    He has No Known Allergies.   He  has a past medical history of BPH (benign prostatic hyperplasia); Erectile dysfunction; HIV infection ([place]); and Pruritic rash (04/21/2015).    He  reports that he has been smoking Cigarettes.  He has been smoking about 0.80 packs per day. He has never used smokeless tobacco. He reports that he drinks alcohol. He reports that he does not use drugs. He  reports that he currently engages in sexual activity. The patient  has a past surgical history that includes Partial colectomy.  His family history includes Heart failure in his mother; Stroke in his father.  Review of Systems  Eyes: Positive for blurred vision. Negative for photophobia and pain.  Gastrointestinal: Negative for nausea.  Neurological: Negative for headaches.    The problem list and medications were reviewed and updated by myself where necessary and exist elsewhere in the encounter.   OBJECTIVE:  BP 120/78 (BP Location: Right Arm, Patient Position: Sitting, Cuff Size: Normal)    Pulse 85   Temp 98.1 F (36.7 C) (Oral)   Resp 16   Ht 5' 8.5" (1.74 m)   Wt 187 lb 6.4 oz (85 kg)   SpO2 97%   BMI 28.08 kg/m   Physical Exam  Constitutional: Vital signs are normal.  HENT:  Right Ear: Hearing, tympanic membrane, external ear and ear canal normal.  Left Ear: Hearing, tympanic membrane, external ear and ear canal normal.  Mouth/Throat: Oropharynx is clear and moist.  Eyes: EOM are normal. Pupils are equal, round, and reactive to light.  Lymphadenopathy:       Head (right side): No preauricular adenopathy present.       Head (left side): No preauricular adenopathy present.    Visual Acuity Screening   Right eye Left eye Both eyes  Without correction: 20/20 20/70 20/20   With correction:      ASSESSMENT AND PLAN  Need for prophylactic vaccination and inoculation against influenza - Plan: Flu Vaccine QUAD 36+ mos IM  Acute conjunctivitis of left eye, unspecified acute conjunctivitis type: He has a change in vision and is without any other red flags.  I have advised that he take the left contact out and start eye drops tonight.  I advised that we schedule him first thing in the morning with Dr. Katy Fitch however patient wants to go to his eye doctor tomorrow.  Advised that if he has any problems getting in tomorrow to call here and we will stat him to Dr. Zenia Resides office.  The patient is advised to call or return to clinic if he does not see an improvement in symptoms, or to seek the care of the closest emergency department if he worsens with the above plan.   Philis Fendt, MHS, [name] [facility] 11/03/2015 6:06 PM

## 2016-03-22 ENCOUNTER — Other Ambulatory Visit: Payer: Self-pay | Admitting: Infectious Disease

## 2016-03-22 DIAGNOSIS — B20 Human immunodeficiency virus [HIV] disease: Secondary | ICD-10-CM

## 2016-04-13 ENCOUNTER — Other Ambulatory Visit: Payer: Self-pay | Admitting: Infectious Disease

## 2016-04-13 DIAGNOSIS — Z79899 Other long term (current) drug therapy: Secondary | ICD-10-CM

## 2016-04-13 DIAGNOSIS — Z113 Encounter for screening for infections with a predominantly sexual mode of transmission: Secondary | ICD-10-CM

## 2016-04-13 DIAGNOSIS — B20 Human immunodeficiency virus [HIV] disease: Secondary | ICD-10-CM

## 2016-04-15 ENCOUNTER — Other Ambulatory Visit: Payer: Self-pay

## 2016-04-22 ENCOUNTER — Telehealth: Payer: Self-pay | Admitting: *Deleted

## 2016-04-22 NOTE — Telephone Encounter (Signed)
Thanks Denise! 

## 2016-04-22 NOTE — Telephone Encounter (Addendum)
PA received, completed and faxed back to [place], Jorje Guild.  Waiting on response.  PA approved through April 29 2017.

## 2016-04-29 ENCOUNTER — Other Ambulatory Visit: Payer: Self-pay | Admitting: Infectious Disease

## 2016-04-29 DIAGNOSIS — B20 Human immunodeficiency virus [HIV] disease: Secondary | ICD-10-CM

## 2016-05-10 ENCOUNTER — Other Ambulatory Visit (HOSPITAL_COMMUNITY)
Admission: RE | Admit: 2016-05-10 | Discharge: 2016-05-10 | Disposition: A | Discharge status: Home / Self Care | Payer: 59 | Source: Ambulatory Visit | Attending: Infectious Disease | Admitting: Infectious Disease

## 2016-05-10 ENCOUNTER — Other Ambulatory Visit: Payer: 59

## 2016-05-10 DIAGNOSIS — Z113 Encounter for screening for infections with a predominantly sexual mode of transmission: Secondary | ICD-10-CM

## 2016-05-10 DIAGNOSIS — B20 Human immunodeficiency virus [HIV] disease: Secondary | ICD-10-CM

## 2016-05-10 DIAGNOSIS — Z79899 Other long term (current) drug therapy: Secondary | ICD-10-CM

## 2016-05-10 LAB — CBC WITH DIFFERENTIAL/PLATELET
Basophils Absolute: 0 cells/uL (ref 0–200)
Basophils Relative: 0 %
EOS ABS: 108 {cells}/uL (ref 15–500)
Eosinophils Relative: 2 %
HEMATOCRIT: 50.6 % — AB (ref 38.5–50.0)
Hemoglobin: 17.4 g/dL — ABNORMAL HIGH (ref 13.2–17.1)
LYMPHS ABS: 1350 {cells}/uL (ref 850–3900)
Lymphocytes Relative: 25 %
MCH: 33 pg (ref 27.0–33.0)
MCHC: 34.4 g/dL (ref 32.0–36.0)
MCV: 95.8 fL (ref 80.0–100.0)
MONO ABS: 540 {cells}/uL (ref 200–950)
MPV: 10.6 fL (ref 7.5–12.5)
Monocytes Relative: 10 %
NEUTROS PCT: 63 %
Neutro Abs: 3402 cells/uL (ref 1500–7800)
Platelets: 181 10*3/uL (ref 140–400)
RBC: 5.28 MIL/uL (ref 4.20–5.80)
RDW: 13.7 % (ref 11.0–15.0)
WBC: 5.4 10*3/uL (ref 3.8–10.8)

## 2016-05-10 LAB — COMPREHENSIVE METABOLIC PANEL
ALT: 36 U/L (ref 9–46)
AST: 25 U/L (ref 10–35)
Albumin: 4 g/dL (ref 3.6–5.1)
Alkaline Phosphatase: 54 U/L (ref 40–115)
BUN: 13 mg/dL (ref 7–25)
CO2: 27 mmol/L (ref 20–31)
CREATININE: 1.3 mg/dL — AB (ref 0.70–1.25)
Calcium: 9 mg/dL (ref 8.6–10.3)
Chloride: 105 mmol/L (ref 98–110)
GLUCOSE: 113 mg/dL — AB (ref 65–99)
Potassium: 4.5 mmol/L (ref 3.5–5.3)
SODIUM: 136 mmol/L (ref 135–146)
Total Bilirubin: 0.4 mg/dL (ref 0.2–1.2)
Total Protein: 6.6 g/dL (ref 6.1–8.1)

## 2016-05-10 LAB — LIPID PANEL
CHOL/HDL RATIO: 3.3 ratio (ref <5.0)
Cholesterol: 126 mg/dL (ref <200)
HDL: 38 mg/dL — AB (ref >40)
LDL CALC: 62 mg/dL (ref <100)
TRIGLYCERIDES: 129 mg/dL (ref <150)
VLDL: 26 mg/dL (ref <30)

## 2016-05-11 LAB — T-HELPER CELL (CD4) - (RCID CLINIC ONLY)
CD4 % Helper T Cell: 24 % — ABNORMAL LOW (ref 33–55)
CD4 T CELL ABS: 370 /uL — AB (ref 400–2700)

## 2016-05-11 LAB — URINE CYTOLOGY ANCILLARY ONLY
Chlamydia: NEGATIVE
NEISSERIA GONORRHEA: NEGATIVE

## 2016-05-11 LAB — RPR

## 2016-05-12 LAB — HIV-1 RNA QUANT-NO REFLEX-BLD
HIV 1 RNA QUANT: 43 {copies}/mL — AB
HIV-1 RNA Quant, Log: 1.63 Log copies/mL — ABNORMAL HIGH

## 2016-05-14 ENCOUNTER — Other Ambulatory Visit: Payer: Self-pay | Admitting: Infectious Disease

## 2016-05-26 ENCOUNTER — Ambulatory Visit (INDEPENDENT_AMBULATORY_CARE_PROVIDER_SITE_OTHER): Payer: 59 | Admitting: Infectious Disease

## 2016-05-26 ENCOUNTER — Encounter: Payer: Self-pay | Admitting: Infectious Disease

## 2016-05-26 VITALS — BP 154/84 | HR 89 | Temp 98.6°F | Wt 198.0 lb

## 2016-05-26 DIAGNOSIS — N401 Enlarged prostate with lower urinary tract symptoms: Secondary | ICD-10-CM

## 2016-05-26 DIAGNOSIS — B20 Human immunodeficiency virus [HIV] disease: Secondary | ICD-10-CM

## 2016-05-26 DIAGNOSIS — R351 Nocturia: Secondary | ICD-10-CM | POA: Diagnosis not present

## 2016-05-26 DIAGNOSIS — D61818 Other pancytopenia: Secondary | ICD-10-CM

## 2016-05-26 DIAGNOSIS — D751 Secondary polycythemia: Secondary | ICD-10-CM

## 2016-05-26 DIAGNOSIS — F17200 Nicotine dependence, unspecified, uncomplicated: Secondary | ICD-10-CM | POA: Diagnosis not present

## 2016-05-26 DIAGNOSIS — IMO0001 Reserved for inherently not codable concepts without codable children: Secondary | ICD-10-CM

## 2016-05-26 DIAGNOSIS — I10 Essential (primary) hypertension: Secondary | ICD-10-CM | POA: Diagnosis not present

## 2016-05-26 HISTORY — DX: Secondary polycythemia: D75.1

## 2016-05-26 MED ORDER — TAMSULOSIN HCL 0.4 MG PO CAPS: 0.4000 mg | ORAL_CAPSULE | Freq: Every day | ORAL | 11 refills | Status: DC

## 2016-05-26 MED ORDER — ELVITEG-COBIC-EMTRICIT-TENOFAF 150-150-200-10 MG PO TABS: ORAL_TABLET | ORAL | 11 refills | Status: DC

## 2016-05-26 NOTE — Progress Notes (Signed)
Subjective:   Chief complaint: followup for his HIV on medications    Patient ID: [name], male    DOB: 06-01-1950, 66 y.o.   MRN: [id number]  HPI  40 -year-old Caucasian male with HIV that has been superbly well controlled on Atripla --> STRIBILD-->  GENVOYA returns for routine followup.  He has continued near perfect virological control and immune system is doing well.  Lab Results  Component Value Date   HIV1RNAQUANT 43 (H) 05/10/2016   HIV1RNAQUANT 21 04/07/2015   HIV1RNAQUANT <20 02/04/2014     Lab Results  Component Value Date   CD4TABS 370 (L) 05/10/2016   CD4TABS 200 (L) 04/07/2015   CD4TABS 380 (L) 02/04/2014    He has run out of flomax and asks for refill. He would like to continue yearly visits for HIV and I am OK with this.    Past Medical History:  Diagnosis Date  . BPH (benign prostatic hyperplasia)   . Erectile dysfunction   . HIV infection ([place])   . Pruritic rash 04/21/2015    Past Surgical History:  Procedure Laterality Date  . PARTIAL COLECTOMY      Family History  Problem Relation Age of Onset  . Heart failure Mother   . Stroke Father       Social History   Social History  . Marital status: Single    Spouse name: N/A  . Number of children: N/A  . Years of education: N/A   Social History Main Topics  . Smoking status: Current Every Day Smoker    Packs/day: 0.80    Types: Cigarettes  . Smokeless tobacco: Never Used     Comment: 1/2 pack daily  . Alcohol use 0.0 oz/week     Comment: social  . Drug use: No  . Sexual activity: Yes     Comment: given condoms   Other Topics Concern  . None   Social History Narrative  . None    No Known Allergies   Current Outpatient Prescriptions:  .  artificial tears (LACRILUBE) OINT ophthalmic ointment, Place into both eyes at bedtime as needed for dry eyes., Disp: 3.5 g, Rfl: 0 .  elvitegravir-cobicistat-emtricitabine-tenofovir (GENVOYA) 150-150-200-10 MG TABS tablet, Take 1  tablet by mouth once per day with breakfast, Disp: 30 tablet, Rfl: 11 .  sildenafil (VIAGRA) 100 MG tablet, Take 1 tablet (100 mg total) by mouth daily as needed for erectile dysfunction., Disp: 30 tablet, Rfl: 4 .  tamsulosin (FLOMAX) 0.4 MG CAPS capsule, Take 1 capsule (0.4 mg total) by mouth daily., Disp: 30 capsule, Rfl: 11 .  trimethoprim-polymyxin b (POLYTRIM) ophthalmic solution, Place 2 drops into the left eye every 4 (four) hours., Disp: 10 mL, Rfl: 0    Review of Systems  Constitutional: Negative for activity change, appetite change, chills, diaphoresis, fatigue, fever and unexpected weight change.  HENT: Positive for postnasal drip. Negative for sinus pressure and trouble swallowing.   Eyes: Negative for photophobia, discharge, itching and visual disturbance.  Respiratory: Negative for chest tightness, shortness of breath and stridor.   Cardiovascular: Negative for palpitations and leg swelling.  Gastrointestinal: Negative for abdominal distention, anal bleeding, blood in stool and constipation.  Genitourinary: Negative for difficulty urinating, flank pain and hematuria.  Musculoskeletal: Negative for arthralgias, back pain, gait problem, joint swelling and myalgias.  Skin: Negative for color change, pallor and wound.  Neurological: Negative for dizziness, tremors, weakness and light-headedness.  Hematological: Negative for adenopathy. Does not bruise/bleed easily.  Psychiatric/Behavioral:  Negative for agitation, behavioral problems, confusion, decreased concentration, dysphoric mood and sleep disturbance.   Example of rash on legs 04/21/15:        Objective:   Physical Exam  Constitutional: He is oriented to person, place, and time. He appears well-developed and well-nourished. No distress.  HENT:  Head: Normocephalic and atraumatic.  Mouth/Throat: Uvula is midline.  Eyes: Conjunctivae and EOM are normal. Pupils are equal, round, and reactive to light. No scleral icterus.   Neck: Normal range of motion. Neck supple. No JVD present.  Cardiovascular: Normal rate, regular rhythm and normal heart sounds.  Exam reveals no gallop and no friction rub.   No murmur heard. Pulmonary/Chest: Effort normal and breath sounds normal. No respiratory distress. He has no wheezes. He has no rales. He exhibits no tenderness.  Abdominal: He exhibits no distension and no mass. There is no tenderness. There is no rebound and no guarding.  Musculoskeletal: He exhibits no edema or tenderness.  Lymphadenopathy:    He has no cervical adenopathy.  Neurological: He is alert and oriented to person, place, and time. He has normal reflexes. He exhibits normal muscle tone. Coordination normal.  Skin: Skin is warm and dry. He is not diaphoretic. No erythema. No pallor.  Psychiatric: He has a normal mood and affect. His behavior is normal. Judgment and thought content normal.          Assessment & Plan:   HIV:  continue Genvoya for better bone and kidney safety, change to BIKTARVY down the road  Smoking: he dislikes chantix, He will continue to make efforts to quit, emphasized this again and how his body his making more RBC reflected in his polycythemia secondary to smoking    Hypertension: BP up still not on flomax. Have asked him to engage in PCP. If still up at followup with research  will add thiazide  Vitals:   05/26/16 1041  BP: (!) 154/84  Pulse: 89  Temp: 98.6 F (37 C)    BPH: Refill Flomax     I spent greater than 25 minutes with the patient including greater than 50% of time in face to face counsel of the patient re his new ARV regimen, his HTN, smoking, BPH, and in coordination of his care.

## 2016-06-02 ENCOUNTER — Other Ambulatory Visit: Payer: Self-pay | Admitting: *Deleted

## 2016-06-02 DIAGNOSIS — R351 Nocturia: Principal | ICD-10-CM

## 2016-06-02 DIAGNOSIS — N401 Enlarged prostate with lower urinary tract symptoms: Secondary | ICD-10-CM

## 2016-06-02 MED ORDER — TAMSULOSIN HCL 0.4 MG PO CAPS: 0.4000 mg | ORAL_CAPSULE | Freq: Every day | ORAL | 3 refills | Status: DC

## 2016-06-21 ENCOUNTER — Other Ambulatory Visit: Payer: Self-pay | Admitting: Infectious Disease

## 2016-06-24 ENCOUNTER — Other Ambulatory Visit: Payer: Self-pay | Admitting: Infectious Disease

## 2016-06-25 ENCOUNTER — Encounter: Payer: Self-pay | Admitting: *Deleted

## 2016-12-16 ENCOUNTER — Ambulatory Visit (INDEPENDENT_AMBULATORY_CARE_PROVIDER_SITE_OTHER): Payer: Medicare Other | Admitting: Family Medicine

## 2016-12-16 DIAGNOSIS — Z23 Encounter for immunization: Secondary | ICD-10-CM | POA: Diagnosis not present

## 2017-01-03 ENCOUNTER — Telehealth: Payer: Self-pay | Admitting: Pharmacist Clinician (PhC)/ Clinical Pharmacy Specialist

## 2017-01-03 NOTE — Telephone Encounter (Signed)
[name] handed me a fax from [facility] to say that [name] may have not getting all of his refills on time. Called [facility] and they said that Sept was the only month that they couldn't ship out. After talking with [name] directly, he said that he was laid off after [employer] merged with [employer]. He has to be on Cobra. They have been sending him his meds, therefore, he has not had any gaps in therapy. His Cobra will expired in March of next year. [name] set him up an appt to see Dr. Tommy Medal in Jan, we will do his ADAP then.

## 2017-01-03 NOTE — Telephone Encounter (Signed)
Thanks Minh! 

## 2017-01-08 ENCOUNTER — Encounter: Payer: Self-pay | Admitting: Infectious Disease

## 2017-02-02 ENCOUNTER — Other Ambulatory Visit: Payer: Medicare Other

## 2017-02-02 ENCOUNTER — Ambulatory Visit: Payer: Medicare Other

## 2017-02-02 DIAGNOSIS — B20 Human immunodeficiency virus [HIV] disease: Secondary | ICD-10-CM

## 2017-02-03 LAB — CBC WITH DIFFERENTIAL/PLATELET
BASOS ABS: 19 {cells}/uL (ref 0–200)
Basophils Relative: 0.4 %
EOS ABS: 71 {cells}/uL (ref 15–500)
EOS PCT: 1.5 %
HEMATOCRIT: 44.4 % (ref 38.5–50.0)
HEMOGLOBIN: 15.2 g/dL (ref 13.2–17.1)
Lymphs Abs: 1236 cells/uL (ref 850–3900)
MCH: 31 pg (ref 27.0–33.0)
MCHC: 34.2 g/dL (ref 32.0–36.0)
MCV: 90.6 fL (ref 80.0–100.0)
MONOS PCT: 9.3 %
MPV: 10.3 fL (ref 7.5–12.5)
NEUTROS PCT: 62.5 %
Neutro Abs: 2938 cells/uL (ref 1500–7800)
Platelets: 234 10*3/uL (ref 140–400)
RBC: 4.9 10*6/uL (ref 4.20–5.80)
RDW: 11.7 % (ref 11.0–15.0)
Total Lymphocyte: 26.3 %
WBC mixed population: 437 cells/uL (ref 200–950)
WBC: 4.7 10*3/uL (ref 3.8–10.8)

## 2017-02-03 LAB — COMPLETE METABOLIC PANEL WITH GFR
AG RATIO: 1.1 (calc) (ref 1.0–2.5)
ALT: 16 U/L (ref 9–46)
AST: 19 U/L (ref 10–35)
Albumin: 3.8 g/dL (ref 3.6–5.1)
Alkaline phosphatase (APISO): 79 U/L (ref 40–115)
BUN: 11 mg/dL (ref 7–25)
CALCIUM: 9.2 mg/dL (ref 8.6–10.3)
CO2: 26 mmol/L (ref 20–32)
CREATININE: 1.18 mg/dL (ref 0.70–1.25)
Chloride: 102 mmol/L (ref 98–110)
GFR, EST NON AFRICAN AMERICAN: 64 mL/min/{1.73_m2} (ref >=60)
GFR, Est African American: 75 mL/min/{1.73_m2} (ref >=60)
Globulin: 3.5 g/dL (calc) (ref 1.9–3.7)
Glucose, Bld: 95 mg/dL (ref 65–99)
POTASSIUM: 4.9 mmol/L (ref 3.5–5.3)
Sodium: 135 mmol/L (ref 135–146)
Total Bilirubin: 0.9 mg/dL (ref 0.2–1.2)
Total Protein: 7.3 g/dL (ref 6.1–8.1)

## 2017-02-03 LAB — T-HELPER CELL (CD4) - (RCID CLINIC ONLY)
CD4 T CELL ABS: 290 /uL — AB (ref 400–2700)
CD4 T CELL HELPER: 20 % — AB (ref 33–55)

## 2017-02-04 ENCOUNTER — Encounter: Payer: Self-pay | Admitting: Infectious Disease

## 2017-02-04 LAB — HIV-1 RNA QUANT-NO REFLEX-BLD
HIV 1 RNA QUANT: 23 {copies}/mL — AB
HIV-1 RNA Quant, Log: 1.36 Log copies/mL — ABNORMAL HIGH

## 2017-02-16 ENCOUNTER — Encounter: Payer: Self-pay | Admitting: Infectious Disease

## 2017-02-16 ENCOUNTER — Ambulatory Visit (INDEPENDENT_AMBULATORY_CARE_PROVIDER_SITE_OTHER): Payer: Medicare Other | Admitting: Infectious Disease

## 2017-02-16 VITALS — BP 149/89 | HR 85 | Temp 98.0°F | Ht 70.0 in | Wt 193.0 lb

## 2017-02-16 DIAGNOSIS — B20 Human immunodeficiency virus [HIV] disease: Secondary | ICD-10-CM

## 2017-02-16 DIAGNOSIS — Z79899 Other long term (current) drug therapy: Secondary | ICD-10-CM

## 2017-02-16 DIAGNOSIS — Z113 Encounter for screening for infections with a predominantly sexual mode of transmission: Secondary | ICD-10-CM | POA: Diagnosis not present

## 2017-02-16 NOTE — Progress Notes (Signed)
Subjective:   Chief complaint: followup for his HIV on medications    Patient ID: [name], male    DOB: April 06, 1950, 67 y.o.   MRN: [id number]  HPI  56 -year-old Caucasian male with HIV that has been superbly well controlled on Atripla --> STRIBILD-->  GENVOYA returns for routine followup.  He has continued near perfect virological control and immune system is doing well.  He has no specific complaints he is trying to ensure that he has coverage for his medications and may potentially renew with [facility].  He tells me he will make sure that he does this in the summertime but would like to see me on a yearly basis rather than twice yearly  Lab Results  Component Value Date   HIV1RNAQUANT 23 (H) 02/02/2017   HIV1RNAQUANT 43 (H) 05/10/2016   HIV1RNAQUANT 21 04/07/2015     Lab Results  Component Value Date   CD4TABS 290 (L) 02/02/2017   CD4TABS 370 (L) 05/10/2016   CD4TABS 200 (L) 04/07/2015      Past Medical History:  Diagnosis Date  . BPH (benign prostatic hyperplasia)   . Erectile dysfunction   . HIV infection ([place])   . Pruritic rash 04/21/2015  . Secondary polycythemia 05/26/2016    Past Surgical History:  Procedure Laterality Date  . PARTIAL COLECTOMY      Family History  Problem Relation Age of Onset  . Heart failure Mother   . Stroke Father       Social History   Socioeconomic History  . Marital status: Single    Spouse name: None  . Number of children: None  . Years of education: None  . Highest education level: None  Social Needs  . Financial resource strain: None  . Food insecurity - worry: None  . Food insecurity - inability: None  . Transportation needs - medical: None  . Transportation needs - non-medical: None  Occupational History  . None  Tobacco Use  . Smoking status: Former Smoker    Packs/day: 0.80    Types: Cigarettes    Start date: 08/01/2016  . Smokeless tobacco: Never Used  . Tobacco comment: 1/2 pack daily  Substance and  Sexual Activity  . Alcohol use: Yes    Alcohol/week: 0.0 oz    Comment: social  . Drug use: No  . Sexual activity: No    Comment: given condoms  Other Topics Concern  . None  Social History Narrative  . None    No Known Allergies   Current Outpatient Medications:  .  elvitegravir-cobicistat-emtricitabine-tenofovir (GENVOYA) 150-150-200-10 MG TABS tablet, Take 1 tablet by mouth once per day with breakfast, Disp: 30 tablet, Rfl: 11 .  tamsulosin (FLOMAX) 0.4 MG CAPS capsule, take 1 capsule by mouth once daily, Disp: 30 capsule, Rfl: 11 .  sildenafil (VIAGRA) 100 MG tablet, Take 1 tablet (100 mg total) by mouth daily as needed for erectile dysfunction. (Patient not taking: Reported on 02/16/2017), Disp: 30 tablet, Rfl: 4 .  trimethoprim-polymyxin b (POLYTRIM) ophthalmic solution, Place 2 drops into the left eye every 4 (four) hours. (Patient not taking: Reported on 02/16/2017), Disp: 10 mL, Rfl: 0    Review of Systems  Constitutional: Negative for activity change, appetite change, chills, diaphoresis, fatigue, fever and unexpected weight change.  HENT: Negative for postnasal drip, sinus pressure and trouble swallowing.   Eyes: Negative for photophobia, discharge, itching and visual disturbance.  Respiratory: Negative for chest tightness, shortness of breath and stridor.  Cardiovascular: Negative for palpitations and leg swelling.  Gastrointestinal: Negative for abdominal distention, anal bleeding, blood in stool and constipation.  Genitourinary: Negative for difficulty urinating, flank pain and hematuria.  Musculoskeletal: Negative for arthralgias, back pain, gait problem, joint swelling and myalgias.  Skin: Negative for color change, pallor and wound.  Neurological: Negative for dizziness, tremors, weakness and light-headedness.  Hematological: Negative for adenopathy. Does not bruise/bleed easily.  Psychiatric/Behavioral: Negative for agitation, behavioral problems, confusion,  decreased concentration, dysphoric mood and sleep disturbance.   Example of rash on legs 04/21/15:        Objective:   Physical Exam  Constitutional: He is oriented to person, place, and time. He appears well-developed and well-nourished. No distress.  HENT:  Head: Normocephalic and atraumatic.  Mouth/Throat: Uvula is midline.  Eyes: Conjunctivae and EOM are normal. Pupils are equal, round, and reactive to light. No scleral icterus.  Neck: Normal range of motion. Neck supple. No JVD present.  Cardiovascular: Normal rate and regular rhythm.  Pulmonary/Chest: Effort normal. No respiratory distress. He has no wheezes.  Abdominal: He exhibits no distension.  Musculoskeletal: He exhibits no edema or tenderness.  Lymphadenopathy:    He has no cervical adenopathy.  Neurological: He is alert and oriented to person, place, and time. He has normal reflexes. He exhibits normal muscle tone. Coordination normal.  Skin: Skin is warm and dry. He is not diaphoretic. No erythema. No pallor.  Psychiatric: He has a normal mood and affect. His behavior is normal. Judgment and thought content normal.          Assessment & Plan:   HIV:  continue Genvoya for better bone and kidney safety, etc. change to BIKTARVY to reduce risks of drug drug interaction and future    Hypertension: needs to engage with PCP as BP still not optimal  Vitals:   02/16/17 0916  BP: (!) 149/89  Pulse: 85  Temp: 98 F (36.7 C)    BPH: Flomax

## 2017-02-21 ENCOUNTER — Telehealth: Payer: Self-pay

## 2017-02-21 NOTE — Telephone Encounter (Signed)
Called [name], who was previous Dr. Everlene Farrier [name] to see about getting him scheduled with new provider and for an AWV. Checked [place] and [name] is due Nov. 2019 but I do not see a previous AWV in the chart and [name] says he has not had wellness elsewhere. [name] agreed to a call later this year to get scheduled for AWV and requested that I update his PCP in the chart to Dr. Carlota Raspberry, who is listed as his PCP on his insurance card and who he has seen once before.    Josepha Pigg, B.A.  Care Guide - [facility]

## 2017-05-14 ENCOUNTER — Other Ambulatory Visit: Payer: Self-pay | Admitting: Infectious Disease

## 2017-05-14 DIAGNOSIS — B20 Human immunodeficiency virus [HIV] disease: Secondary | ICD-10-CM

## 2017-05-15 ENCOUNTER — Other Ambulatory Visit: Payer: Self-pay | Admitting: Infectious Disease

## 2017-05-15 DIAGNOSIS — B20 Human immunodeficiency virus [HIV] disease: Secondary | ICD-10-CM

## 2017-08-01 ENCOUNTER — Ambulatory Visit: Payer: Medicare Other

## 2017-08-10 ENCOUNTER — Ambulatory Visit: Payer: Self-pay

## 2017-08-23 ENCOUNTER — Other Ambulatory Visit: Payer: Self-pay | Admitting: Infectious Disease

## 2017-08-24 ENCOUNTER — Encounter: Payer: Self-pay | Admitting: Infectious Disease

## 2017-08-28 ENCOUNTER — Encounter: Payer: Self-pay | Admitting: Infectious Disease

## 2017-08-29 ENCOUNTER — Other Ambulatory Visit: Payer: Self-pay | Admitting: *Deleted

## 2017-08-29 DIAGNOSIS — N401 Enlarged prostate with lower urinary tract symptoms: Secondary | ICD-10-CM

## 2017-08-29 DIAGNOSIS — R351 Nocturia: Principal | ICD-10-CM

## 2017-08-29 MED ORDER — TAMSULOSIN HCL 0.4 MG PO CAPS: 0.4000 mg | ORAL_CAPSULE | Freq: Every day | ORAL | 2 refills | Status: DC

## 2017-09-12 ENCOUNTER — Other Ambulatory Visit: Payer: Self-pay | Admitting: Infectious Disease

## 2017-09-12 DIAGNOSIS — B20 Human immunodeficiency virus [HIV] disease: Secondary | ICD-10-CM

## 2017-10-11 ENCOUNTER — Other Ambulatory Visit: Payer: Self-pay | Admitting: Infectious Disease

## 2017-10-11 DIAGNOSIS — B20 Human immunodeficiency virus [HIV] disease: Secondary | ICD-10-CM

## 2017-11-11 ENCOUNTER — Other Ambulatory Visit: Payer: Self-pay | Admitting: Infectious Disease

## 2017-11-11 DIAGNOSIS — B20 Human immunodeficiency virus [HIV] disease: Secondary | ICD-10-CM

## 2017-11-22 ENCOUNTER — Other Ambulatory Visit: Payer: Self-pay | Admitting: Infectious Disease

## 2017-11-22 DIAGNOSIS — R351 Nocturia: Principal | ICD-10-CM

## 2017-11-22 DIAGNOSIS — N401 Enlarged prostate with lower urinary tract symptoms: Secondary | ICD-10-CM

## 2017-12-23 ENCOUNTER — Other Ambulatory Visit: Payer: Self-pay | Admitting: Infectious Disease

## 2017-12-23 DIAGNOSIS — N401 Enlarged prostate with lower urinary tract symptoms: Secondary | ICD-10-CM

## 2017-12-23 DIAGNOSIS — R351 Nocturia: Principal | ICD-10-CM

## 2018-01-23 ENCOUNTER — Other Ambulatory Visit: Payer: Self-pay | Admitting: Infectious Disease

## 2018-01-23 ENCOUNTER — Telehealth: Payer: Self-pay

## 2018-01-23 DIAGNOSIS — N401 Enlarged prostate with lower urinary tract symptoms: Secondary | ICD-10-CM

## 2018-01-23 DIAGNOSIS — R351 Nocturia: Principal | ICD-10-CM

## 2018-01-23 NOTE — Telephone Encounter (Signed)
error 

## 2018-02-07 ENCOUNTER — Other Ambulatory Visit: Payer: Self-pay | Admitting: Infectious Disease

## 2018-02-07 DIAGNOSIS — B20 Human immunodeficiency virus [HIV] disease: Secondary | ICD-10-CM

## 2018-02-23 ENCOUNTER — Ambulatory Visit: Payer: Medicare Other

## 2018-02-24 ENCOUNTER — Other Ambulatory Visit: Payer: Self-pay | Admitting: Internal Medicine

## 2018-02-24 DIAGNOSIS — R351 Nocturia: Principal | ICD-10-CM

## 2018-02-24 DIAGNOSIS — N401 Enlarged prostate with lower urinary tract symptoms: Secondary | ICD-10-CM

## 2018-03-10 ENCOUNTER — Other Ambulatory Visit: Payer: Self-pay | Admitting: Infectious Disease

## 2018-03-10 ENCOUNTER — Telehealth: Payer: Self-pay

## 2018-03-10 DIAGNOSIS — B20 Human immunodeficiency virus [HIV] disease: Secondary | ICD-10-CM

## 2018-03-10 NOTE — Telephone Encounter (Signed)
Called patient after receiving refill request for patient's Genvoya.Patient has not been scheduled for his one year follow-up; was last seen on 02/16/2017. Left voicemail for patient to call office back to schedule appointment for lab work and office visit two weeks after. [place]

## 2018-03-15 ENCOUNTER — Other Ambulatory Visit: Payer: Self-pay

## 2018-03-15 DIAGNOSIS — B20 Human immunodeficiency virus [HIV] disease: Secondary | ICD-10-CM

## 2018-03-15 MED ORDER — ELVITEG-COBIC-EMTRICIT-TENOFAF 150-150-200-10 MG PO TABS: ORAL_TABLET | ORAL | 0 refills | Status: DC

## 2018-03-15 NOTE — Telephone Encounter (Signed)
Received refill request for patient's Genvoya via fax. Patient was last seen by Dr. Tommy Medal on 02/16/17, and has not scheduled a follow-up appointment for 2020. Left second voicemail requesting patient call office to schedule appointment with lab/ Dr, Lucianne Lei dam.  Aundria Rud, CMA

## 2018-03-22 ENCOUNTER — Encounter: Payer: Self-pay | Admitting: Infectious Disease

## 2018-03-28 ENCOUNTER — Other Ambulatory Visit: Payer: Self-pay | Admitting: Infectious Disease

## 2018-03-28 DIAGNOSIS — R351 Nocturia: Principal | ICD-10-CM

## 2018-03-28 DIAGNOSIS — N401 Enlarged prostate with lower urinary tract symptoms: Secondary | ICD-10-CM

## 2018-03-31 ENCOUNTER — Other Ambulatory Visit: Payer: Medicare Other

## 2018-03-31 DIAGNOSIS — B20 Human immunodeficiency virus [HIV] disease: Secondary | ICD-10-CM

## 2018-03-31 DIAGNOSIS — Z113 Encounter for screening for infections with a predominantly sexual mode of transmission: Secondary | ICD-10-CM

## 2018-03-31 DIAGNOSIS — Z79899 Other long term (current) drug therapy: Secondary | ICD-10-CM

## 2018-03-31 LAB — T-HELPER CELL (CD4) - (RCID CLINIC ONLY)
CD4 T CELL HELPER: 24 % — AB (ref 33–55)
CD4 T Cell Abs: 380 /uL — ABNORMAL LOW (ref 400–2700)

## 2018-04-03 LAB — CBC WITH DIFFERENTIAL/PLATELET
ABSOLUTE MONOCYTES: 382 {cells}/uL (ref 200–950)
BASOS PCT: 0.4 %
Basophils Absolute: 21 cells/uL (ref 0–200)
EOS ABS: 90 {cells}/uL (ref 15–500)
Eosinophils Relative: 1.7 %
HCT: 53.5 % — ABNORMAL HIGH (ref 38.5–50.0)
HEMOGLOBIN: 18.4 g/dL — AB (ref 13.2–17.1)
LYMPHS ABS: 1526 {cells}/uL (ref 850–3900)
MCH: 32.6 pg (ref 27.0–33.0)
MCHC: 34.4 g/dL (ref 32.0–36.0)
MCV: 94.7 fL (ref 80.0–100.0)
MONOS PCT: 7.2 %
MPV: 10.7 fL (ref 7.5–12.5)
NEUTROS ABS: 3281 {cells}/uL (ref 1500–7800)
Neutrophils Relative %: 61.9 %
Platelets: 185 10*3/uL (ref 140–400)
RBC: 5.65 10*6/uL (ref 4.20–5.80)
RDW: 13.5 % (ref 11.0–15.0)
Total Lymphocyte: 28.8 %
WBC: 5.3 10*3/uL (ref 3.8–10.8)

## 2018-04-03 LAB — COMPLETE METABOLIC PANEL WITH GFR
AG Ratio: 1.6 (calc) (ref 1.0–2.5)
ALBUMIN MSPROF: 4.4 g/dL (ref 3.6–5.1)
ALKALINE PHOSPHATASE (APISO): 64 U/L (ref 35–144)
ALT: 23 U/L (ref 9–46)
AST: 20 U/L (ref 10–35)
BUN: 12 mg/dL (ref 7–25)
CO2: 24 mmol/L (ref 20–32)
CREATININE: 1.18 mg/dL (ref 0.70–1.25)
Calcium: 9.5 mg/dL (ref 8.6–10.3)
Chloride: 105 mmol/L (ref 98–110)
GFR, EST NON AFRICAN AMERICAN: 64 mL/min/{1.73_m2} (ref >=60)
GFR, Est African American: 74 mL/min/{1.73_m2} (ref >=60)
GLUCOSE: 95 mg/dL (ref 65–99)
Globulin: 2.7 g/dL (calc) (ref 1.9–3.7)
Potassium: 4.5 mmol/L (ref 3.5–5.3)
Sodium: 139 mmol/L (ref 135–146)
TOTAL PROTEIN: 7.1 g/dL (ref 6.1–8.1)
Total Bilirubin: 0.7 mg/dL (ref 0.2–1.2)

## 2018-04-03 LAB — FLUORESCENT TREPONEMAL AB(FTA)-IGG-BLD: FLUORESCENT TREPONEMAL ABS: REACTIVE — AB

## 2018-04-03 LAB — HIV-1 RNA QUANT-NO REFLEX-BLD
HIV 1 RNA Quant: <20 copies/mL — AB
HIV-1 RNA Quant, Log: <1.3 Log copies/mL — AB

## 2018-04-03 LAB — RPR: RPR Ser Ql: REACTIVE — AB

## 2018-04-03 LAB — LIPID PANEL
Cholesterol: 147 mg/dL (ref <200)
HDL: 40 mg/dL (ref >=40)
LDL CHOLESTEROL (CALC): 84 mg/dL
Non-HDL Cholesterol (Calc): 107 mg/dL (calc) (ref <130)
Total CHOL/HDL Ratio: 3.7 (calc) (ref <5.0)
Triglycerides: 129 mg/dL (ref <150)

## 2018-04-03 LAB — RPR TITER: RPR Titer: 1:32 {titer} — ABNORMAL HIGH

## 2018-04-04 ENCOUNTER — Telehealth: Payer: Self-pay | Admitting: Behavioral Health

## 2018-04-04 NOTE — Telephone Encounter (Signed)
-----   Message from Aaron Hayward, MD sent at 04/03/2018  1:31 PM EST ----- Patient with high syphilis titer he needs 2,400,000 units of penicillin IM weekly x3 weeks.  Partners need to be treated and tested.   HIV seronegative partners need to be referred for preexposure prophylaxis

## 2018-04-04 NOTE — Telephone Encounter (Signed)
Called patient, left voice mail for him to call the office back.  Left the call back number. Pricilla Riffle RN

## 2018-04-04 NOTE — Telephone Encounter (Signed)
Patient returned call.  Verified identity.  Informed him per Dr. Tommy Medal that he has a high Syphilis Titer and needs to come in for treatment.  Explained treatment is with 2,400,000 units of PCN IM x3 weeks.  Also informed him partners need to be tested and treated at the health department. Patient verbalized understanding. Pricilla Riffle RN

## 2018-04-04 NOTE — Telephone Encounter (Signed)
Perfect thanks much Caryl Pina

## 2018-04-05 ENCOUNTER — Ambulatory Visit (INDEPENDENT_AMBULATORY_CARE_PROVIDER_SITE_OTHER): Payer: Medicare Other

## 2018-04-05 DIAGNOSIS — A64 Unspecified sexually transmitted disease: Secondary | ICD-10-CM

## 2018-04-05 MED ORDER — PENICILLIN G BENZATHINE 1200000 UNIT/2ML IM SUSP
2.4000 10*6.[IU] | Freq: Once | INTRAMUSCULAR | Status: AC
  Administered 2018-04-05: 2.4 10*6.[IU] via INTRAMUSCULAR

## 2018-04-05 NOTE — Progress Notes (Signed)
Patient in office today for 1 of 3 Bicillin 2.4 IM injection today. Patient advised to make any recent partner aware to be tested and treated at local Health [facility]. Patient accepted condoms. Patient also encourage to avoid any sexual contact to ensure proper treatment. Patient remained in office for up to 10 minutes to ensure no adverse reactions. Patient tolerated injections well. RN in the room during injection administration for chaperone. Eugenia Mcalpine, LPN

## 2018-04-12 ENCOUNTER — Other Ambulatory Visit: Payer: Self-pay | Admitting: Infectious Disease

## 2018-04-12 ENCOUNTER — Ambulatory Visit (INDEPENDENT_AMBULATORY_CARE_PROVIDER_SITE_OTHER): Payer: Medicare Other | Admitting: *Deleted

## 2018-04-12 ENCOUNTER — Other Ambulatory Visit: Payer: Self-pay

## 2018-04-12 DIAGNOSIS — A539 Syphilis, unspecified: Secondary | ICD-10-CM | POA: Diagnosis present

## 2018-04-12 DIAGNOSIS — B20 Human immunodeficiency virus [HIV] disease: Secondary | ICD-10-CM

## 2018-04-12 MED ORDER — PENICILLIN G BENZATHINE 1200000 UNIT/2ML IM SUSP
1.2000 10*6.[IU] | Freq: Once | INTRAMUSCULAR | Status: AC
  Administered 2018-04-12: 1.2 10*6.[IU] via INTRAMUSCULAR

## 2018-04-12 NOTE — Telephone Encounter (Signed)
Pending refill patient has upcoming appointment

## 2018-04-19 ENCOUNTER — Encounter: Payer: Self-pay | Admitting: Infectious Disease

## 2018-04-19 ENCOUNTER — Other Ambulatory Visit: Payer: Self-pay

## 2018-04-19 ENCOUNTER — Ambulatory Visit (INDEPENDENT_AMBULATORY_CARE_PROVIDER_SITE_OTHER): Payer: Medicare Other | Admitting: Infectious Disease

## 2018-04-19 ENCOUNTER — Other Ambulatory Visit (HOSPITAL_COMMUNITY)
Admission: RE | Admit: 2018-04-19 | Discharge: 2018-04-19 | Disposition: A | Discharge status: Home / Self Care | Payer: Medicare Other | Source: Ambulatory Visit | Attending: Infectious Disease | Admitting: Infectious Disease

## 2018-04-19 VITALS — BP 157/86 | HR 80 | Temp 98.0°F | Wt 186.0 lb

## 2018-04-19 DIAGNOSIS — A539 Syphilis, unspecified: Secondary | ICD-10-CM

## 2018-04-19 DIAGNOSIS — Z79899 Other long term (current) drug therapy: Secondary | ICD-10-CM | POA: Diagnosis not present

## 2018-04-19 DIAGNOSIS — I10 Essential (primary) hypertension: Secondary | ICD-10-CM | POA: Diagnosis present

## 2018-04-19 DIAGNOSIS — B20 Human immunodeficiency virus [HIV] disease: Secondary | ICD-10-CM | POA: Diagnosis present

## 2018-04-19 DIAGNOSIS — Z113 Encounter for screening for infections with a predominantly sexual mode of transmission: Secondary | ICD-10-CM

## 2018-04-19 HISTORY — DX: Syphilis, unspecified: A53.9

## 2018-04-19 MED ORDER — PENICILLIN G BENZATHINE 1200000 UNIT/2ML IM SUSP
2.4000 10*6.[IU] | Freq: Once | INTRAMUSCULAR | Status: AC
  Administered 2018-04-19: 2.4 10*6.[IU] via INTRAMUSCULAR

## 2018-04-19 NOTE — Progress Notes (Signed)
Patient received 3/3 bicillin injection during office visit.  CMA present in the room for chaperone. Patient tolerated injection well. Refused condoms.

## 2018-04-19 NOTE — Progress Notes (Signed)
Subjective:   Chief complaint: followup for his HIV on medications.,  Concerned why he was not tested for syphilis 1 year previously.    Patient ID: [name], male    DOB: 30-Jan-1951, 68 y.o.   MRN: [id number]  HPI  43 -year-old Caucasian male with HIV that has been superbly well controlled on Atripla --> STRIBILD-->  GENVOYA returns for routine followup.  He has continued near perfect virological control and immune system is doing well.  He was diagnosed with syphilis with a positive RPR and is now receiving his third intramuscular dose of penicillin today.  We will also check in for gonorrhea chlamydia with a swab of oropharynx rectum and urine test.       Past Medical History:  Diagnosis Date  . BPH (benign prostatic hyperplasia)   . Erectile dysfunction   . HIV infection ([place])   . Pruritic rash 04/21/2015  . Secondary polycythemia 05/26/2016  . Syphilis 04/19/2018    Past Surgical History:  Procedure Laterality Date  . PARTIAL COLECTOMY      Family History  Problem Relation Age of Onset  . Heart failure Mother   . Stroke Father       Social History   Socioeconomic History  . Marital status: Single    Spouse name: Not on file  . Number of children: Not on file  . Years of education: Not on file  . Highest education level: Not on file  Occupational History  . Not on file  Social Needs  . Financial resource strain: Not on file  . Food insecurity:    Worry: Not on file    Inability: Not on file  . Transportation needs:    Medical: Not on file    Non-medical: Not on file  Tobacco Use  . Smoking status: Former Smoker    Packs/day: 0.80    Types: Cigarettes    Start date: 08/01/2016  . Smokeless tobacco: Never Used  . Tobacco comment: 1/2 pack daily  Substance and Sexual Activity  . Alcohol use: Yes    Alcohol/week: 0.0 standard drinks    Comment: social  . Drug use: No  . Sexual activity: Never    Comment: given condoms  Lifestyle  . Physical  activity:    Days per week: Not on file    Minutes per session: Not on file  . Stress: Not on file  Relationships  . Social connections:    Talks on phone: Not on file    Gets together: Not on file    Attends religious service: Not on file    Active member of club or organization: Not on file    Attends meetings of clubs or organizations: Not on file    Relationship status: Not on file  Other Topics Concern  . Not on file  Social History Narrative  . Not on file    No Known Allergies   Current Outpatient Medications:  Marland Kitchen  GENVOYA 150-150-200-10 MG TABS tablet, TAKE 1 TABLET BY MOUTH EVERY DAY WITH BREAKFAST, Disp: 30 tablet, Rfl: 0 .  sildenafil (VIAGRA) 100 MG tablet, Take 1 tablet (100 mg total) by mouth daily as needed for erectile dysfunction. (Patient not taking: Reported on 02/16/2017), Disp: 30 tablet, Rfl: 4 .  tamsulosin (FLOMAX) 0.4 MG CAPS capsule, TAKE 1 CAPSULE BY MOUTH DAILY, Disp: 30 capsule, Rfl: 0 .  trimethoprim-polymyxin b (POLYTRIM) ophthalmic solution, Place 2 drops into the left eye every 4 (four) hours. (Patient  not taking: Reported on 02/16/2017), Disp: 10 mL, Rfl: 0    Review of Systems  Constitutional: Negative for activity change, appetite change, chills, diaphoresis, fatigue, fever and unexpected weight change.  HENT: Negative for postnasal drip, sinus pressure and trouble swallowing.   Eyes: Negative for photophobia, discharge, itching and visual disturbance.  Respiratory: Negative for chest tightness, shortness of breath and stridor.   Cardiovascular: Negative for palpitations and leg swelling.  Gastrointestinal: Negative for abdominal distention, anal bleeding, blood in stool and constipation.  Genitourinary: Negative for difficulty urinating, flank pain and hematuria.  Musculoskeletal: Negative for arthralgias, back pain, gait problem, joint swelling and myalgias.  Skin: Negative for color change, pallor and wound.  Neurological: Negative for  dizziness, tremors, weakness and light-headedness.  Hematological: Negative for adenopathy. Does not bruise/bleed easily.  Psychiatric/Behavioral: Negative for agitation, behavioral problems, confusion, decreased concentration, dysphoric mood, self-injury and sleep disturbance.       Objective:   Physical Exam  Constitutional: He is oriented to person, place, and time. He appears well-developed and well-nourished. No distress.  HENT:  Head: Normocephalic and atraumatic.  Mouth/Throat: Uvula is midline.  Eyes: Conjunctivae and EOM are normal. No scleral icterus.  Neck: Normal range of motion. Neck supple. No JVD present.  Cardiovascular: Normal rate and regular rhythm.  Pulmonary/Chest: Effort normal. No respiratory distress. He has no wheezes.  Abdominal: He exhibits no distension.  Musculoskeletal:        General: No tenderness or edema.  Lymphadenopathy:    He has no cervical adenopathy.  Neurological: He is alert and oriented to person, place, and time. He has normal reflexes. He exhibits normal muscle tone. Coordination normal.  Skin: Skin is warm and dry. He is not diaphoretic. No erythema. No pallor.  Psychiatric: He has a normal mood and affect. His behavior is normal. Judgment and thought content normal.          Assessment & Plan:   HIV:  continue Genvoya for better bone and kidney safety, etc. change to BIKTARVY to reduce risks of drug drug interaction and future  Erectile dysfunction can continue his Viagra but at cautious doses given the cobicistat  Syphilis: Treat him with third dose of intramuscular penicillin and follow titers check for other STIs with gonorrhea chlamydia tested from urine and extra genital sites.  I spent greater than 25 minutes with the patient including greater than 50% of time in face to face counsel of the patient guarding the fact that while him being undetectable renders himself on transmissible as far as HIV is concerned it does not  prevent transmission of other sexually transmitted infections such as syphilis gonorrhea and chlamydia and in coordination of his care.

## 2018-04-20 LAB — CYTOLOGY, (ORAL, ANAL, URETHRAL) ANCILLARY ONLY
CHLAMYDIA, DNA PROBE: NEGATIVE
Chlamydia: NEGATIVE
Neisseria Gonorrhea: NEGATIVE
Neisseria Gonorrhea: NEGATIVE

## 2018-04-20 LAB — URINE CYTOLOGY ANCILLARY ONLY
Chlamydia: NEGATIVE
Neisseria Gonorrhea: NEGATIVE

## 2018-05-11 ENCOUNTER — Other Ambulatory Visit: Payer: Self-pay | Admitting: Infectious Disease

## 2018-05-11 DIAGNOSIS — B20 Human immunodeficiency virus [HIV] disease: Secondary | ICD-10-CM

## 2018-08-25 ENCOUNTER — Encounter: Payer: Self-pay | Admitting: Infectious Disease

## 2018-10-11 ENCOUNTER — Other Ambulatory Visit: Payer: Self-pay

## 2018-10-11 ENCOUNTER — Other Ambulatory Visit: Payer: Medicare Other

## 2018-10-11 DIAGNOSIS — Z113 Encounter for screening for infections with a predominantly sexual mode of transmission: Secondary | ICD-10-CM

## 2018-10-11 DIAGNOSIS — B20 Human immunodeficiency virus [HIV] disease: Secondary | ICD-10-CM

## 2018-10-11 DIAGNOSIS — I10 Essential (primary) hypertension: Secondary | ICD-10-CM

## 2018-10-11 DIAGNOSIS — Z79899 Other long term (current) drug therapy: Secondary | ICD-10-CM

## 2018-10-11 DIAGNOSIS — A539 Syphilis, unspecified: Secondary | ICD-10-CM

## 2018-10-12 LAB — T-HELPER CELL (CD4) - (RCID CLINIC ONLY)
CD4 % Helper T Cell: 26 % — ABNORMAL LOW (ref 33–65)
CD4 T Cell Abs: 355 /uL — ABNORMAL LOW (ref 400–1790)

## 2018-10-14 LAB — COMPLETE METABOLIC PANEL WITHOUT GFR
AG Ratio: 1.8 (calc) (ref 1.0–2.5)
ALT: 24 U/L (ref 9–46)
AST: 18 U/L (ref 10–35)
Albumin: 4.4 g/dL (ref 3.6–5.1)
Alkaline phosphatase (APISO): 59 U/L (ref 35–144)
BUN: 11 mg/dL (ref 7–25)
CO2: 29 mmol/L (ref 20–32)
Calcium: 9.6 mg/dL (ref 8.6–10.3)
Chloride: 104 mmol/L (ref 98–110)
Creat: 1.21 mg/dL (ref 0.70–1.25)
GFR, Est African American: 72 mL/min/{1.73_m2}
GFR, Est Non African American: 62 mL/min/{1.73_m2}
Globulin: 2.5 g/dL (ref 1.9–3.7)
Glucose, Bld: 95 mg/dL (ref 65–99)
Potassium: 4.9 mmol/L (ref 3.5–5.3)
Sodium: 139 mmol/L (ref 135–146)
Total Bilirubin: 0.5 mg/dL (ref 0.2–1.2)
Total Protein: 6.9 g/dL (ref 6.1–8.1)

## 2018-10-14 LAB — LIPID PANEL
Cholesterol: 148 mg/dL
HDL: 39 mg/dL — ABNORMAL LOW
LDL Cholesterol (Calc): 88 mg/dL
Non-HDL Cholesterol (Calc): 109 mg/dL
Total CHOL/HDL Ratio: 3.8 (calc)
Triglycerides: 110 mg/dL

## 2018-10-14 LAB — CBC WITH DIFFERENTIAL/PLATELET
Absolute Monocytes: 496 cells/uL (ref 200–950)
Basophils Absolute: 23 cells/uL (ref 0–200)
Basophils Relative: 0.4 %
Eosinophils Absolute: 108 cells/uL (ref 15–500)
Eosinophils Relative: 1.9 %
HCT: 49.6 % (ref 38.5–50.0)
Hemoglobin: 17.4 g/dL — ABNORMAL HIGH (ref 13.2–17.1)
Lymphs Abs: 1522 cells/uL (ref 850–3900)
MCH: 33.3 pg — ABNORMAL HIGH (ref 27.0–33.0)
MCHC: 35.1 g/dL (ref 32.0–36.0)
MCV: 95 fL (ref 80.0–100.0)
MPV: 10.5 fL (ref 7.5–12.5)
Monocytes Relative: 8.7 %
Neutro Abs: 3551 cells/uL (ref 1500–7800)
Neutrophils Relative %: 62.3 %
Platelets: 203 10*3/uL (ref 140–400)
RBC: 5.22 10*6/uL (ref 4.20–5.80)
RDW: 12.3 % (ref 11.0–15.0)
Total Lymphocyte: 26.7 %
WBC: 5.7 10*3/uL (ref 3.8–10.8)

## 2018-10-14 LAB — RPR TITER: RPR Titer: 1:8 {titer} — ABNORMAL HIGH

## 2018-10-14 LAB — SYPHILIS: RPR W/REFLEX TO RPR TITER AND TREPONEMAL ANTIBODIES, TRADITIONAL SCREENING AND DIAGNOSIS ALGORITHM: RPR Ser Ql: REACTIVE — AB

## 2018-10-14 LAB — FLUORESCENT TREPONEMAL AB(FTA)-IGG-BLD: Fluorescent Treponemal ABS: REACTIVE — AB

## 2018-10-14 LAB — HIV-1 RNA QUANT-NO REFLEX-BLD
HIV 1 RNA Quant: 27 copies/mL — ABNORMAL HIGH
HIV-1 RNA Quant, Log: 1.43 Log copies/mL — ABNORMAL HIGH

## 2018-10-23 ENCOUNTER — Other Ambulatory Visit: Payer: Self-pay

## 2018-10-23 ENCOUNTER — Ambulatory Visit (INDEPENDENT_AMBULATORY_CARE_PROVIDER_SITE_OTHER): Payer: Medicare Other | Admitting: Infectious Disease

## 2018-10-23 ENCOUNTER — Encounter: Payer: Self-pay | Admitting: Infectious Disease

## 2018-10-23 VITALS — BP 161/97 | HR 80 | Temp 98.1°F

## 2018-10-23 DIAGNOSIS — Z113 Encounter for screening for infections with a predominantly sexual mode of transmission: Secondary | ICD-10-CM | POA: Diagnosis not present

## 2018-10-23 DIAGNOSIS — B20 Human immunodeficiency virus [HIV] disease: Secondary | ICD-10-CM

## 2018-10-23 DIAGNOSIS — Z79899 Other long term (current) drug therapy: Secondary | ICD-10-CM | POA: Diagnosis not present

## 2018-10-23 DIAGNOSIS — A539 Syphilis, unspecified: Secondary | ICD-10-CM

## 2018-10-23 DIAGNOSIS — Z23 Encounter for immunization: Secondary | ICD-10-CM

## 2018-10-23 DIAGNOSIS — F17200 Nicotine dependence, unspecified, uncomplicated: Secondary | ICD-10-CM

## 2018-10-23 NOTE — Addendum Note (Signed)
Addended by: Lenore Cordia on: 10/23/2018 12:12 PM   Modules accepted: Orders

## 2018-10-23 NOTE — Progress Notes (Signed)
Subjective:   Chief complaint: followup for his HIV on medications.,  Turned about his syphilis titer still being +1-8   Patient ID: [name], male    DOB: 13-Mar-1950, 68 y.o.   MRN: [id number]  HPI  16 -year-old Caucasian male with HIV that has been superbly well controlled on Atripla --> STRIBILD-->  GENVOYA returns for routine followup.  He has continued near perfect virological control and immune system is doing well.  Was diagnosed with syphilis which we treated with 3 doses of intramuscular benzathine penicillin.  His titers are gone down from one third 33 to 1-8.  He does continue to smoke cigarettes which I like him to stop.        Past Medical History:  Diagnosis Date  . BPH (benign prostatic hyperplasia)   . Erectile dysfunction   . HIV infection ([place])   . Pruritic rash 04/21/2015  . Secondary polycythemia 05/26/2016  . Syphilis 04/19/2018    Past Surgical History:  Procedure Laterality Date  . PARTIAL COLECTOMY      Family History  Problem Relation Age of Onset  . Heart failure Mother   . Stroke Father       Social History   Socioeconomic History  . Marital status: Single    Spouse name: Not on file  . Number of children: Not on file  . Years of education: Not on file  . Highest education level: Not on file  Occupational History  . Not on file  Social Needs  . Financial resource strain: Not on file  . Food insecurity    Worry: Not on file    Inability: Not on file  . Transportation needs    Medical: Not on file    Non-medical: Not on file  Tobacco Use  . Smoking status: Former Smoker    Packs/day: 0.80    Types: Cigarettes    Start date: 08/01/2016  . Smokeless tobacco: Never Used  . Tobacco comment: 1/2 pack daily  Substance and Sexual Activity  . Alcohol use: Yes    Alcohol/week: 0.0 standard drinks    Comment: social  . Drug use: No  . Sexual activity: Never    Comment: given condoms  Lifestyle  . Physical activity   Days per week: Not on file    Minutes per session: Not on file  . Stress: Not on file  Relationships  . Social Herbalist on phone: Not on file    Gets together: Not on file    Attends religious service: Not on file    Active member of club or organization: Not on file    Attends meetings of clubs or organizations: Not on file    Relationship status: Not on file  Other Topics Concern  . Not on file  Social History Narrative  . Not on file    No Known Allergies   Current Outpatient Medications:  Marland Kitchen  GENVOYA 150-150-200-10 MG TABS tablet, TAKE 1 TABLET BY MOUTH EVERY DAY WITH BREAKFAST, Disp: 30 tablet, Rfl: 5 .  sildenafil (VIAGRA) 100 MG tablet, Take 1 tablet (100 mg total) by mouth daily as needed for erectile dysfunction., Disp: 30 tablet, Rfl: 4 .  tamsulosin (FLOMAX) 0.4 MG CAPS capsule, TAKE 1 CAPSULE BY MOUTH DAILY, Disp: 30 capsule, Rfl: 0 .  trimethoprim-polymyxin b (POLYTRIM) ophthalmic solution, Place 2 drops into the left eye every 4 (four) hours., Disp: 10 mL, Rfl: 0    Review of Systems  Constitutional: Negative for activity change, appetite change, chills, diaphoresis, fatigue, fever and unexpected weight change.  HENT: Negative for postnasal drip, sinus pressure and trouble swallowing.   Eyes: Negative for photophobia, discharge, itching and visual disturbance.  Respiratory: Negative for chest tightness, shortness of breath and stridor.   Cardiovascular: Negative for palpitations and leg swelling.  Gastrointestinal: Negative for abdominal distention, anal bleeding, blood in stool and constipation.  Genitourinary: Negative for difficulty urinating, flank pain and hematuria.  Musculoskeletal: Negative for arthralgias, back pain, gait problem, joint swelling and myalgias.  Skin: Negative for color change, pallor and wound.  Neurological: Negative for dizziness, tremors, weakness and light-headedness.  Hematological: Negative for adenopathy. Does not  bruise/bleed easily.  Psychiatric/Behavioral: Negative for agitation, behavioral problems, confusion, decreased concentration, dysphoric Blackwell, self-injury and sleep disturbance. The patient is not hyperactive.        Objective:   Physical Exam  Constitutional: He is oriented to person, place, and time. He appears well-developed and well-nourished. No distress.  HENT:  Head: Normocephalic and atraumatic.  Mouth/Throat: Uvula is midline.  Eyes: Conjunctivae and EOM are normal. No scleral icterus.  Neck: Normal range of motion. Neck supple. No JVD present. No tracheal deviation present.  Cardiovascular: Normal rate and regular rhythm.  Pulmonary/Chest: Effort normal. No respiratory distress. He has no wheezes.  Abdominal: He exhibits no distension.  Musculoskeletal:        General: No tenderness or edema.  Lymphadenopathy:    He has no cervical adenopathy.  Neurological: He is alert and oriented to person, place, and time. He has normal reflexes. He exhibits normal muscle tone. Coordination normal.  Skin: Skin is warm and dry. He is not diaphoretic. No erythema. No pallor.  Psychiatric: He has a normal Blackwell and affect. His behavior is normal. Judgment and thought content normal.          Assessment & Plan:   HIV: Continue Genvoya for now but consider switching to Biktarvy in the future  Erectile dysfunction can continue his Viagra as needed but cautiously dose based on renal function  Syphilis: Have diminished sufficiently we will recheck again we will see him in January  Smoking: Have urged him to stop smoking he tried Chantix before but this made him have weird dreams and he did not tolerate it nicotine patch did not work for him while either I suggested switching to vaping and see if he might build to titrate smoke again off altogether.  I spent greater than 25 minutes with the patient including greater than 50% of time in face to face counsel of the patient how we follow  syphilis titers for fourfold dilution, urging him to stop smoking, for long-term and short-term benefits including COVID prevention and in coordination of his care.

## 2018-10-24 ENCOUNTER — Other Ambulatory Visit: Payer: Self-pay | Admitting: Infectious Disease

## 2018-10-24 DIAGNOSIS — B20 Human immunodeficiency virus [HIV] disease: Secondary | ICD-10-CM

## 2018-12-04 ENCOUNTER — Other Ambulatory Visit: Payer: Self-pay

## 2018-12-04 ENCOUNTER — Encounter: Payer: Self-pay | Admitting: Family Medicine

## 2018-12-04 ENCOUNTER — Ambulatory Visit (INDEPENDENT_AMBULATORY_CARE_PROVIDER_SITE_OTHER): Payer: Medicare Other | Admitting: Family Medicine

## 2018-12-04 VITALS — BP 181/80 | HR 76 | Temp 98.7°F | Wt 191.8 lb

## 2018-12-04 DIAGNOSIS — R21 Rash and other nonspecific skin eruption: Secondary | ICD-10-CM

## 2018-12-04 DIAGNOSIS — I10 Essential (primary) hypertension: Secondary | ICD-10-CM

## 2018-12-04 MED ORDER — AMLODIPINE BESYLATE 5 MG PO TABS: 5.0000 mg | ORAL_TABLET | Freq: Every day | ORAL | 1 refills | Status: DC

## 2018-12-04 MED ORDER — PREDNISONE 20 MG PO TABS: 40.0000 mg | ORAL_TABLET | Freq: Every day | ORAL | 0 refills | Status: DC

## 2018-12-04 NOTE — Patient Instructions (Addendum)
Rash on arms and trunk could be contact dermatitis possibly from poison ivy or other substance.  Face rash likely reaction to coloring.  Prednisone for 5 days, but can also use over-the-counter hydrocortisone cream twice per day to all affected areas.  Recheck in the next 2 weeks, sooner if any spread or worsening rash  Blood pressure has been elevated to a level of hypertension.  Supination below.  Start amlodipine once per day and recheck in 2 weeks.   Return to the clinic or go to the nearest emergency room if any of your symptoms worsen or new symptoms occur.  Hypertension, Adult High blood pressure (hypertension) is when the force of blood pumping through the arteries is too strong. The arteries are the blood vessels that carry blood from the heart throughout the body. Hypertension forces the heart to work harder to pump blood and may cause arteries to become narrow or stiff. Untreated or uncontrolled hypertension can cause a heart attack, heart failure, a stroke, kidney disease, and other problems. A blood pressure reading consists of a higher number over a lower number. Ideally, your blood pressure should be below 120/80. The first ("top") number is called the systolic pressure. It is a measure of the pressure in your arteries as your heart beats. The second ("bottom") number is called the diastolic pressure. It is a measure of the pressure in your arteries as the heart relaxes. What are the causes? The exact cause of this condition is not known. There are some conditions that result in or are related to high blood pressure. What increases the risk? Some risk factors for high blood pressure are under your control. The following factors may make you more likely to develop this condition:  Smoking.  Having type 2 diabetes mellitus, high cholesterol, or both.  Not getting enough exercise or physical activity.  Being overweight.  Having too much fat, sugar, calories, or salt (sodium) in  your diet.  Drinking too much alcohol. Some risk factors for high blood pressure may be difficult or impossible to change. Some of these factors include:  Having chronic kidney disease.  Having a family history of high blood pressure.  Age. Risk increases with age.  Race. You may be at higher risk if you are African American.  Gender. Men are at higher risk than women before age 63. After age 41, women are at higher risk than men.  Having obstructive sleep apnea.  Stress. What are the signs or symptoms? High blood pressure may not cause symptoms. Very high blood pressure (hypertensive crisis) may cause:  Headache.  Anxiety.  Shortness of breath.  Nosebleed.  Nausea and vomiting.  Vision changes.  Severe chest pain.  Seizures. How is this diagnosed? This condition is diagnosed by measuring your blood pressure while you are seated, with your arm resting on a flat surface, your legs uncrossed, and your feet flat on the floor. The cuff of the blood pressure monitor will be placed directly against the skin of your upper arm at the level of your heart. It should be measured at least twice using the same arm. Certain conditions can cause a difference in blood pressure between your right and left arms. Certain factors can cause blood pressure readings to be lower or higher than normal for a short period of time:  When your blood pressure is higher when you are in a health care provider's office than when you are at home, this is called white coat hypertension. Most people  with this condition do not need medicines.  When your blood pressure is higher at home than when you are in a health care provider's office, this is called masked hypertension. Most people with this condition may need medicines to control blood pressure. If you have a high blood pressure reading during one visit or you have normal blood pressure with other risk factors, you may be asked to:  Return on a  different day to have your blood pressure checked again.  Monitor your blood pressure at home for 1 week or longer. If you are diagnosed with hypertension, you may have other blood or imaging tests to help your health care provider understand your overall risk for other conditions. How is this treated? This condition is treated by making healthy lifestyle changes, such as eating healthy foods, exercising more, and reducing your alcohol intake. Your health care provider may prescribe medicine if lifestyle changes are not enough to get your blood pressure under control, and if:  Your systolic blood pressure is above 130.  Your diastolic blood pressure is above 80. Your personal target blood pressure may vary depending on your medical conditions, your age, and other factors. Follow these instructions at home: Eating and drinking   Eat a diet that is high in fiber and potassium, and low in sodium, added sugar, and fat. An example eating plan is called the DASH (Dietary Approaches to Stop Hypertension) diet. To eat this way: ? Eat plenty of fresh fruits and vegetables. Try to fill one half of your plate at each meal with fruits and vegetables. ? Eat whole grains, such as whole-wheat pasta, brown rice, or whole-grain bread. Fill about one fourth of your plate with whole grains. ? Eat or drink low-fat dairy products, such as skim milk or low-fat yogurt. ? Avoid fatty cuts of meat, processed or cured meats, and poultry with skin. Fill about one fourth of your plate with lean proteins, such as fish, chicken without skin, beans, eggs, or tofu. ? Avoid pre-made and processed foods. These tend to be higher in sodium, added sugar, and fat.  Reduce your daily sodium intake. Most people with hypertension should eat less than 1,500 mg of sodium a day.  Do not drink alcohol if: ? Your health care provider tells you not to drink. ? You are pregnant, may be pregnant, or are planning to become pregnant.  If  you drink alcohol: ? Limit how much you use to:  0-1 drink a day for women.  0-2 drinks a day for men. ? Be aware of how much alcohol is in your drink. In the [place] one drink equals one 12 oz bottle of beer (355 mL), one 5 oz glass of wine (148 mL), or one 1 oz glass of hard liquor (44 mL). Lifestyle   Work with your health care provider to maintain a healthy body weight or to lose weight. Ask what an ideal weight is for you.  Get at least 30 minutes of exercise most days of the week. Activities may include walking, swimming, or biking.  Include exercise to strengthen your muscles (resistance exercise), such as Pilates or lifting weights, as part of your weekly exercise routine. Try to do these types of exercises for 30 minutes at least 3 days a week.  Do not use any products that contain nicotine or tobacco, such as cigarettes, e-cigarettes, and chewing tobacco. If you need help quitting, ask your health care provider.  Monitor your blood pressure at home as told  by your health care provider.  Keep all follow-up visits as told by your health care provider. This is important. Medicines  Take over-the-counter and prescription medicines only as told by your health care provider. Follow directions carefully. Blood pressure medicines must be taken as prescribed.  Do not skip doses of blood pressure medicine. Doing this puts you at risk for problems and can make the medicine less effective.  Ask your health care provider about side effects or reactions to medicines that you should watch for. Contact a health care provider if you:  Think you are having a reaction to a medicine you are taking.  Have headaches that keep coming back (recurring).  Feel dizzy.  Have swelling in your ankles.  Have trouble with your vision. Get help right away if you:  Develop a severe headache or confusion.  Have unusual weakness or numbness.  Feel faint.  Have severe pain in your chest or  abdomen.  Vomit repeatedly.  Have trouble breathing. Summary  Hypertension is when the force of blood pumping through your arteries is too strong. If this condition is not controlled, it may put you at risk for serious complications.  Your personal target blood pressure may vary depending on your medical conditions, your age, and other factors. For most people, a normal blood pressure is less than 120/80.  Hypertension is treated with lifestyle changes, medicines, or a combination of both. Lifestyle changes include losing weight, eating a healthy, low-sodium diet, exercising more, and limiting alcohol. This information is not intended to replace advice given to you by your health care provider. Make sure you discuss any questions you have with your health care provider. Document Released: 01/18/2005 Document Revised: 09/28/2017 Document Reviewed: 09/28/2017 Elsevier Patient Education  [place].    Rash, Adult A rash is a change in the color of your skin. A rash can also change the way your skin feels. There are many different conditions and factors that can cause a rash. Some rashes may disappear after a few days, but some may last for a few weeks. Common causes of rashes include:  Viral infections, such as: ? Colds. ? Measles. ? Hand, foot, and mouth disease.  Bacterial infections, such as: ? Scarlet fever. ? Impetigo.  Fungal infections, such as Candida.  Allergic reactions to food, medicines, or skin care products. Follow these instructions at home: The goal of treatment is to stop the itching and keep the rash from spreading. Pay attention to any changes in your symptoms. Follow these instructions to help with your condition: Medicine Take or apply over-the-counter and prescription medicines only as told by your health care provider. These may include:  Corticosteroid creams to treat red or swollen skin.  Anti-itch lotions.  Oral allergy medicines  (antihistamines).  Oral corticosteroids for severe symptoms.  Skin care  Apply cool compresses to the affected areas.  Do not scratch or rub your skin.  Avoid covering the rash. Make sure the rash is exposed to air as much as possible. Managing itching and discomfort  Avoid hot showers or baths, which can make itching worse. A cold shower may help.  Try taking a bath with: ? Epsom salts. Follow manufacturer instructions on the packaging. You can get these at your local pharmacy or grocery store. ? Baking soda. Pour a small amount into the bath as told by your health care provider. ? Colloidal oatmeal. Follow manufacturer instructions on the packaging. You can get this at your local pharmacy or grocery  store.  Try applying baking soda paste to your skin. Stir water into baking soda until it reaches a paste-like consistency.  Try applying calamine lotion. This is an over-the-counter lotion that helps to relieve itchiness.  Keep cool and out of the sun. Sweating and being hot can make itching worse. General instructions   Rest as needed.  Drink enough fluid to keep your urine pale yellow.  Wear loose-fitting clothing.  Avoid scented soaps, detergents, and perfumes. Use gentle soaps, detergents, perfumes, and other cosmetic products.  Avoid any substance that causes your rash. Keep a journal to help track what causes your rash. Write down: ? What you eat. ? What cosmetic products you use. ? What you drink. ? What you wear. This includes jewelry.  Keep all follow-up visits as told by your health care provider. This is important. Contact a health care provider if:  You sweat at night.  You lose weight.  You urinate more than normal.  You urinate less than normal, or you notice that your urine is a darker color than usual.  You feel weak.  You vomit.  Your skin or the whites of your eyes look yellow (jaundice).  Your skin: ? Tingles. ? Is numb.  Your  rash: ? Does not go away after several days. ? Gets worse.  You are: ? Unusually thirsty. ? More tired than normal.  You have: ? New symptoms. ? Pain in your abdomen. ? A fever. ? Diarrhea. Get help right away if you:  Have a fever and your symptoms suddenly get worse.  Develop confusion.  Have a severe headache or a stiff neck.  Have severe joint pains or stiffness.  Have a seizure.  Develop a rash that covers all or most of your body. The rash may or may not be painful.  Develop blisters that: ? Are on top of the rash. ? Grow larger or grow together. ? Are painful. ? Are inside your nose or mouth.  Develop a rash that: ? Looks like purple pinprick-sized spots all over your body. ? Has a "bull's eye" or looks like a target. ? Is not related to sun exposure, is red and painful, and causes your skin to peel. Summary  A rash is a change in the color of your skin. Some rashes disappear after a few days, but some may last for a few weeks.  The goal of treatment is to stop the itching and keep the rash from spreading.  Take or apply over-the-counter and prescription medicines only as told by your health care provider.  Contact a health care provider if you have new or worsening symptoms.  Keep all follow-up visits as told by your health care provider. This is important. This information is not intended to replace advice given to you by your health care provider. Make sure you discuss any questions you have with your health care provider. Document Released: 01/08/2002 Document Revised: 05/12/2018 Document Reviewed: 08/22/2017 Elsevier Patient Education  2020 [place] Dermatitis Dermatitis is redness, soreness, and swelling (inflammation) of the skin. Contact dermatitis is a reaction to certain substances that touch the skin. Many different substances can cause contact dermatitis. There are two types of contact dermatitis:  Irritant contact dermatitis.  This type is caused by something that irritates your skin, such as having dry hands from washing them too often with soap. This type does not require previous exposure to the substance for a reaction to occur. This is the most common  type.  Allergic contact dermatitis. This type is caused by a substance that you are allergic to, such as poison ivy. This type occurs when you have been exposed to the substance (allergen) and develop a sensitivity to it. Dermatitis may develop soon after your first exposure to the allergen, or it may not develop until the next time you are exposed and every time thereafter. What are the causes? Irritant contact dermatitis is most commonly caused by exposure to:  Makeup.  Soaps.  Detergents.  Bleaches.  Acids.  Metal salts, such as nickel. Allergic contact dermatitis is most commonly caused by exposure to:  Poisonous plants.  Chemicals.  Jewelry.  Latex.  Medicines.  Preservatives in products, such as clothing. What increases the risk? You are more likely to develop this condition if you have:  A job that exposes you to irritants or allergens.  Certain medical conditions, such as asthma or eczema. What are the signs or symptoms? Symptoms of this condition may occur on your body anywhere the irritant has touched you or is touched by you.  Symptoms include: ? Dryness or flaking. ? Redness. ? Cracks. ? Itching. ? Pain or a burning feeling. ? Blisters. ? Drainage of small amounts of blood or clear fluid from skin cracks. With allergic contact dermatitis, there may also be swelling in areas such as the eyelids, mouth, or genitals. How is this diagnosed? This condition is diagnosed with a medical history and physical exam.  A patch skin test may be performed to help determine the cause.  If the condition is related to your job, you may need to see an occupational medicine specialist. How is this treated? This condition is treated by  checking for the cause of the reaction and protecting your skin from further contact. Treatment may also include:  Steroid creams or ointments. Oral steroid medicines may be needed in more severe cases.  Antibiotic medicines or antibacterial ointments, if a skin infection is present.  Antihistamine lotion or an antihistamine taken by mouth to ease itching.  A bandage (dressing). Follow these instructions at home: Skin care  Moisturize your skin as needed.  Apply cool compresses to the affected areas.  Try applying baking soda paste to your skin. Stir water into baking soda until it reaches a paste-like consistency.  Do not scratch your skin, and avoid friction to the affected area.  Avoid the use of soaps, perfumes, and dyes. Medicines  Take or apply over-the-counter and prescription medicines only as told by your health care provider.  If you were prescribed an antibiotic medicine, take or apply the antibiotic as told by your health care provider. Do not stop using the antibiotic even if your condition improves. Bathing  Try taking a bath with: ? Epsom salts. Follow the instructions on the packaging. You can get these at your local pharmacy or grocery store. ? Baking soda. Pour a small amount into the bath as directed by your health care provider. ? Colloidal oatmeal. Follow the instructions on the packaging. You can get this at your local pharmacy or grocery store.  Bathe less frequently, such as every other day.  Bathe in lukewarm water. Avoid using hot water. Bandage care  If you were given a bandage (dressing), change it as told by your health care provider.  Wash your hands with soap and water before and after you change your dressing. If soap and water are not available, use hand sanitizer. General instructions  Avoid the substance that caused your reaction.  If you do not know what caused it, keep a journal to try to track what caused it. Write down: ? What you  eat. ? What cosmetic products you use. ? What you drink. ? What you wear in the affected area. This includes jewelry.  More redness, swelling, or pain.  More fluid or blood.  Warmth.  Pus or a bad smell.  Keep all follow-up visits as told by your health care provider. This is important. Contact a health care provider if:  Your condition does not improve with treatment.  Your condition gets worse.  You have signs of infection such as swelling, tenderness, redness, soreness, or warmth in the affected area.  You have a fever.  You have new symptoms. Get help right away if:  You have a severe headache, neck pain, or neck stiffness.  You vomit.  You feel very sleepy.  You notice red streaks coming from the affected area.  Your bone or joint underneath the affected area becomes painful after the skin has healed.  The affected area turns darker.  You have difficulty breathing. Summary  Dermatitis is redness, soreness, and swelling (inflammation) of the skin. Contact dermatitis is a reaction to certain substances that touch the skin.  Symptoms of this condition may occur on your body anywhere the irritant has touched you or is touched by you.  This condition is treated by figuring out what caused the reaction and protecting your skin from further contact. Treatment may also include medicines and skin care.  Avoid the substance that caused your reaction. If you do not know what caused it, keep a journal to try to track what caused it.  Contact a health care provider if your condition gets worse or you have signs of infection such as swelling, tenderness, redness, soreness, or warmth in the affected area. This information is not intended to replace advice given to you by your health care provider. Make sure you discuss any questions you have with your health care provider. Document Released: 01/16/2000 Document Revised: 05/10/2018 Document Reviewed: 08/03/2017 Elsevier  Patient Education  [employer].   If you have lab work done today you will be contacted with your lab results within the next 2 weeks.  If you have not heard from [place] then please contact us. The fastest way to get your results is to register for My Chart.   IF you received an x-ray today, you will receive an invoice from [facility]. Please contact [facility] at [phone] with questions or concerns regarding your invoice.   IF you received labwork today, you will receive an invoice from [facility]. Please contact [facility] at [phone] with questions or concerns regarding your invoice.   Our billing staff will not be able to assist you with questions regarding bills from these companies.  You will be contacted with the lab results as soon as they are available. The fastest way to get your results is to activate your My Chart account. Instructions are located on the last page of this paperwork. If you have not heard from [place] regarding the results in 2 weeks, please contact this office.

## 2018-12-04 NOTE — Progress Notes (Signed)
Subjective:    Patient ID: [name], male    DOB: [birth year], 68 y.o.   MRN: [id number]  HPI  [name] is a 68 y.o. male Presents today for: Chief Complaint  Patient presents with  . Rash    rash on both elbow, mouth and butt area for 1 month   History of HIV disease, followed by infectious disease, Dr. Imogene Burn.  Appointment September 21.  Continued on Genvoya.  Previously treated for syphilis with 3 doses of IM benzathine penicillin in February..  Diminished titer in September, plan to recheck in January.  Most recent titer 1:8, previously 1:32 in 2/28. Rash resolved.   Rash:  R elbow/forearm initially 1 month ago - spread to left forearm, face, R buttock, R flank. No gential or inside mouth involvement,  Itches. Sometimes flaky surface. ? Poison ivy in yard - works in yard, but no known poison ivy.  Tx: calamine, caladryl, neosporin, antifungal cream, apple cider vinegar, cerna lotion for psoriasis, hydrocortisone (ut not consistently). No known hx of psoriasis.  No regular dermatologist.    Burning on face after using [employer] hair color to beard.    Hypertension: BP Readings from Last 3 Encounters:  12/04/18 (!) 181/80  10/23/18 (!) 161/97  04/19/18 (!) 157/86   Lab Results  Component Value Date   CREATININE 1.21 10/11/2018  I am listed as his primary care provider, but have not seen him recently before today.  He is not on antihypertensives. Red wine 2 nights per week - 4-5 per week.  No IDU.  Still contemplating quitting smoking.     Patient Active Problem List   Diagnosis Date Noted  . Syphilis 04/19/2018  . Secondary polycythemia 05/26/2016  . Pruritic rash 04/21/2015  . Erectile dysfunction 03/02/2012  . Allergic conjunctivitis 03/02/2012  . HTN (hypertension) 10/06/2010  . Smoking 10/06/2010  . BPH (benign prostatic hyperplasia) 10/06/2010  . COLONIC POLYPS 10/17/2008  . CYSTS OF ORAL SOFT TISSUES 10/17/2008  . DIVERTICULITIS OF COLON  10/17/2008  . Human immunodeficiency virus (HIV) disease ([place]) 06/06/2008  . THRUSH 06/06/2008  . Pancytopenia ([place]) 06/06/2008  . LUNG NODULE 06/06/2008  . RENAL CALCULUS 06/06/2008   Past Medical History:  Diagnosis Date  . BPH (benign prostatic hyperplasia)   . Erectile dysfunction   . HIV infection ([place])   . Pruritic rash 04/21/2015  . Secondary polycythemia 05/26/2016  . Syphilis 04/19/2018   Past Surgical History:  Procedure Laterality Date  . PARTIAL COLECTOMY     No Known Allergies Prior to Admission medications   Medication Sig Start Date End Date Taking? Authorizing Provider  GENVOYA 150-150-200-10 MG TABS tablet TAKE 1 TABLET BY MOUTH EVERY DAY WITH BREAKFAST 10/24/18   Tommy Medal, Lavell Islam, MD   Social History   Socioeconomic History  . Marital status: Single    Spouse name: Not on file  . Number of children: Not on file  . Years of education: Not on file  . Highest education level: Not on file  Occupational History  . Not on file  Social Needs  . Financial resource strain: Not on file  . Food insecurity    Worry: Not on file    Inability: Not on file  . Transportation needs    Medical: Not on file    Non-medical: Not on file  Tobacco Use  . Smoking status: Current Every Day Smoker    Packs/day: 0.80    Types: Cigarettes  Start date: 08/01/2016  . Smokeless tobacco: Never Used  . Tobacco comment: 1/2 pack daily  Substance and Sexual Activity  . Alcohol use: Yes    Alcohol/week: 0.0 standard drinks    Comment: social  . Drug use: No  . Sexual activity: Never    Comment: given condoms  Lifestyle  . Physical activity    Days per week: Not on file    Minutes per session: Not on file  . Stress: Not on file  Relationships  . Social Herbalist on phone: Not on file    Gets together: Not on file    Attends religious service: Not on file    Active member of club or organization: Not on file    Attends meetings of clubs or organizations: Not  on file    Relationship status: Not on file  . Intimate partner violence    Fear of current or ex partner: Not on file    Emotionally abused: Not on file    Physically abused: Not on file    Forced sexual activity: Not on file  Other Topics Concern  . Not on file  Social History Narrative  . Not on file    Review of Systems  Constitutional: Negative for fatigue and unexpected weight change.  Eyes: Negative for visual disturbance.  Respiratory: Negative for cough, chest tightness and shortness of breath.   Cardiovascular: Negative for chest pain, palpitations and leg swelling.  Gastrointestinal: Negative for abdominal pain and blood in stool.  Skin: Positive for rash.  Neurological: Negative for dizziness, light-headedness and headaches.       Objective:   Physical Exam Vitals signs reviewed.  Constitutional:      Appearance: He is well-developed.  HENT:     Head: Normocephalic and atraumatic.  Eyes:     Pupils: Pupils are equal, round, and reactive to light.  Neck:     Vascular: No carotid bruit or JVD.  Cardiovascular:     Rate and Rhythm: Normal rate and regular rhythm.     Heart sounds: Normal heart sounds. No murmur.  Pulmonary:     Effort: Pulmonary effort is normal.     Breath sounds: Normal breath sounds. No rales.  Skin:    General: Skin is warm and dry.     Findings: Erythema present.          Comments: 3 cm x 5 cm excoriated patch on right extensor forearm, no surrounding erythema or vascular tracking, no discharge.  Few scattered similar appearing pustules on the left forearm, right upper buttock, right distal axilla..  Face with erythematous patch and beard area and above upper lip.  Mucosal membranes appear spared.  Neurological:     Mental Status: He is alert and oriented to person, place, and time.    Vitals:   12/04/18 1407 12/04/18 1409  BP: (!) 184/81 (!) 181/80  Pulse: 76   Temp: 98.7 F (37.1 C)   TempSrc: Oral   SpO2: 97%   Weight: 191  lb 12.8 oz (87 kg)           Assessment & Plan:    [name] is a 68 y.o. male Rash and nonspecific skin eruption - Plan: predniSONE (DELTASONE) 20 MG tablet Rash of face - Plan: predniSONE (DELTASONE) 20 MG tablet  -Possible contact dermatitis with previous chemical use of face, other areas contact dermatitis also likely, possible poison ivy versus other irritant.  Differential includes psoriasis with reported  flaking.  -Given face involvement, decided short course of prednisone may be helpful, potential risks and side effects were discussed.  Topical hydrocortisone from home to other areas.  RTC precautions if persistent, may need dermatology eval.  Sooner if worsening  Essential hypertension - Plan: amLODipine (NORVASC) 5 MG tablet  -Based on current level start meds.  Start amlodipine 5 mg daily initially, likely will need additional dosing/agent.  Recheck 2 weeks  Meds ordered this encounter  Medications  . amLODipine (NORVASC) 5 MG tablet    Sig: Take 1 tablet (5 mg total) by mouth daily.    Dispense:  30 tablet    Refill:  1  . predniSONE (DELTASONE) 20 MG tablet    Sig: Take 2 tablets (40 mg total) by mouth daily with breakfast.    Dispense:  10 tablet    Refill:  0   Patient Instructions     Rash on arms and trunk could be contact dermatitis possibly from poison ivy or other substance.  Face rash likely reaction to coloring.  Prednisone for 5 days, but can also use over-the-counter hydrocortisone cream twice per day to all affected areas.  Recheck in the next 2 weeks, sooner if any spread or worsening rash  Blood pressure has been elevated to a level of hypertension.  Supination below.  Start amlodipine once per day and recheck in 2 weeks.   Return to the clinic or go to the nearest emergency room if any of your symptoms worsen or new symptoms occur.  Hypertension, Adult High blood pressure (hypertension) is when the force of blood pumping through the arteries is  too strong. The arteries are the blood vessels that carry blood from the heart throughout the body. Hypertension forces the heart to work harder to pump blood and may cause arteries to become narrow or stiff. Untreated or uncontrolled hypertension can cause a heart attack, heart failure, a stroke, kidney disease, and other problems. A blood pressure reading consists of a higher number over a lower number. Ideally, your blood pressure should be below 120/80. The first ("top") number is called the systolic pressure. It is a measure of the pressure in your arteries as your heart beats. The second ("bottom") number is called the diastolic pressure. It is a measure of the pressure in your arteries as the heart relaxes. What are the causes? The exact cause of this condition is not known. There are some conditions that result in or are related to high blood pressure. What increases the risk? Some risk factors for high blood pressure are under your control. The following factors may make you more likely to develop this condition:  Smoking.  Having type 2 diabetes mellitus, high cholesterol, or both.  Not getting enough exercise or physical activity.  Being overweight.  Having too much fat, sugar, calories, or salt (sodium) in your diet.  Drinking too much alcohol. Some risk factors for high blood pressure may be difficult or impossible to change. Some of these factors include:  Having chronic kidney disease.  Having a family history of high blood pressure.  Age. Risk increases with age.  Race. You may be at higher risk if you are African American.  Gender. Men are at higher risk than women before age 55. After age 57, women are at higher risk than men.  Having obstructive sleep apnea.  Stress. What are the signs or symptoms? High blood pressure may not cause symptoms. Very high blood pressure (hypertensive crisis) may cause:  Headache.  Anxiety.  Shortness of breath.  Nosebleed.   Nausea and vomiting.  Vision changes.  Severe chest pain.  Seizures. How is this diagnosed? This condition is diagnosed by measuring your blood pressure while you are seated, with your arm resting on a flat surface, your legs uncrossed, and your feet flat on the floor. The cuff of the blood pressure monitor will be placed directly against the skin of your upper arm at the level of your heart. It should be measured at least twice using the same arm. Certain conditions can cause a difference in blood pressure between your right and left arms. Certain factors can cause blood pressure readings to be lower or higher than normal for a short period of time:  When your blood pressure is higher when you are in a health care provider's office than when you are at home, this is called white coat hypertension. Most people with this condition do not need medicines.  When your blood pressure is higher at home than when you are in a health care provider's office, this is called masked hypertension. Most people with this condition may need medicines to control blood pressure. If you have a high blood pressure reading during one visit or you have normal blood pressure with other risk factors, you may be asked to:  Return on a different day to have your blood pressure checked again.  Monitor your blood pressure at home for 1 week or longer. If you are diagnosed with hypertension, you may have other blood or imaging tests to help your health care provider understand your overall risk for other conditions. How is this treated? This condition is treated by making healthy lifestyle changes, such as eating healthy foods, exercising more, and reducing your alcohol intake. Your health care provider may prescribe medicine if lifestyle changes are not enough to get your blood pressure under control, and if:  Your systolic blood pressure is above 130.  Your diastolic blood pressure is above 80. Your personal target  blood pressure may vary depending on your medical conditions, your age, and other factors. Follow these instructions at home: Eating and drinking   Eat a diet that is high in fiber and potassium, and low in sodium, added sugar, and fat. An example eating plan is called the DASH (Dietary Approaches to Stop Hypertension) diet. To eat this way: ? Eat plenty of fresh fruits and vegetables. Try to fill one half of your plate at each meal with fruits and vegetables. ? Eat whole grains, such as whole-wheat pasta, brown rice, or whole-grain bread. Fill about one fourth of your plate with whole grains. ? Eat or drink low-fat dairy products, such as skim milk or low-fat yogurt. ? Avoid fatty cuts of meat, processed or cured meats, and poultry with skin. Fill about one fourth of your plate with lean proteins, such as fish, chicken without skin, beans, eggs, or tofu. ? Avoid pre-made and processed foods. These tend to be higher in sodium, added sugar, and fat.  Reduce your daily sodium intake. Most people with hypertension should eat less than 1,500 mg of sodium a day.  Do not drink alcohol if: ? Your health care provider tells you not to drink. ? You are pregnant, may be pregnant, or are planning to become pregnant.  If you drink alcohol: ? Limit how much you use to:  0-1 drink a day for women.  0-2 drinks a day for men. ? Be aware of how much alcohol is in your  drink. In the [place] one drink equals one 12 oz bottle of beer (355 mL), one 5 oz glass of wine (148 mL), or one 1 oz glass of hard liquor (44 mL). Lifestyle   Work with your health care provider to maintain a healthy body weight or to lose weight. Ask what an ideal weight is for you.  Get at least 30 minutes of exercise most days of the week. Activities may include walking, swimming, or biking.  Include exercise to strengthen your muscles (resistance exercise), such as Pilates or lifting weights, as part of your weekly exercise routine.  Try to do these types of exercises for 30 minutes at least 3 days a week.  Do not use any products that contain nicotine or tobacco, such as cigarettes, e-cigarettes, and chewing tobacco. If you need help quitting, ask your health care provider.  Monitor your blood pressure at home as told by your health care provider.  Keep all follow-up visits as told by your health care provider. This is important. Medicines  Take over-the-counter and prescription medicines only as told by your health care provider. Follow directions carefully. Blood pressure medicines must be taken as prescribed.  Do not skip doses of blood pressure medicine. Doing this puts you at risk for problems and can make the medicine less effective.  Ask your health care provider about side effects or reactions to medicines that you should watch for. Contact a health care provider if you:  Think you are having a reaction to a medicine you are taking.  Have headaches that keep coming back (recurring).  Feel dizzy.  Have swelling in your ankles.  Have trouble with your vision. Get help right away if you:  Develop a severe headache or confusion.  Have unusual weakness or numbness.  Feel faint.  Have severe pain in your chest or abdomen.  Vomit repeatedly.  Have trouble breathing. Summary  Hypertension is when the force of blood pumping through your arteries is too strong. If this condition is not controlled, it may put you at risk for serious complications.  Your personal target blood pressure may vary depending on your medical conditions, your age, and other factors. For most people, a normal blood pressure is less than 120/80.  Hypertension is treated with lifestyle changes, medicines, or a combination of both. Lifestyle changes include losing weight, eating a healthy, low-sodium diet, exercising more, and limiting alcohol. This information is not intended to replace advice given to you by your health care  provider. Make sure you discuss any questions you have with your health care provider. Document Released: 01/18/2005 Document Revised: 09/28/2017 Document Reviewed: 09/28/2017 Elsevier Patient Education  [place].    Rash, Adult A rash is a change in the color of your skin. A rash can also change the way your skin feels. There are many different conditions and factors that can cause a rash. Some rashes may disappear after a few days, but some may last for a few weeks. Common causes of rashes include:  Viral infections, such as: ? Colds. ? Measles. ? Hand, foot, and mouth disease.  Bacterial infections, such as: ? Scarlet fever. ? Impetigo.  Fungal infections, such as Candida.  Allergic reactions to food, medicines, or skin care products. Follow these instructions at home: The goal of treatment is to stop the itching and keep the rash from spreading. Pay attention to any changes in your symptoms. Follow these instructions to help with your condition: Medicine Take or apply over-the-counter  and prescription medicines only as told by your health care provider. These may include:  Corticosteroid creams to treat red or swollen skin.  Anti-itch lotions.  Oral allergy medicines (antihistamines).  Oral corticosteroids for severe symptoms.  Skin care  Apply cool compresses to the affected areas.  Do not scratch or rub your skin.  Avoid covering the rash. Make sure the rash is exposed to air as much as possible. Managing itching and discomfort  Avoid hot showers or baths, which can make itching worse. A cold shower may help.  Try taking a bath with: ? Epsom salts. Follow manufacturer instructions on the packaging. You can get these at your local pharmacy or grocery store. ? Baking soda. Pour a small amount into the bath as told by your health care provider. ? Colloidal oatmeal. Follow manufacturer instructions on the packaging. You can get this at your local pharmacy  or grocery store.  Try applying baking soda paste to your skin. Stir water into baking soda until it reaches a paste-like consistency.  Try applying calamine lotion. This is an over-the-counter lotion that helps to relieve itchiness.  Keep cool and out of the sun. Sweating and being hot can make itching worse. General instructions   Rest as needed.  Drink enough fluid to keep your urine pale yellow.  Wear loose-fitting clothing.  Avoid scented soaps, detergents, and perfumes. Use gentle soaps, detergents, perfumes, and other cosmetic products.  Avoid any substance that causes your rash. Keep a journal to help track what causes your rash. Write down: ? What you eat. ? What cosmetic products you use. ? What you drink. ? What you wear. This includes jewelry.  Keep all follow-up visits as told by your health care provider. This is important. Contact a health care provider if:  You sweat at night.  You lose weight.  You urinate more than normal.  You urinate less than normal, or you notice that your urine is a darker color than usual.  You feel weak.  You vomit.  Your skin or the whites of your eyes look yellow (jaundice).  Your skin: ? Tingles. ? Is numb.  Your rash: ? Does not go away after several days. ? Gets worse.  You are: ? Unusually thirsty. ? More tired than normal.  You have: ? New symptoms. ? Pain in your abdomen. ? A fever. ? Diarrhea. Get help right away if you:  Have a fever and your symptoms suddenly get worse.  Develop confusion.  Have a severe headache or a stiff neck.  Have severe joint pains or stiffness.  Have a seizure.  Develop a rash that covers all or most of your body. The rash may or may not be painful.  Develop blisters that: ? Are on top of the rash. ? Grow larger or grow together. ? Are painful. ? Are inside your nose or mouth.  Develop a rash that: ? Looks like purple pinprick-sized spots all over your body. ?  Has a "bull's eye" or looks like a target. ? Is not related to sun exposure, is red and painful, and causes your skin to peel. Summary  A rash is a change in the color of your skin. Some rashes disappear after a few days, but some may last for a few weeks.  The goal of treatment is to stop the itching and keep the rash from spreading.  Take or apply over-the-counter and prescription medicines only as told by your health care provider.  Contact a  health care provider if you have new or worsening symptoms.  Keep all follow-up visits as told by your health care provider. This is important. This information is not intended to replace advice given to you by your health care provider. Make sure you discuss any questions you have with your health care provider. Document Released: 01/08/2002 Document Revised: 05/12/2018 Document Reviewed: 08/22/2017 Elsevier Patient Education  2020 [place] Dermatitis Dermatitis is redness, soreness, and swelling (inflammation) of the skin. Contact dermatitis is a reaction to certain substances that touch the skin. Many different substances can cause contact dermatitis. There are two types of contact dermatitis:  Irritant contact dermatitis. This type is caused by something that irritates your skin, such as having dry hands from washing them too often with soap. This type does not require previous exposure to the substance for a reaction to occur. This is the most common type.  Allergic contact dermatitis. This type is caused by a substance that you are allergic to, such as poison ivy. This type occurs when you have been exposed to the substance (allergen) and develop a sensitivity to it. Dermatitis may develop soon after your first exposure to the allergen, or it may not develop until the next time you are exposed and every time thereafter. What are the causes? Irritant contact dermatitis is most commonly caused by exposure to:  Makeup.  Soaps.   Detergents.  Bleaches.  Acids.  Metal salts, such as nickel. Allergic contact dermatitis is most commonly caused by exposure to:  Poisonous plants.  Chemicals.  Jewelry.  Latex.  Medicines.  Preservatives in products, such as clothing. What increases the risk? You are more likely to develop this condition if you have:  A job that exposes you to irritants or allergens.  Certain medical conditions, such as asthma or eczema. What are the signs or symptoms? Symptoms of this condition may occur on your body anywhere the irritant has touched you or is touched by you.  Symptoms include: ? Dryness or flaking. ? Redness. ? Cracks. ? Itching. ? Pain or a burning feeling. ? Blisters. ? Drainage of small amounts of blood or clear fluid from skin cracks. With allergic contact dermatitis, there may also be swelling in areas such as the eyelids, mouth, or genitals. How is this diagnosed? This condition is diagnosed with a medical history and physical exam.  A patch skin test may be performed to help determine the cause.  If the condition is related to your job, you may need to see an occupational medicine specialist. How is this treated? This condition is treated by checking for the cause of the reaction and protecting your skin from further contact. Treatment may also include:  Steroid creams or ointments. Oral steroid medicines may be needed in more severe cases.  Antibiotic medicines or antibacterial ointments, if a skin infection is present.  Antihistamine lotion or an antihistamine taken by mouth to ease itching.  A bandage (dressing). Follow these instructions at home: Skin care  Moisturize your skin as needed.  Apply cool compresses to the affected areas.  Try applying baking soda paste to your skin. Stir water into baking soda until it reaches a paste-like consistency.  Do not scratch your skin, and avoid friction to the affected area.  Avoid the use of  soaps, perfumes, and dyes. Medicines  Take or apply over-the-counter and prescription medicines only as told by your health care provider.  If you were prescribed an antibiotic medicine, take or apply  the antibiotic as told by your health care provider. Do not stop using the antibiotic even if your condition improves. Bathing  Try taking a bath with: ? Epsom salts. Follow the instructions on the packaging. You can get these at your local pharmacy or grocery store. ? Baking soda. Pour a small amount into the bath as directed by your health care provider. ? Colloidal oatmeal. Follow the instructions on the packaging. You can get this at your local pharmacy or grocery store.  Bathe less frequently, such as every other day.  Bathe in lukewarm water. Avoid using hot water. Bandage care  If you were given a bandage (dressing), change it as told by your health care provider.  Wash your hands with soap and water before and after you change your dressing. If soap and water are not available, use hand sanitizer. General instructions  Avoid the substance that caused your reaction. If you do not know what caused it, keep a journal to try to track what caused it. Write down: ? What you eat. ? What cosmetic products you use. ? What you drink. ? What you wear in the affected area. This includes jewelry.  More redness, swelling, or pain.  More fluid or blood.  Warmth.  Pus or a bad smell.  Keep all follow-up visits as told by your health care provider. This is important. Contact a health care provider if:  Your condition does not improve with treatment.  Your condition gets worse.  You have signs of infection such as swelling, tenderness, redness, soreness, or warmth in the affected area.  You have a fever.  You have new symptoms. Get help right away if:  You have a severe headache, neck pain, or neck stiffness.  You vomit.  You feel very sleepy.  You notice red streaks  coming from the affected area.  Your bone or joint underneath the affected area becomes painful after the skin has healed.  The affected area turns darker.  You have difficulty breathing. Summary  Dermatitis is redness, soreness, and swelling (inflammation) of the skin. Contact dermatitis is a reaction to certain substances that touch the skin.  Symptoms of this condition may occur on your body anywhere the irritant has touched you or is touched by you.  This condition is treated by figuring out what caused the reaction and protecting your skin from further contact. Treatment may also include medicines and skin care.  Avoid the substance that caused your reaction. If you do not know what caused it, keep a journal to try to track what caused it.  Contact a health care provider if your condition gets worse or you have signs of infection such as swelling, tenderness, redness, soreness, or warmth in the affected area. This information is not intended to replace advice given to you by your health care provider. Make sure you discuss any questions you have with your health care provider. Document Released: 01/16/2000 Document Revised: 05/10/2018 Document Reviewed: 08/03/2017 Elsevier Patient Education  [employer].   If you have lab work done today you will be contacted with your lab results within the next 2 weeks.  If you have not heard from [place] then please contact us. The fastest way to get your results is to register for My Chart.   IF you received an x-ray today, you will receive an invoice from [facility]. Please contact [facility] at [phone] with questions or concerns regarding your invoice.   IF you received labwork today, you  will receive an invoice from [place]. Please contact [facility] at [phone] with questions or concerns regarding your invoice.   Our billing staff will not be able to assist you with questions regarding bills from these  companies.  You will be contacted with the lab results as soon as they are available. The fastest way to get your results is to activate your My Chart account. Instructions are located on the last page of this paperwork. If you have not heard from [place] regarding the results in 2 weeks, please contact this office.       Signed,   Merri Ray, MD [facility] [place].  12/05/18 9:37 AM

## 2018-12-05 ENCOUNTER — Encounter: Payer: Self-pay | Admitting: Family Medicine

## 2018-12-15 ENCOUNTER — Telehealth: Payer: Self-pay

## 2018-12-15 NOTE — Telephone Encounter (Signed)
Patient called office today stating he needs a letter giving him clearance for oral surgery. Patient is being seen at [facility], and will need letter before patient can have this done. Will forward message to MD to advise on letter.  [address]: [phone] Dr. Orlena Sheldon, [place]

## 2018-12-18 ENCOUNTER — Encounter: Payer: Self-pay | Admitting: Infectious Disease

## 2018-12-18 NOTE — Telephone Encounter (Signed)
I just wrote letter for him. Just need to find where it printed and I can sign it

## 2018-12-21 ENCOUNTER — Other Ambulatory Visit: Payer: Self-pay

## 2018-12-21 ENCOUNTER — Encounter: Payer: Self-pay | Admitting: Family Medicine

## 2018-12-21 ENCOUNTER — Ambulatory Visit (INDEPENDENT_AMBULATORY_CARE_PROVIDER_SITE_OTHER): Payer: Medicare Other | Admitting: Family Medicine

## 2018-12-21 VITALS — BP 145/82 | HR 75 | Temp 98.9°F | Wt 188.6 lb

## 2018-12-21 DIAGNOSIS — R21 Rash and other nonspecific skin eruption: Secondary | ICD-10-CM | POA: Diagnosis not present

## 2018-12-21 DIAGNOSIS — I10 Essential (primary) hypertension: Secondary | ICD-10-CM | POA: Diagnosis not present

## 2018-12-21 NOTE — Patient Instructions (Addendum)
Same dose amlodipine. See info on controlling blood pressure. If readings at home remain over 140/90 in next few weeks, let me know.  Recheck in 2 months.   Rash looks better.  Avoid hydrogen peroxide for now.  Cetaphil face cleanser may be milder and less likely to cause redness.  Eucerin, Aveeno, or Cetaphil lotion for other dry areas.  Okay to continue hydrocortisone for elbow or other areas if needed, but if not continuing to resolve in the next 4 to 6 weeks or any worsening sooner, would recommend evaluation with dermatology.  Return to the clinic or go to the nearest emergency room if any of your symptoms worsen or new symptoms occur.     Managing Your Hypertension Hypertension is commonly called high blood pressure. This is when the force of your blood pressing against the walls of your arteries is too strong. Arteries are blood vessels that carry blood from your heart throughout your body. Hypertension forces the heart to work harder to pump blood, and may cause the arteries to become narrow or stiff. Having untreated or uncontrolled hypertension can cause heart attack, stroke, kidney disease, and other problems. What are blood pressure readings? A blood pressure reading consists of a higher number over a lower number. Ideally, your blood pressure should be below 120/80. The first ("top") number is called the systolic pressure. It is a measure of the pressure in your arteries as your heart beats. The second ("bottom") number is called the diastolic pressure. It is a measure of the pressure in your arteries as the heart relaxes. What does my blood pressure reading mean? Blood pressure is classified into four stages. Based on your blood pressure reading, your health care provider may use the following stages to determine what type of treatment you need, if any. Systolic pressure and diastolic pressure are measured in a unit called mm Hg. Normal  Systolic pressure: below 123456.  Diastolic  pressure: below 80. Elevated  Systolic pressure: [id number].  Diastolic pressure: below 80. Hypertension stage 1  Systolic pressure: 0000000.  Diastolic pressure: [id number]. Hypertension stage 2  Systolic pressure: [id number] or above.  Diastolic pressure: 90 or above. What health risks are associated with hypertension? Managing your hypertension is an important responsibility. Uncontrolled hypertension can lead to:  A heart attack.  A stroke.  A weakened blood vessel (aneurysm).  Heart failure.  Kidney damage.  Eye damage.  Metabolic syndrome.  Memory and concentration problems. What changes can I make to manage my hypertension? Hypertension can be managed by making lifestyle changes and possibly by taking medicines. Your health care provider will help you make a plan to bring your blood pressure within a normal range. Eating and drinking   Eat a diet that is high in fiber and potassium, and low in salt (sodium), added sugar, and fat. An example eating plan is called the DASH (Dietary Approaches to Stop Hypertension) diet. To eat this way: ? Eat plenty of fresh fruits and vegetables. Try to fill half of your plate at each meal with fruits and vegetables. ? Eat whole grains, such as whole wheat pasta, brown rice, or whole grain bread. Fill about one quarter of your plate with whole grains. ? Eat low-fat diary products. ? Avoid fatty cuts of meat, processed or cured meats, and poultry with skin. Fill about one quarter of your plate with lean proteins such as fish, chicken without skin, beans, eggs, and tofu. ? Avoid premade and processed foods. These tend to be higher in  sodium, added sugar, and fat.  Reduce your daily sodium intake. Most people with hypertension should eat less than 1,500 mg of sodium a day.  Limit alcohol intake to no more than 1 drink a day for nonpregnant women and 2 drinks a day for men. One drink equals 12 oz of beer, 5 oz of wine, or 1 oz of hard  liquor. Lifestyle  Work with your health care provider to maintain a healthy body weight, or to lose weight. Ask what an ideal weight is for you.  Get at least 30 minutes of exercise that causes your heart to beat faster (aerobic exercise) most days of the week. Activities may include walking, swimming, or biking.  Include exercise to strengthen your muscles (resistance exercise), such as weight lifting, as part of your weekly exercise routine. Try to do these types of exercises for 30 minutes at least 3 days a week.  Do not use any products that contain nicotine or tobacco, such as cigarettes and e-cigarettes. If you need help quitting, ask your health care provider.  Control any long-term (chronic) conditions you have, such as high cholesterol or diabetes. Monitoring  Monitor your blood pressure at home as told by your health care provider. Your personal target blood pressure may vary depending on your medical conditions, your age, and other factors.  Have your blood pressure checked regularly, as often as told by your health care provider. Working with your health care provider  Review all the medicines you take with your health care provider because there may be side effects or interactions.  Talk with your health care provider about your diet, exercise habits, and other lifestyle factors that may be contributing to hypertension.  Visit your health care provider regularly. Your health care provider can help you create and adjust your plan for managing hypertension. Will I need medicine to control my blood pressure? Your health care provider may prescribe medicine if lifestyle changes are not enough to get your blood pressure under control, and if:  Your systolic blood pressure is 130 or higher.  Your diastolic blood pressure is 80 or higher. Take medicines only as told by your health care provider. Follow the directions carefully. Blood pressure medicines must be taken as  prescribed. The medicine does not work as well when you skip doses. Skipping doses also puts you at risk for problems. Contact a health care provider if:  You think you are having a reaction to medicines you have taken.  You have repeated (recurrent) headaches.  You feel dizzy.  You have swelling in your ankles.  You have trouble with your vision. Get help right away if:  You develop a severe headache or confusion.  You have unusual weakness or numbness, or you feel faint.  You have severe pain in your chest or abdomen.  You vomit repeatedly.  You have trouble breathing. Summary  Hypertension is when the force of blood pumping through your arteries is too strong. If this condition is not controlled, it may put you at risk for serious complications.  Your personal target blood pressure may vary depending on your medical conditions, your age, and other factors. For most people, a normal blood pressure is less than 120/80.  Hypertension is managed by lifestyle changes, medicines, or both. Lifestyle changes include weight loss, eating a healthy, low-sodium diet, exercising more, and limiting alcohol. This information is not intended to replace advice given to you by your health care provider. Make sure you discuss any questions you  have with your health care provider. Document Released: 10/13/2011 Document Revised: 05/12/2018 Document Reviewed: 12/17/2015 Elsevier Patient Education  [employer].    If you have lab work done today you will be contacted with your lab results within the next 2 weeks.  If you have not heard from [place] then please contact us. The fastest way to get your results is to register for My Chart.   IF you received an x-ray today, you will receive an invoice from [facility]. Please contact [facility] at [phone] with questions or concerns regarding your invoice.   IF you received labwork today, you will receive an invoice from  [place]. Please contact [facility] at [phone] with questions or concerns regarding your invoice.   Our billing staff will not be able to assist you with questions regarding bills from these companies.  You will be contacted with the lab results as soon as they are available. The fastest way to get your results is to activate your My Chart account. Instructions are located on the last page of this paperwork. If you have not heard from [place] regarding the results in 2 weeks, please contact this office.

## 2018-12-21 NOTE — Progress Notes (Signed)
Subjective:  Patient ID: [name], male    DOB: 07-11-1950  Age: 68 y.o. MRN: [id number]  CC:  Chief Complaint  Patient presents with  . Hypertension    follow up. Patient was put on amlodipine at last visit.  . Rash    f/u rash on both elbow,  finished prednisone rash is better    HPI [name] presents for   Hypertension: Follow-up from November 2 visit.  Started on amlodipine 5 mg daily initially.  Home readings:none.  No new side effects, taking meds daily.  Rare added salt.  BP Readings from Last 3 Encounters:  12/21/18 (!) 145/82  12/04/18 (!) 181/80  10/23/18 (!) 161/97   Lab Results  Component Value Date   CREATININE 1.21 10/11/2018    Elbow rash: Rash with multiple areas discussed at November 2 visit, possible contact dermatitis otitis with previous chemical use of face, other areas also possible contact dermatitis poison ivy, or other irritant.  Psoriasis also in differential with reported flaking.  Treated with prednisone 40 mg daily x5 days, topical hydrocortisone from home to other areas.  Rash improved, no itching now, faded but some still there. Hydrocortisone 3 times per day. Face rash almost gone. Has also been using hydrogen peroxide.   History Patient Active Problem List   Diagnosis Date Noted  . Syphilis 04/19/2018  . Secondary polycythemia 05/26/2016  . Pruritic rash 04/21/2015  . Erectile dysfunction 03/02/2012  . Allergic conjunctivitis 03/02/2012  . HTN (hypertension) 10/06/2010  . Smoking 10/06/2010  . BPH (benign prostatic hyperplasia) 10/06/2010  . COLONIC POLYPS 10/17/2008  . CYSTS OF ORAL SOFT TISSUES 10/17/2008  . DIVERTICULITIS OF COLON 10/17/2008  . Human immunodeficiency virus (HIV) disease ([place]) 06/06/2008  . THRUSH 06/06/2008  . Pancytopenia ([place]) 06/06/2008  . LUNG NODULE 06/06/2008  . RENAL CALCULUS 06/06/2008   Past Medical History:  Diagnosis Date  . BPH (benign prostatic hyperplasia)   . Erectile dysfunction    . HIV infection ([place])   . Pruritic rash 04/21/2015  . Secondary polycythemia 05/26/2016  . Syphilis 04/19/2018   Past Surgical History:  Procedure Laterality Date  . PARTIAL COLECTOMY     No Known Allergies Prior to Admission medications   Medication Sig Start Date End Date Taking? Authorizing Provider  amLODipine (NORVASC) 5 MG tablet Take 1 tablet (5 mg total) by mouth daily. 12/04/18  Yes Wendie Agreste, MD  GENVOYA 150-150-200-10 MG TABS tablet TAKE 1 TABLET BY MOUTH EVERY DAY WITH BREAKFAST 10/24/18  Yes Tommy Medal, Lavell Islam, MD   Social History   Socioeconomic History  . Marital status: Single    Spouse name: Not on file  . Number of children: Not on file  . Years of education: Not on file  . Highest education level: Not on file  Occupational History  . Not on file  Social Needs  . Financial resource strain: Not on file  . Food insecurity    Worry: Not on file    Inability: Not on file  . Transportation needs    Medical: Not on file    Non-medical: Not on file  Tobacco Use  . Smoking status: Current Every Day Smoker    Packs/day: 0.80    Types: Cigarettes    Start date: 08/01/2016  . Smokeless tobacco: Never Used  . Tobacco comment: 1/2 pack daily  Substance and Sexual Activity  . Alcohol use: Yes    Alcohol/week: 0.0 standard drinks    Comment:  social  . Drug use: No  . Sexual activity: Never    Comment: given condoms  Lifestyle  . Physical activity    Days per week: Not on file    Minutes per session: Not on file  . Stress: Not on file  Relationships  . Social Herbalist on phone: Not on file    Gets together: Not on file    Attends religious service: Not on file    Active member of club or organization: Not on file    Attends meetings of clubs or organizations: Not on file    Relationship status: Not on file  . Intimate partner violence    Fear of current or ex partner: Not on file    Emotionally abused: Not on file    Physically abused:  Not on file    Forced sexual activity: Not on file  Other Topics Concern  . Not on file  Social History Narrative  . Not on file    Review of Systems  Constitutional: Negative for fatigue and unexpected weight change.  Eyes: Negative for visual disturbance.  Respiratory: Negative for cough, chest tightness and shortness of breath.   Cardiovascular: Negative for chest pain, palpitations and leg swelling.  Gastrointestinal: Negative for abdominal pain and blood in stool.  Neurological: Negative for dizziness, light-headedness and headaches.     Objective:   Vitals:   12/21/18 0941  BP: (!) 145/82  Pulse: 75  Temp: 98.9 F (37.2 C)  TempSrc: Oral  SpO2: 97%  Weight: 188 lb 9.6 oz (85.5 kg)     Physical Exam Vitals signs reviewed.  Constitutional:      Appearance: He is well-developed.  HENT:     Head: Normocephalic and atraumatic.  Eyes:     Pupils: Pupils are equal, round, and reactive to light.  Neck:     Vascular: No carotid bruit or JVD.  Cardiovascular:     Rate and Rhythm: Normal rate and regular rhythm.     Heart sounds: Normal heart sounds. No murmur.  Pulmonary:     Effort: Pulmonary effort is normal.     Breath sounds: Normal breath sounds. No rales.  Skin:    General: Skin is warm and dry.       Neurological:     Mental Status: He is alert and oriented to person, place, and time.        Assessment & Plan:  [name] is a 68 y.o. male . Rash and nonspecific skin eruption Rash of face  -Improving.  Stop hydrogen peroxide.  Cetaphil discussed for facial cleansing.  Topical hydrocortisone to other itchy areas as needed, dermatology eval if not improving in the next 4 to 6 weeks with RTC precautions discussed.  Essential hypertension  -Recent start of  amlodipine, improving.  Increased dosing as option but would like to try changing on current dose.  Handout given.  Recheck in 2 months, sooner if persistent elevations.  No orders of the  defined types were placed in this encounter.  Patient Instructions   Same dose amlodipine. See info on controlling blood pressure. If readings at home remain over 140/90 in next few weeks, let me know.  Recheck in 2 months.   Rash looks better.  Avoid hydrogen peroxide for now.  Cetaphil face cleanser may be milder and less likely to cause redness.  Eucerin, Aveeno, or Cetaphil lotion for other dry areas.  Okay to continue hydrocortisone for elbow or other  areas if needed, but if not continuing to resolve in the next 4 to 6 weeks or any worsening sooner, would recommend evaluation with dermatology.  Return to the clinic or go to the nearest emergency room if any of your symptoms worsen or new symptoms occur.     Managing Your Hypertension Hypertension is commonly called high blood pressure. This is when the force of your blood pressing against the walls of your arteries is too strong. Arteries are blood vessels that carry blood from your heart throughout your body. Hypertension forces the heart to work harder to pump blood, and may cause the arteries to become narrow or stiff. Having untreated or uncontrolled hypertension can cause heart attack, stroke, kidney disease, and other problems. What are blood pressure readings? A blood pressure reading consists of a higher number over a lower number. Ideally, your blood pressure should be below 120/80. The first ("top") number is called the systolic pressure. It is a measure of the pressure in your arteries as your heart beats. The second ("bottom") number is called the diastolic pressure. It is a measure of the pressure in your arteries as the heart relaxes. What does my blood pressure reading mean? Blood pressure is classified into four stages. Based on your blood pressure reading, your health care provider may use the following stages to determine what type of treatment you need, if any. Systolic pressure and diastolic pressure are measured in a unit  called mm Hg. Normal  Systolic pressure: below 123456.  Diastolic pressure: below 80. Elevated  Systolic pressure: [id number].  Diastolic pressure: below 80. Hypertension stage 1  Systolic pressure: 0000000.  Diastolic pressure: [id number]. Hypertension stage 2  Systolic pressure: [id number] or above.  Diastolic pressure: 90 or above. What health risks are associated with hypertension? Managing your hypertension is an important responsibility. Uncontrolled hypertension can lead to:  A heart attack.  A stroke.  A weakened blood vessel (aneurysm).  Heart failure.  Kidney damage.  Eye damage.  Metabolic syndrome.  Memory and concentration problems. What changes can I make to manage my hypertension? Hypertension can be managed by making lifestyle changes and possibly by taking medicines. Your health care provider will help you make a plan to bring your blood pressure within a normal range. Eating and drinking   Eat a diet that is high in fiber and potassium, and low in salt (sodium), added sugar, and fat. An example eating plan is called the DASH (Dietary Approaches to Stop Hypertension) diet. To eat this way: ? Eat plenty of fresh fruits and vegetables. Try to fill half of your plate at each meal with fruits and vegetables. ? Eat whole grains, such as whole wheat pasta, brown rice, or whole grain bread. Fill about one quarter of your plate with whole grains. ? Eat low-fat diary products. ? Avoid fatty cuts of meat, processed or cured meats, and poultry with skin. Fill about one quarter of your plate with lean proteins such as fish, chicken without skin, beans, eggs, and tofu. ? Avoid premade and processed foods. These tend to be higher in sodium, added sugar, and fat.  Reduce your daily sodium intake. Most people with hypertension should eat less than 1,500 mg of sodium a day.  Limit alcohol intake to no more than 1 drink a day for nonpregnant women and 2 drinks a day for men. One  drink equals 12 oz of beer, 5 oz of wine, or 1 oz of hard liquor. Lifestyle  Work with your health  care provider to maintain a healthy body weight, or to lose weight. Ask what an ideal weight is for you.  Get at least 30 minutes of exercise that causes your heart to beat faster (aerobic exercise) most days of the week. Activities may include walking, swimming, or biking.  Include exercise to strengthen your muscles (resistance exercise), such as weight lifting, as part of your weekly exercise routine. Try to do these types of exercises for 30 minutes at least 3 days a week.  Do not use any products that contain nicotine or tobacco, such as cigarettes and e-cigarettes. If you need help quitting, ask your health care provider.  Control any long-term (chronic) conditions you have, such as high cholesterol or diabetes. Monitoring  Monitor your blood pressure at home as told by your health care provider. Your personal target blood pressure may vary depending on your medical conditions, your age, and other factors.  Have your blood pressure checked regularly, as often as told by your health care provider. Working with your health care provider  Review all the medicines you take with your health care provider because there may be side effects or interactions.  Talk with your health care provider about your diet, exercise habits, and other lifestyle factors that may be contributing to hypertension.  Visit your health care provider regularly. Your health care provider can help you create and adjust your plan for managing hypertension. Will I need medicine to control my blood pressure? Your health care provider may prescribe medicine if lifestyle changes are not enough to get your blood pressure under control, and if:  Your systolic blood pressure is 130 or higher.  Your diastolic blood pressure is 80 or higher. Take medicines only as told by your health care provider. Follow the directions  carefully. Blood pressure medicines must be taken as prescribed. The medicine does not work as well when you skip doses. Skipping doses also puts you at risk for problems. Contact a health care provider if:  You think you are having a reaction to medicines you have taken.  You have repeated (recurrent) headaches.  You feel dizzy.  You have swelling in your ankles.  You have trouble with your vision. Get help right away if:  You develop a severe headache or confusion.  You have unusual weakness or numbness, or you feel faint.  You have severe pain in your chest or abdomen.  You vomit repeatedly.  You have trouble breathing. Summary  Hypertension is when the force of blood pumping through your arteries is too strong. If this condition is not controlled, it may put you at risk for serious complications.  Your personal target blood pressure may vary depending on your medical conditions, your age, and other factors. For most people, a normal blood pressure is less than 120/80.  Hypertension is managed by lifestyle changes, medicines, or both. Lifestyle changes include weight loss, eating a healthy, low-sodium diet, exercising more, and limiting alcohol. This information is not intended to replace advice given to you by your health care provider. Make sure you discuss any questions you have with your health care provider. Document Released: 10/13/2011 Document Revised: 05/12/2018 Document Reviewed: 12/17/2015 Elsevier Patient Education  [employer].    If you have lab work done today you will be contacted with your lab results within the next 2 weeks.  If you have not heard from [place] then please contact us. The fastest way to get your results is to register for My Chart.  IF you received an x-ray today, you will receive an invoice from [facility]. Please contact [facility] at [phone] with questions or concerns regarding your invoice.   IF you received  labwork today, you will receive an invoice from [facility]. Please contact [facility] at [phone] with questions or concerns regarding your invoice.   Our billing staff will not be able to assist you with questions regarding bills from these companies.  You will be contacted with the lab results as soon as they are available. The fastest way to get your results is to activate your My Chart account. Instructions are located on the last page of this paperwork. If you have not heard from [place] regarding the results in 2 weeks, please contact this office.          Signed, Merri Ray, MD [facility]

## 2019-01-15 ENCOUNTER — Encounter: Payer: Self-pay | Admitting: Family Medicine

## 2019-01-17 ENCOUNTER — Other Ambulatory Visit: Payer: Self-pay | Admitting: *Deleted

## 2019-01-17 DIAGNOSIS — R21 Rash and other nonspecific skin eruption: Secondary | ICD-10-CM

## 2019-01-28 ENCOUNTER — Other Ambulatory Visit: Payer: Self-pay | Admitting: Family Medicine

## 2019-01-28 DIAGNOSIS — I10 Essential (primary) hypertension: Secondary | ICD-10-CM

## 2019-01-28 NOTE — Telephone Encounter (Signed)
Forwarding medication refill request to the clinical pool for review. 

## 2019-01-29 ENCOUNTER — Encounter: Payer: Self-pay | Admitting: Family Medicine

## 2019-02-08 ENCOUNTER — Encounter: Payer: Self-pay | Admitting: Family Medicine

## 2019-02-22 ENCOUNTER — Encounter: Payer: Self-pay | Admitting: Family Medicine

## 2019-02-22 ENCOUNTER — Other Ambulatory Visit: Payer: Self-pay

## 2019-02-22 ENCOUNTER — Ambulatory Visit (INDEPENDENT_AMBULATORY_CARE_PROVIDER_SITE_OTHER): Payer: Medicare Other | Admitting: Family Medicine

## 2019-02-22 ENCOUNTER — Encounter: Payer: Self-pay | Admitting: Gastroenterology

## 2019-02-22 VITALS — BP 138/85 | HR 66 | Temp 97.7°F | Ht 70.0 in | Wt 185.6 lb

## 2019-02-22 DIAGNOSIS — Z1211 Encounter for screening for malignant neoplasm of colon: Secondary | ICD-10-CM | POA: Diagnosis not present

## 2019-02-22 DIAGNOSIS — F17210 Nicotine dependence, cigarettes, uncomplicated: Secondary | ICD-10-CM | POA: Diagnosis not present

## 2019-02-22 DIAGNOSIS — Z122 Encounter for screening for malignant neoplasm of respiratory organs: Secondary | ICD-10-CM

## 2019-02-22 DIAGNOSIS — I10 Essential (primary) hypertension: Secondary | ICD-10-CM | POA: Diagnosis not present

## 2019-02-22 MED ORDER — AMLODIPINE BESYLATE 5 MG PO TABS: ORAL_TABLET | ORAL | 1 refills | Status: DC

## 2019-02-22 NOTE — Patient Instructions (Addendum)
No change in dose of medications today. Continue to monitor blood pressure, and if remains over 130/80, follow-up to discuss.  I will refer you for lung cancer screening as well as to gastroenterology to discuss colon cancer screening. When you are ready to quit smoking, let me know if I can be of assistance.  Thank you for coming in today and take care.   Steps to Quit Smoking Smoking tobacco is the leading cause of preventable death. It can affect almost every organ in the body. Smoking puts you and those around you at risk for developing many serious chronic diseases. Quitting smoking can be difficult, but it is one of the best things that you can do for your health. It is never too late to quit. How do I get ready to quit? When you decide to quit smoking, create a plan to help you succeed. Before you quit:  Pick a date to quit. Set a date within the next 2 weeks to give you time to prepare.  Write down the reasons why you are quitting. Keep this list in places where you will see it often.  Tell your family, friends, and co-workers that you are quitting. Support from your loved ones can make quitting easier.  Talk with your health care provider about your options for quitting smoking.  Find out what treatment options are covered by your health insurance.  Identify people, places, things, and activities that make you want to smoke (triggers). Avoid them. What first steps can I take to quit smoking?  Throw away all cigarettes at home, at work, and in your car.  Throw away smoking accessories, such as Scientist, research (medical).  Clean your car. Make sure to empty the ashtray.  Clean your home, including curtains and carpets. What strategies can I use to quit smoking? Talk with your health care provider about combining strategies, such as taking medicines while you are also receiving in-person counseling. Using these two strategies together makes you more likely to succeed in quitting  than if you used either strategy on its own.  If you are pregnant or breastfeeding, talk with your health care provider about finding counseling or other support strategies to quit smoking. Do not take medicine to help you quit smoking unless your health care provider tells you to do so. To quit smoking: Quit right away  Quit smoking completely, instead of gradually reducing how much you smoke over a period of time. Research shows that stopping smoking right away is more successful than gradually quitting.  Attend in-person counseling to help you build problem-solving skills. You are more likely to succeed in quitting if you attend counseling sessions regularly. Even short sessions of 10 minutes can be effective. Take medicine You may take medicines to help you quit smoking. Some medicines require a prescription and some you can purchase over-the-counter. Medicines may have nicotine in them to replace the nicotine in cigarettes. Medicines may:  Help to stop cravings.  Help to relieve withdrawal symptoms. Your health care provider may recommend:  Nicotine patches, gum, or lozenges.  Nicotine inhalers or sprays.  Non-nicotine medicine that is taken by mouth. Find resources Find resources and support systems that can help you to quit smoking and remain smoke-free after you quit. These resources are most helpful when you use them often. They include:  Online chats with a Social worker.  Telephone quitlines.  Printed Furniture conservator/restorer.  Support groups or group counseling.  Text messaging programs.  Mobile phone apps or applications.  Use apps that can help you stick to your quit plan by providing reminders, tips, and encouragement. There are many free apps for mobile devices as well as websites. Examples include Quit Guide from the [employer] and smokefree.gov What things can I do to make it easier to quit?   Reach out to your family and friends for support and encouragement. Call telephone  quitlines (1-800-QUIT-NOW), reach out to support groups, or work with a counselor for support.  Ask people who smoke to avoid smoking around you.  Avoid places that trigger you to smoke, such as bars, parties, or smoke-break areas at work.  Spend time with people who do not smoke.  Lessen the stress in your life. Stress can be a smoking trigger for some people. To lessen stress, try: ? Exercising regularly. ? Doing deep-breathing exercises. ? Doing yoga. ? Meditating. ? Performing a body scan. This involves closing your eyes, scanning your body from head to toe, and noticing which parts of your body are particularly tense. Try to relax the muscles in those areas. How will I feel when I quit smoking? Day 1 to 3 weeks Within the first 24 hours of quitting smoking, you may start to feel withdrawal symptoms. These symptoms are usually most noticeable 2-3 days after quitting, but they usually do not last for more than 2-3 weeks. You may experience these symptoms:  Mood swings.  Restlessness, anxiety, or irritability.  Trouble concentrating.  Dizziness.  Strong cravings for sugary foods and nicotine.  Mild weight gain.  Constipation.  Nausea.  Coughing or a sore throat.  Changes in how the medicines that you take for unrelated issues work in your body.  Depression.  Trouble sleeping (insomnia). Week 3 and afterward After the first 2-3 weeks of quitting, you may start to notice more positive results, such as:  Improved sense of smell and taste.  Decreased coughing and sore throat.  Slower heart rate.  Lower blood pressure.  Clearer skin.  The ability to breathe more easily.  Fewer sick days. Quitting smoking can be very challenging. Do not get discouraged if you are not successful the first time. Some people need to make many attempts to quit before they achieve long-term success. Do your best to stick to your quit plan, and talk with your health care provider if you  have any questions or concerns. Summary  Smoking tobacco is the leading cause of preventable death. Quitting smoking is one of the best things that you can do for your health.  When you decide to quit smoking, create a plan to help you succeed.  Quit smoking right away, not slowly over a period of time.  When you start quitting, seek help from your health care provider, family, or friends. This information is not intended to replace advice given to you by your health care provider. Make sure you discuss any questions you have with your health care provider. Document Revised: 10/13/2018 Document Reviewed: 04/08/2018 Elsevier Patient Education  [employer].   If you have lab work done today you will be contacted with your lab results within the next 2 weeks.  If you have not heard from [place] then please contact us. The fastest way to get your results is to register for My Chart.   IF you received an x-ray today, you will receive an invoice from [facility]. Please contact [facility] at [phone] with questions or concerns regarding your invoice.   IF you received labwork today, you will receive  an Pharmacologist from [employer]. Please contact [facility] at [phone] with questions or concerns regarding your invoice.   Our billing staff will not be able to assist you with questions regarding bills from these companies.  You will be contacted with the lab results as soon as they are available. The fastest way to get your results is to activate your My Chart account. Instructions are located on the last page of this paperwork. If you have not heard from [place] regarding the results in 2 weeks, please contact this office.

## 2019-02-22 NOTE — Progress Notes (Signed)
Subjective:  Patient ID: [name], male    DOB: 1950/04/07  Age: 69 y.o. MRN: [id number]  CC:  Chief Complaint  Patient presents with  . Follow-up    on hypertension. pt has no physical symptoms of hypertention. pt states he feels good with no complaints. pt states no side effects from currentmedication and it seems to be working well.    HPI [name] presents for   Hypertension: norvasc 5mg  qd.  Borderline elevated in November.  Continued on same dose amlodipine at that time. Has appt with infectious disease specialist on February 23, lab work planned for that visit. Home readings: 118-142/59-82. Usually in 110's - [id number] range systolic.  No new side effects. BP Readings from Last 3 Encounters:  02/22/19 138/85  12/21/18 (!) 145/82  12/04/18 (!) 181/80   Lab Results  Component Value Date   CREATININE 1.21 10/11/2018   Nicotine addiction: Smoking just over a pack a day.  Prior quit attempt 10 years ago - quit for 4 years.  50 yrs, 1.5 packs per day. approx 69 pack year history.  No plan to quit at this time. No recent chest CT.   Last chest   Saw dermatology - different steroid cream hydrocortisone 2.5%, and salve - rash has resolved.   HM: Colon: history of polyps removed, diverticulitis. Polypectomy. Unsure of provider - 20 years ago - possibly [facility].    History Patient Active Problem List   Diagnosis Date Noted  . Syphilis 04/19/2018  . Secondary polycythemia 05/26/2016  . Pruritic rash 04/21/2015  . Erectile dysfunction 03/02/2012  . Allergic conjunctivitis 03/02/2012  . HTN (hypertension) 10/06/2010  . Smoking 10/06/2010  . BPH (benign prostatic hyperplasia) 10/06/2010  . COLONIC POLYPS 10/17/2008  . CYSTS OF ORAL SOFT TISSUES 10/17/2008  . DIVERTICULITIS OF COLON 10/17/2008  . Human immunodeficiency virus (HIV) disease ([place]) 06/06/2008  . THRUSH 06/06/2008  . Pancytopenia ([place]) 06/06/2008  . LUNG NODULE 06/06/2008  . RENAL CALCULUS  06/06/2008   Past Medical History:  Diagnosis Date  . BPH (benign prostatic hyperplasia)   . Erectile dysfunction   . HIV infection ([place])   . Pruritic rash 04/21/2015  . Secondary polycythemia 05/26/2016  . Syphilis 04/19/2018   Past Surgical History:  Procedure Laterality Date  . PARTIAL COLECTOMY     No Known Allergies Prior to Admission medications   Medication Sig Start Date End Date Taking? Authorizing Provider  amLODipine (NORVASC) 5 MG tablet TAKE 1 TABLET(5 MG) BY MOUTH DAILY 01/29/19  Yes Wendie Agreste, MD  GENVOYA 150-150-200-10 MG TABS tablet TAKE 1 TABLET BY MOUTH EVERY DAY WITH BREAKFAST 10/24/18  Yes Tommy Medal, Lavell Islam, MD   Social History   Socioeconomic History  . Marital status: Single    Spouse name: Not on file  . Number of children: Not on file  . Years of education: Not on file  . Highest education level: Not on file  Occupational History  . Not on file  Tobacco Use  . Smoking status: Current Every Day Smoker    Packs/day: 0.80    Types: Cigarettes    Start date: 08/01/2016  . Smokeless tobacco: Never Used  . Tobacco comment: 1/2 pack daily  Substance and Sexual Activity  . Alcohol use: Yes    Alcohol/week: 0.0 standard drinks    Comment: social  . Drug use: No  . Sexual activity: Never    Comment: given condoms  Other Topics Concern  . Not  on file  Social History Narrative  . Not on file   Social Determinants of Health   Financial Resource Strain:   . Difficulty of Paying Living Expenses: Not on file  Food Insecurity:   . Worried About Charity fundraiser in the Last Year: Not on file  . Ran Out of Food in the Last Year: Not on file  Transportation Needs:   . Lack of Transportation (Medical): Not on file  . Lack of Transportation (Non-Medical): Not on file  Physical Activity:   . Days of Exercise per Week: Not on file  . Minutes of Exercise per Session: Not on file  Stress:   . Feeling of Stress : Not on file  Social Connections:    . Frequency of Communication with Friends and Family: Not on file  . Frequency of Social Gatherings with Friends and Family: Not on file  . Attends Religious Services: Not on file  . Active Member of Clubs or Organizations: Not on file  . Attends Archivist Meetings: Not on file  . Marital Status: Not on file  Intimate Partner Violence:   . Fear of Current or Ex-Partner: Not on file  . Emotionally Abused: Not on file  . Physically Abused: Not on file  . Sexually Abused: Not on file    Review of Systems  Constitutional: Negative for fatigue and unexpected weight change.  Eyes: Negative for visual disturbance.  Respiratory: Negative for cough, chest tightness and shortness of breath.   Cardiovascular: Negative for chest pain, palpitations and leg swelling.  Gastrointestinal: Negative for abdominal pain and blood in stool.  Neurological: Negative for dizziness, light-headedness and headaches.     Objective:   Vitals:   02/22/19 0836  BP: 138/85  Pulse: 66  Temp: 97.7 F (36.5 C)  TempSrc: Temporal  SpO2: 99%  Weight: 185 lb 9.6 oz (84.2 kg)  Height: 5\' 10"  (1.778 m)     Physical Exam Vitals reviewed.  Constitutional:      Appearance: He is well-developed.  HENT:     Head: Normocephalic and atraumatic.  Eyes:     Pupils: Pupils are equal, round, and reactive to light.  Neck:     Vascular: No carotid bruit or JVD.  Cardiovascular:     Rate and Rhythm: Normal rate and regular rhythm.     Heart sounds: Normal heart sounds. No murmur.  Pulmonary:     Effort: Pulmonary effort is normal.     Breath sounds: Normal breath sounds. No rales.  Skin:    General: Skin is warm and dry.  Neurological:     Mental Status: He is alert and oriented to person, place, and time.        Assessment & Plan:  [name] is a 69 y.o. male . Essential hypertension - Plan: amLODipine (NORVASC) 5 MG tablet  - Stable, tolerating current regimen. Medications refilled. Labs  pending as above.   Colon cancer screening - Plan: Ambulatory referral to Gastroenterology  -Reported history of polyps, diverticulitis.  May not be a candidate for Cologuard.  Will refer to gastroenterology to determine appropriate testing  Encounter for screening for lung cancer - Plan: Ambulatory Referral for Lung Cancer Scre Cigarette nicotine dependence without complication  -Smoking cessation discussed, precontemplative stage at this time.  Impact on his health will reviewed.  Refer for lung cancer screening, handout given on steps to quit smoking when he is ready.   Meds ordered this encounter  Medications  .  amLODipine (NORVASC) 5 MG tablet    Sig: TAKE 1 TABLET(5 MG) BY MOUTH DAILY    Dispense:  90 tablet    Refill:  1   Patient Instructions   No change in dose of medications today. Continue to monitor blood pressure, and if remains over 130/80, follow-up to discuss.  I will refer you for lung cancer screening as well as to gastroenterology to discuss colon cancer screening. When you are ready to quit smoking, let me know if I can be of assistance.  Thank you for coming in today and take care.   Steps to Quit Smoking Smoking tobacco is the leading cause of preventable death. It can affect almost every organ in the body. Smoking puts you and those around you at risk for developing many serious chronic diseases. Quitting smoking can be difficult, but it is one of the best things that you can do for your health. It is never too late to quit. How do I get ready to quit? When you decide to quit smoking, create a plan to help you succeed. Before you quit:  Pick a date to quit. Set a date within the next 2 weeks to give you time to prepare.  Write down the reasons why you are quitting. Keep this list in places where you will see it often.  Tell your family, friends, and co-workers that you are quitting. Support from your loved ones can make quitting easier.  Talk with your  health care provider about your options for quitting smoking.  Find out what treatment options are covered by your health insurance.  Identify people, places, things, and activities that make you want to smoke (triggers). Avoid them. What first steps can I take to quit smoking?  Throw away all cigarettes at home, at work, and in your car.  Throw away smoking accessories, such as Scientist, research (medical).  Clean your car. Make sure to empty the ashtray.  Clean your home, including curtains and carpets. What strategies can I use to quit smoking? Talk with your health care provider about combining strategies, such as taking medicines while you are also receiving in-person counseling. Using these two strategies together makes you more likely to succeed in quitting than if you used either strategy on its own.  If you are pregnant or breastfeeding, talk with your health care provider about finding counseling or other support strategies to quit smoking. Do not take medicine to help you quit smoking unless your health care provider tells you to do so. To quit smoking: Quit right away  Quit smoking completely, instead of gradually reducing how much you smoke over a period of time. Research shows that stopping smoking right away is more successful than gradually quitting.  Attend in-person counseling to help you build problem-solving skills. You are more likely to succeed in quitting if you attend counseling sessions regularly. Even short sessions of 10 minutes can be effective. Take medicine You may take medicines to help you quit smoking. Some medicines require a prescription and some you can purchase over-the-counter. Medicines may have nicotine in them to replace the nicotine in cigarettes. Medicines may:  Help to stop cravings.  Help to relieve withdrawal symptoms. Your health care provider may recommend:  Nicotine patches, gum, or lozenges.  Nicotine inhalers or sprays.  Non-nicotine  medicine that is taken by mouth. Find resources Find resources and support systems that can help you to quit smoking and remain smoke-free after you quit. These resources are most helpful  when you use them often. They include:  Online chats with a Social worker.  Telephone quitlines.  Printed Furniture conservator/restorer.  Support groups or group counseling.  Text messaging programs.  Mobile phone apps or applications. Use apps that can help you stick to your quit plan by providing reminders, tips, and encouragement. There are many free apps for mobile devices as well as websites. Examples include Quit Guide from the [employer] and smokefree.gov What things can I do to make it easier to quit?   Reach out to your family and friends for support and encouragement. Call telephone quitlines (1-800-QUIT-NOW), reach out to support groups, or work with a counselor for support.  Ask people who smoke to avoid smoking around you.  Avoid places that trigger you to smoke, such as bars, parties, or smoke-break areas at work.  Spend time with people who do not smoke.  Lessen the stress in your life. Stress can be a smoking trigger for some people. To lessen stress, try: ? Exercising regularly. ? Doing deep-breathing exercises. ? Doing yoga. ? Meditating. ? Performing a body scan. This involves closing your eyes, scanning your body from head to toe, and noticing which parts of your body are particularly tense. Try to relax the muscles in those areas. How will I feel when I quit smoking? Day 1 to 3 weeks Within the first 24 hours of quitting smoking, you may start to feel withdrawal symptoms. These symptoms are usually most noticeable 2-3 days after quitting, but they usually do not last for more than 2-3 weeks. You may experience these symptoms:  Mood swings.  Restlessness, anxiety, or irritability.  Trouble concentrating.  Dizziness.  Strong cravings for sugary foods and nicotine.  Mild weight  gain.  Constipation.  Nausea.  Coughing or a sore throat.  Changes in how the medicines that you take for unrelated issues work in your body.  Depression.  Trouble sleeping (insomnia). Week 3 and afterward After the first 2-3 weeks of quitting, you may start to notice more positive results, such as:  Improved sense of smell and taste.  Decreased coughing and sore throat.  Slower heart rate.  Lower blood pressure.  Clearer skin.  The ability to breathe more easily.  Fewer sick days. Quitting smoking can be very challenging. Do not get discouraged if you are not successful the first time. Some people need to make many attempts to quit before they achieve long-term success. Do your best to stick to your quit plan, and talk with your health care provider if you have any questions or concerns. Summary  Smoking tobacco is the leading cause of preventable death. Quitting smoking is one of the best things that you can do for your health.  When you decide to quit smoking, create a plan to help you succeed.  Quit smoking right away, not slowly over a period of time.  When you start quitting, seek help from your health care provider, family, or friends. This information is not intended to replace advice given to you by your health care provider. Make sure you discuss any questions you have with your health care provider. Document Revised: 10/13/2018 Document Reviewed: 04/08/2018 Elsevier Patient Education  [employer].   If you have lab work done today you will be contacted with your lab results within the next 2 weeks.  If you have not heard from [place] then please contact us. The fastest way to get your results is to register for My Chart.   IF you  received an x-ray today, you will receive an invoice from [facility]. Please contact [facility] at [phone] with questions or concerns regarding your invoice.   IF you received labwork today, you will  receive an invoice from [place] [facility]. Please contact [facility] at [phone] with questions or concerns regarding your invoice.   Our billing staff will not be able to assist you with questions regarding bills from these companies.  You will be contacted with the lab results as soon as they are available. The fastest way to get your results is to activate your My Chart account. Instructions are located on the last page of this paperwork. If you have not heard from [place] regarding the results in 2 weeks, please contact this office.         Signed, Merri Ray, MD [facility]

## 2019-02-28 ENCOUNTER — Other Ambulatory Visit: Payer: Self-pay

## 2019-02-28 ENCOUNTER — Ambulatory Visit (AMBULATORY_SURGERY_CENTER): Payer: Self-pay | Admitting: *Deleted

## 2019-02-28 VITALS — Temp 96.8°F | Ht 70.0 in | Wt 186.4 lb

## 2019-02-28 DIAGNOSIS — Z1211 Encounter for screening for malignant neoplasm of colon: Secondary | ICD-10-CM

## 2019-02-28 DIAGNOSIS — Z01818 Encounter for other preprocedural examination: Secondary | ICD-10-CM

## 2019-02-28 MED ORDER — NA SULFATE-K SULFATE-MG SULF 17.5-3.13-1.6 GM/177ML PO SOLN: 1.0000 | Freq: Once | ORAL | 0 refills | Status: AC

## 2019-02-28 NOTE — Progress Notes (Signed)

## 2019-03-06 NOTE — Addendum Note (Signed)
Addended by: Dolan Amen D on: 03/06/2019 03:35 PM   Modules accepted: Orders

## 2019-03-07 ENCOUNTER — Ambulatory Visit: Payer: Medicare Other

## 2019-03-07 ENCOUNTER — Other Ambulatory Visit: Payer: Medicare Other

## 2019-03-07 ENCOUNTER — Other Ambulatory Visit: Payer: Self-pay

## 2019-03-07 DIAGNOSIS — Z113 Encounter for screening for infections with a predominantly sexual mode of transmission: Secondary | ICD-10-CM

## 2019-03-07 DIAGNOSIS — B20 Human immunodeficiency virus [HIV] disease: Secondary | ICD-10-CM

## 2019-03-07 DIAGNOSIS — A539 Syphilis, unspecified: Secondary | ICD-10-CM

## 2019-03-07 DIAGNOSIS — Z79899 Other long term (current) drug therapy: Secondary | ICD-10-CM

## 2019-03-08 ENCOUNTER — Other Ambulatory Visit: Payer: Self-pay | Admitting: Gastroenterology

## 2019-03-08 ENCOUNTER — Ambulatory Visit (INDEPENDENT_AMBULATORY_CARE_PROVIDER_SITE_OTHER): Payer: Medicare Other

## 2019-03-08 DIAGNOSIS — Z1159 Encounter for screening for other viral diseases: Secondary | ICD-10-CM

## 2019-03-08 LAB — SARS CORONAVIRUS 2 (TAT 6-24 HRS): SARS Coronavirus 2: NEGATIVE

## 2019-03-08 LAB — T-HELPER CELL (CD4) - (RCID CLINIC ONLY)
CD4 % Helper T Cell: 25 % — ABNORMAL LOW (ref 33–65)
CD4 T Cell Abs: 426 /uL (ref 400–1790)

## 2019-03-09 LAB — HIV-1 RNA QUANT-NO REFLEX-BLD
HIV 1 RNA Quant: 40 copies/mL — ABNORMAL HIGH
HIV-1 RNA Quant, Log: 1.6 Log copies/mL — ABNORMAL HIGH

## 2019-03-09 LAB — CBC WITH DIFFERENTIAL/PLATELET
Absolute Monocytes: 420 cells/uL (ref 200–950)
Basophils Absolute: 18 cells/uL (ref 0–200)
Basophils Relative: 0.3 %
Eosinophils Absolute: 78 cells/uL (ref 15–500)
Eosinophils Relative: 1.3 %
HCT: 48.1 % (ref 38.5–50.0)
Hemoglobin: 17.2 g/dL — ABNORMAL HIGH (ref 13.2–17.1)
Lymphs Abs: 1788 cells/uL (ref 850–3900)
MCH: 33.5 pg — ABNORMAL HIGH (ref 27.0–33.0)
MCHC: 35.8 g/dL (ref 32.0–36.0)
MCV: 93.8 fL (ref 80.0–100.0)
MPV: 10.4 fL (ref 7.5–12.5)
Monocytes Relative: 7 %
Neutro Abs: 3696 cells/uL (ref 1500–7800)
Neutrophils Relative %: 61.6 %
Platelets: 220 10*3/uL (ref 140–400)
RBC: 5.13 10*6/uL (ref 4.20–5.80)
RDW: 13 % (ref 11.0–15.0)
Total Lymphocyte: 29.8 %
WBC: 6 10*3/uL (ref 3.8–10.8)

## 2019-03-09 LAB — LIPID PANEL
Cholesterol: 154 mg/dL (ref <200)
HDL: 43 mg/dL (ref >=40)
LDL Cholesterol (Calc): 91 mg/dL (calc)
Non-HDL Cholesterol (Calc): 111 mg/dL (calc) (ref <130)
Total CHOL/HDL Ratio: 3.6 (calc) (ref <5.0)
Triglycerides: 105 mg/dL (ref <150)

## 2019-03-09 LAB — RPR: RPR Ser Ql: REACTIVE — AB

## 2019-03-09 LAB — COMPLETE METABOLIC PANEL WITH GFR
AG Ratio: 1.7 (calc) (ref 1.0–2.5)
ALT: 23 U/L (ref 9–46)
AST: 17 U/L (ref 10–35)
Albumin: 4.6 g/dL (ref 3.6–5.1)
Alkaline phosphatase (APISO): 63 U/L (ref 35–144)
BUN: 13 mg/dL (ref 7–25)
CO2: 30 mmol/L (ref 20–32)
Calcium: 10.1 mg/dL (ref 8.6–10.3)
Chloride: 103 mmol/L (ref 98–110)
Creat: 1.1 mg/dL (ref 0.70–1.25)
GFR, Est African American: 80 mL/min/{1.73_m2} (ref >=60)
GFR, Est Non African American: 69 mL/min/{1.73_m2} (ref >=60)
Globulin: 2.7 g/dL (calc) (ref 1.9–3.7)
Glucose, Bld: 89 mg/dL (ref 65–99)
Potassium: 4.5 mmol/L (ref 3.5–5.3)
Sodium: 140 mmol/L (ref 135–146)
Total Bilirubin: 0.5 mg/dL (ref 0.2–1.2)
Total Protein: 7.3 g/dL (ref 6.1–8.1)

## 2019-03-09 LAB — RPR TITER: RPR Titer: 1:4 {titer} — ABNORMAL HIGH

## 2019-03-09 LAB — FLUORESCENT TREPONEMAL AB(FTA)-IGG-BLD: Fluorescent Treponemal ABS: REACTIVE — AB

## 2019-03-13 ENCOUNTER — Other Ambulatory Visit: Payer: Self-pay

## 2019-03-13 ENCOUNTER — Encounter: Payer: Self-pay | Admitting: Gastroenterology

## 2019-03-13 ENCOUNTER — Ambulatory Visit (AMBULATORY_SURGERY_CENTER): Payer: Medicare Other | Admitting: Gastroenterology

## 2019-03-13 VITALS — BP 157/79 | HR 67 | Temp 97.1°F | Resp 23 | Ht 70.0 in | Wt 186.0 lb

## 2019-03-13 DIAGNOSIS — D120 Benign neoplasm of cecum: Secondary | ICD-10-CM

## 2019-03-13 DIAGNOSIS — Z1211 Encounter for screening for malignant neoplasm of colon: Secondary | ICD-10-CM

## 2019-03-13 MED ORDER — SODIUM CHLORIDE 0.9 % IV SOLN: 500.0000 mL | Freq: Once | INTRAVENOUS | Status: DC

## 2019-03-13 NOTE — Patient Instructions (Signed)
Discharge instructions given. Handouts on polyps,diverticulosis and hemorrhoids. Contrast given in Recovery. Office will call and set up CT Scan of the abdomin/pelvis with contrast. Resume previous medications. YOU HAD AN ENDOSCOPIC PROCEDURE TODAY AT [facility]:   Refer to the procedure report that was given to you for any specific questions about what was found during the examination.  If the procedure report does not answer your questions, please call your gastroenterologist to clarify.  If you requested that your care partner not be given the details of your procedure findings, then the procedure report has been included in a sealed envelope for you to review at your convenience later.  YOU SHOULD EXPECT: Some feelings of bloating in the abdomen. Passage of more gas than usual.  Walking can help get rid of the air that was put into your GI tract during the procedure and reduce the bloating. If you had a lower endoscopy (such as a colonoscopy or flexible sigmoidoscopy) you may notice spotting of blood in your stool or on the toilet paper. If you underwent a bowel prep for your procedure, you may not have a normal bowel movement for a few days.  Please Note:  You might notice some irritation and congestion in your nose or some drainage.  This is from the oxygen used during your procedure.  There is no need for concern and it should clear up in a day or so.  SYMPTOMS TO REPORT IMMEDIATELY:   Following lower endoscopy (colonoscopy or flexible sigmoidoscopy):  Excessive amounts of blood in the stool  Significant tenderness or worsening of abdominal pains  Swelling of the abdomen that is new, acute  Fever of 100F or higher   For urgent or emergent issues, a gastroenterologist can be reached at any hour by calling [phone].   DIET:  We do recommend a small meal at first, but then you may proceed to your regular diet.  Drink plenty of fluids but you should avoid alcoholic  beverages for 24 hours.  ACTIVITY:  You should plan to take it easy for the rest of today and you should NOT DRIVE or use heavy machinery until tomorrow (because of the sedation medicines used during the test).    FOLLOW UP: Our staff will call the number listed on your records 48-72 hours following your procedure to check on you and address any questions or concerns that you may have regarding the information given to you following your procedure. If we do not reach you, we will leave a message.  We will attempt to reach you two times.  During this call, we will ask if you have developed any symptoms of COVID 19. If you develop any symptoms (ie: fever, flu-like symptoms, shortness of breath, cough etc.) before then, please call [phone].  If you test positive for Covid 19 in the 2 weeks post procedure, please call and report this information to [place].    If any biopsies were taken you will be contacted by phone or by letter within the next 1-3 weeks.  Please call us at [phone] if you have not heard about the biopsies in 3 weeks.    SIGNATURES/CONFIDENTIALITY: You and/or your care partner have signed paperwork which will be entered into your electronic medical record.  These signatures attest to the fact that that the information above on your After Visit Summary has been reviewed and is understood.  Full responsibility of the confidentiality of this discharge information lies with you and/or your  care-partner.

## 2019-03-13 NOTE — Progress Notes (Signed)
Pt's states no medical or surgical changes since previsit or office visit.  JB - temp DT - vitals 

## 2019-03-13 NOTE — Progress Notes (Signed)
Called to room to assist during endoscopic procedure.  Patient ID and intended procedure confirmed with present staff. Received instructions for my participation in the procedure from the performing physician.  

## 2019-03-13 NOTE — Op Note (Signed)
[facility] Patient Name: [name] Procedure Date: 03/13/2019 11:43 AM MRN: [id number] Endoscopist: Remo Lipps P. Havery Moros , MD Age: 69 Referring MD:  Date of Birth: 05/11/1950 Gender: Male Account #: [id number] Procedure:                Colonoscopy Indications:              Screening for colorectal malignant neoplasm, This                            is the patient's first colonoscopy Medicines:                Monitored Anesthesia Care Procedure:                Pre-Anesthesia Assessment:                           - Prior to the procedure, a History and Physical                            was performed, and patient medications and                            allergies were reviewed. The patient's tolerance of                            previous anesthesia was also reviewed. The risks                            and benefits of the procedure and the sedation                            options and risks were discussed with the patient.                            All questions were answered, and informed consent                            was obtained. Prior Anticoagulants: The patient has                            taken no previous anticoagulant or antiplatelet                            agents. ASA Grade Assessment: III - A patient with                            severe systemic disease. After reviewing the risks                            and benefits, the patient was deemed in                            satisfactory condition to undergo the procedure.  After obtaining informed consent, the colonoscope                            was passed under direct vision. Throughout the                            procedure, the patient's blood pressure, pulse, and                            oxygen saturations were monitored continuously. The                            Colonoscope was introduced through the anus and                            advanced to the the  terminal ileum, with                            identification of the appendiceal orifice and IC                            valve. The colonoscopy was performed without                            difficulty. The patient tolerated the procedure                            well. The quality of the bowel preparation was                            adequate. The terminal ileum, ileocecal valve,                            appendiceal orifice, and rectum were photographed. Scope In: 11:51:25 AM Scope Out: 12:11:46 PM Scope Withdrawal Time: 0 hours 18 minutes 26 seconds  Total Procedure Duration: 0 hours 20 minutes 21 seconds  Findings:                 The perianal and digital rectal examinations were                            normal.                           The terminal ileum appeared normal.                           A large polypoid lesion was found in the cecum,                            overlying the AO. The lesion was multi-lobulated                            and appeared to be semi-pedunculated (some stalk  was visualized at the base). The appendiceal                            orifice could not be clearly identified, possible                            this lesion invades the AO. It was not overtly                            malignant appearing, but biopsies were taken with a                            cold forceps for histology. Tattoo was not placed                            given it's location.                           A few small-mouthed diverticula were found in the                            sigmoid colon and cecum.                           Internal hemorrhoids were found during                            retroflexion. The hemorrhoids were small.                           The exam was otherwise without abnormality. No                            other polyps noted. Complications:            No immediate complications. Estimated blood loss:                             Minimal. Estimated Blood Loss:     Estimated blood loss was minimal. Impression:               - The examined portion of the ileum was normal.                           - Polypoid lesion in the cecum overlying the                            appendiceal orifice as outlined. Biopsied.                           - Diverticulosis in the sigmoid colon and in the                            cecum.                           -  Internal hemorrhoids.                           - The examination was otherwise normal.                           CT scan will be needed to further evaluate this                            lesion. It may likely warrant surgical resection                            however if the appendix appears normal and no                            malignant features on CT scan, will discuss with                            Dr. Rush Landmark regarding possibility for endoscopic                            attempt at resection. Recommendation:           - Patient has a contact number available for                            emergencies. The signs and symptoms of potential                            delayed complications were discussed with the                            patient. Return to normal activities tomorrow.                            Written discharge instructions were provided to the                            patient.                           - Resume previous diet.                           - Continue present medications.                           - Await pathology results.                           - CT scan of the abdomen / pelvis with contrast,                            our office will coordinate. Remo Lipps P. Collin Hendley, MD 03/13/2019 12:20:25 PM This report has been signed electronically.

## 2019-03-13 NOTE — Progress Notes (Signed)
Report to PACU, RN, vss, BBS= Clear.  

## 2019-03-14 ENCOUNTER — Telehealth: Payer: Self-pay

## 2019-03-14 ENCOUNTER — Other Ambulatory Visit: Payer: Self-pay

## 2019-03-14 DIAGNOSIS — K635 Polyp of colon: Secondary | ICD-10-CM

## 2019-03-14 NOTE — Telephone Encounter (Signed)
-----   Message from Yetta Flock, MD sent at 03/13/2019  4:53 PM EST ----- Regarding: CT scan [name] can you please coordinate a CT Scan abdomen / pelvis for this patient for large cecal polyp noted on colonoscopy today. Thanks

## 2019-03-14 NOTE — Telephone Encounter (Signed)
Scheduled CT-abdomin/pelvis w/contrast at [facility] on 03/28/19. Patient to arrive at 8:15am and be NPO 4 hours before except for the contrast. Patient already has contrast. To drink 1st bottle at 6:30am and 2nd bottle at 7:30am

## 2019-03-15 ENCOUNTER — Telehealth: Payer: Self-pay | Admitting: *Deleted

## 2019-03-15 NOTE — Telephone Encounter (Signed)
  Follow up Call-  Call back number 03/13/2019  Post procedure Call Back phone  # [phone]  Permission to leave phone message Yes  Some recent data might be hidden     Patient questions:  Do you have a fever, pain , or abdominal swelling? No. Pain Score  0 *  Have you tolerated food without any problems? Yes.    Have you been able to return to your normal activities? Yes.    Do you have any questions about your discharge instructions: Diet   No. Medications  No. Follow up visit  No.  Do you have questions or concerns about your Care? No.  Actions: * If pain score is 4 or above: No action needed, pain <4.

## 2019-03-15 NOTE — Telephone Encounter (Signed)
  Follow up Call-  Call back number 03/13/2019  Post procedure Call Back phone  # [phone]  Permission to leave phone message Yes  Some recent data might be hidden     Patient questions:  Do you have a fever, pain , or abdominal swelling? No. Pain Score  0 *  Have you tolerated food without any problems? Yes.    Have you been able to return to your normal activities? Yes.    Do you have any questions about your discharge instructions: Diet   No. Medications  No. Follow up visit  No.  Do you have questions or concerns about your Care? No.  Actions: * If pain score is 4 or above: No action needed, pain <4.  1. Have you developed a fever since your procedure? no  2.   Have you had an respiratory symptoms (SOB or cough) since your procedure? no  3.   Have you tested positive for COVID 19 since your procedure no  4.   Have you had any family members/close contacts diagnosed with the COVID 19 since your procedure?  no   If yes to any of these questions please route to Joylene John, RN and Alphonsa Gin, Therapist, sports.

## 2019-03-22 ENCOUNTER — Encounter: Payer: Medicare Other | Admitting: Infectious Disease

## 2019-03-27 ENCOUNTER — Other Ambulatory Visit: Payer: Self-pay

## 2019-03-27 ENCOUNTER — Ambulatory Visit (INDEPENDENT_AMBULATORY_CARE_PROVIDER_SITE_OTHER): Payer: Medicare Other | Admitting: Infectious Disease

## 2019-03-27 ENCOUNTER — Encounter: Payer: Self-pay | Admitting: Infectious Disease

## 2019-03-27 VITALS — BP 136/83 | HR 73 | Temp 97.9°F | Wt 186.0 lb

## 2019-03-27 DIAGNOSIS — F17200 Nicotine dependence, unspecified, uncomplicated: Secondary | ICD-10-CM

## 2019-03-27 DIAGNOSIS — Z79899 Other long term (current) drug therapy: Secondary | ICD-10-CM

## 2019-03-27 DIAGNOSIS — B20 Human immunodeficiency virus [HIV] disease: Secondary | ICD-10-CM

## 2019-03-27 DIAGNOSIS — I10 Essential (primary) hypertension: Secondary | ICD-10-CM | POA: Diagnosis not present

## 2019-03-27 DIAGNOSIS — N529 Male erectile dysfunction, unspecified: Secondary | ICD-10-CM | POA: Diagnosis not present

## 2019-03-27 MED ORDER — GENVOYA 150-150-200-10 MG PO TABS: ORAL_TABLET | ORAL | 11 refills | Status: DC

## 2019-03-27 NOTE — Progress Notes (Signed)
Subjective:   Chief complaint: followup for his HIV on medications.,  Still concerned about syphilis titer being 1-4  Patient ID: [name], male    DOB: [birth year], 69 y.o.   MRN: [id number]  HPI  69 -year-old Caucasian male with HIV that has been superbly well controlled on Atripla --> STRIBILD-->  GENVOYA returns for routine followup.  He has continued near perfect virological control and immune system is doing well.  Was diagnosed with syphilis which we treated with 3 doses of intramuscular benzathine penicillin.  His titers are gone down from one third 33 to 1-8. And now to 1:4  He does continue to smoke cigarettes and unfortunately after trying Chantix was not able to stop.  He remains on antihypertensive medications.        Past Medical History:  Diagnosis Date  . BPH (benign prostatic hyperplasia)   . Erectile dysfunction   . HIV infection ([place])   . Pruritic rash 04/21/2015  . Secondary polycythemia 05/26/2016  . Syphilis 04/19/2018    Past Surgical History:  Procedure Laterality Date  . PARTIAL COLECTOMY      Family History  Problem Relation Age of Onset  . Heart failure Mother   . Stroke Father   . Colon cancer Neg Hx   . Esophageal cancer Neg Hx   . Stomach cancer Neg Hx   . Rectal cancer Neg Hx       Social History   Socioeconomic History  . Marital status: Single    Spouse name: Not on file  . Number of children: Not on file  . Years of education: Not on file  . Highest education level: Not on file  Occupational History  . Not on file  Tobacco Use  . Smoking status: Current Every Day Smoker    Packs/day: 1.00    Types: Cigarettes    Start date: 08/01/2016  . Smokeless tobacco: Never Used  Substance and Sexual Activity  . Alcohol use: Yes    Alcohol/week: 0.0 standard drinks    Comment: social  . Drug use: No  . Sexual activity: Never    Comment: given condoms  Other Topics Concern  . Not on file  Social History Narrative  .  Not on file   Social Determinants of Health   Financial Resource Strain:   . Difficulty of Paying Living Expenses: Not on file  Food Insecurity:   . Worried About Charity fundraiser in the Last Year: Not on file  . Ran Out of Food in the Last Year: Not on file  Transportation Needs:   . Lack of Transportation (Medical): Not on file  . Lack of Transportation (Non-Medical): Not on file  Physical Activity:   . Days of Exercise per Week: Not on file  . Minutes of Exercise per Session: Not on file  Stress:   . Feeling of Stress : Not on file  Social Connections:   . Frequency of Communication with Friends and Family: Not on file  . Frequency of Social Gatherings with Friends and Family: Not on file  . Attends Religious Services: Not on file  . Active Member of Clubs or Organizations: Not on file  . Attends Archivist Meetings: Not on file  . Marital Status: Not on file    No Known Allergies   Current Outpatient Medications:  .  amLODipine (NORVASC) 5 MG tablet, TAKE 1 TABLET(5 MG) BY MOUTH DAILY, Disp: 90 tablet, Rfl: 1 .  GENVOYA  150-150-200-10 MG TABS tablet, TAKE 1 TABLET BY MOUTH EVERY DAY WITH BREAKFAST, Disp: 30 tablet, Rfl: 5    Review of Systems  Constitutional: Negative for activity change, appetite change, chills, diaphoresis, fatigue, fever and unexpected weight change.  HENT: Negative for postnasal drip, sinus pressure and trouble swallowing.   Eyes: Negative for photophobia, discharge, itching and visual disturbance.  Respiratory: Negative for chest tightness, shortness of breath and stridor.   Cardiovascular: Negative for palpitations and leg swelling.  Gastrointestinal: Negative for abdominal distention, anal bleeding, blood in stool and constipation.  Genitourinary: Negative for difficulty urinating, flank pain and hematuria.  Musculoskeletal: Negative for arthralgias, back pain, gait problem, joint swelling and myalgias.  Skin: Negative for color  change, pallor and wound.  Neurological: Negative for dizziness, tremors, weakness and light-headedness.  Hematological: Negative for adenopathy. Does not bruise/bleed easily.  Psychiatric/Behavioral: Negative for agitation, behavioral problems, confusion, decreased concentration, dysphoric Blackwell, self-injury and sleep disturbance. The patient is not hyperactive.        Objective:   Physical Exam  Constitutional: He is oriented to person, place, and time. He appears well-developed and well-nourished. No distress.  HENT:  Head: Normocephalic and atraumatic.  Mouth/Throat: Uvula is midline.  Eyes: Conjunctivae and EOM are normal. No scleral icterus.  Neck: No JVD present. No tracheal deviation present.  Cardiovascular: Normal rate and regular rhythm.  Pulmonary/Chest: Effort normal. No respiratory distress. He has no wheezes.  Abdominal: He exhibits no distension.  Musculoskeletal:        General: No tenderness or edema. Normal range of motion.     Cervical back: Normal range of motion and neck supple.  Lymphadenopathy:    He has no cervical adenopathy.  Neurological: He is alert and oriented to person, place, and time. He has normal reflexes. He exhibits normal muscle tone. Coordination normal.  Skin: Skin is warm and dry. He is not diaphoretic. No erythema. No pallor.  Psychiatric: He has a normal Blackwell and affect. His behavior is normal. Judgment and thought content normal.          Assessment & Plan:   HIV: Continue Genvoya for now but consider switching to [place] in the future.  He would like to do that next year.  He is going to renew his SPAP this summer but will see me in 1 years time.  Erectile dysfunction can continue his Viagra as needed but cautiously dose based on renal function  Syphilis: Have diminished sufficiently we will recheck again we will see him in January  Smoking: Have urged him to stop smoking we will continue to work on this  Hypertension on  amlodipine.  I spent greater than 25 minutes with the patient including greater than 50% of time in face to face counsel of the patient the nature of his labs regarding how we monitor response to syphilis regarding his antiretroviral regimen and different components of this and different alternate regimens we could switch to and coordination of his care.

## 2019-03-28 ENCOUNTER — Encounter (HOSPITAL_COMMUNITY): Payer: Self-pay | Admitting: Radiology

## 2019-03-28 ENCOUNTER — Ambulatory Visit (HOSPITAL_COMMUNITY)
Admission: RE | Admit: 2019-03-28 | Discharge: 2019-03-28 | Disposition: A | Discharge status: Home / Self Care | Payer: Medicare Other | Source: Ambulatory Visit | Attending: Gastroenterology | Admitting: Gastroenterology

## 2019-03-28 DIAGNOSIS — K635 Polyp of colon: Secondary | ICD-10-CM | POA: Insufficient documentation

## 2019-03-28 MED ORDER — IOHEXOL 300 MG/ML  SOLN
100.0000 mL | Freq: Once | INTRAMUSCULAR | Status: AC | PRN
  Administered 2019-03-28: 100 mL via INTRAVENOUS

## 2019-03-28 MED ORDER — SODIUM CHLORIDE (PF) 0.9 % IJ SOLN
INTRAMUSCULAR | Status: AC
  Filled 2019-03-28: qty 50

## 2019-03-29 ENCOUNTER — Encounter: Payer: Self-pay | Admitting: Infectious Disease

## 2019-04-02 ENCOUNTER — Other Ambulatory Visit: Payer: Self-pay | Admitting: Gastroenterology

## 2019-04-02 DIAGNOSIS — K7460 Unspecified cirrhosis of liver: Secondary | ICD-10-CM

## 2019-04-02 DIAGNOSIS — R932 Abnormal findings on diagnostic imaging of liver and biliary tract: Secondary | ICD-10-CM

## 2019-04-03 ENCOUNTER — Telehealth: Payer: Self-pay

## 2019-04-03 NOTE — Telephone Encounter (Signed)
-----   Message from Irving Copas., MD sent at 04/03/2019 10:01 AM EST ----- Regarding: RE: possible polypectomy SA, Thanks for update. I did review the imaging as well. Omri Bertran, please offer patient a Clinic appointment and work on a Colonoscopy with EMR slot. Please let SA and I know the date of clinic and procedure. Thanks. GM ----- Message ----- From: Yetta Flock, MD Sent: 04/02/2019  12:51 PM EST To: Timothy Lasso, RN, Irving Copas., MD Subject: RE: possible polypectomy                       Thanks Chester Holstein, I had a good conversation with him about it today. He really wants to avoid surgery if at all possible if this is amenable to colonoscopy. He'd like to proceed with you if that's okay. I have outlined risks with him and he understands. Not sure if you want to direct book this at the hospital or would like to see him in the clinic first to discuss. If it doesn't lift well or invades the AO he understands that he will need surgery in that setting. Thanks  Richardson Landry  ----- Message ----- From: Irving Copas., MD Sent: 03/31/2019   4:56 AM EST To: Timothy Lasso, RN, Yetta Flock, MD Subject: RE: possible polypectomy                       Richardson Landry, I think it is worth an attempt and look with intention of removing in the hospital.  If it is involving his appendix, I would probably leave it alone, but may not know until we try and lift it. I think, having a referral to Surgery to also discuss whether he would want this done and not worry about repeat Colonoscopies for surveillance is reasonable also since this could be just an extended Appendectomy/Cecectomy. Just let Brynlei Klausner and I know what he would like and we will get him in. Thanks. GM ----- Message ----- From: Yetta Flock, MD Sent: 03/28/2019   1:16 PM EST To: Irving Copas., MD Subject: possible polypectomy                           Sloan Leiter,  This is the patient who had the large  cecal polyp I had you come into the room when I saw it at the [facility]. Path shows TVA with HGD.  CT done, no concerning lymphadenopathy and appendix looks okay which is good. Can you let me know what you think, would you be willing to consider removing it at the hospital or should I refer him to surgery?   Thanks for your opinion. Richardson Landry

## 2019-04-03 NOTE — Telephone Encounter (Signed)
No answer no voice mail  

## 2019-04-03 NOTE — Telephone Encounter (Signed)
The pt has been scheduled to see Dr Rush Landmark on 4/22 to discuss colon prior to setting up appt per his  preference.  He will call with any concerns prior to that appt.

## 2019-04-07 ENCOUNTER — Other Ambulatory Visit: Payer: Self-pay | Admitting: Infectious Disease

## 2019-04-07 DIAGNOSIS — B20 Human immunodeficiency virus [HIV] disease: Secondary | ICD-10-CM

## 2019-05-24 ENCOUNTER — Ambulatory Visit (INDEPENDENT_AMBULATORY_CARE_PROVIDER_SITE_OTHER): Payer: Medicare Other | Admitting: Gastroenterology

## 2019-05-24 ENCOUNTER — Telehealth: Payer: Self-pay

## 2019-05-24 ENCOUNTER — Other Ambulatory Visit (INDEPENDENT_AMBULATORY_CARE_PROVIDER_SITE_OTHER): Payer: Medicare Other

## 2019-05-24 ENCOUNTER — Encounter: Payer: Self-pay | Admitting: Gastroenterology

## 2019-05-24 VITALS — BP 138/80 | HR 67 | Temp 98.6°F | Ht 70.0 in | Wt 177.0 lb

## 2019-05-24 DIAGNOSIS — Z86010 Personal history of colonic polyps: Secondary | ICD-10-CM

## 2019-05-24 DIAGNOSIS — R933 Abnormal findings on diagnostic imaging of other parts of digestive tract: Secondary | ICD-10-CM

## 2019-05-24 DIAGNOSIS — K635 Polyp of colon: Secondary | ICD-10-CM

## 2019-05-24 LAB — CBC
HCT: 50.4 % (ref 39.0–52.0)
Hemoglobin: 17.4 g/dL — ABNORMAL HIGH (ref 13.0–17.0)
MCHC: 34.6 g/dL (ref 30.0–36.0)
MCV: 97 fl (ref 78.0–100.0)
Platelets: 250 10*3/uL (ref 150.0–400.0)
RBC: 5.19 Mil/uL (ref 4.22–5.81)
RDW: 13.3 % (ref 11.5–15.5)
WBC: 6.6 10*3/uL (ref 4.0–10.5)

## 2019-05-24 LAB — PROTIME-INR
INR: 1.1 ratio — ABNORMAL HIGH (ref 0.8–1.0)
Prothrombin Time: 12 s (ref 9.6–13.1)

## 2019-05-24 LAB — BASIC METABOLIC PANEL
BUN: 13 mg/dL (ref 6–23)
CO2: 26 mEq/L (ref 19–32)
Calcium: 10 mg/dL (ref 8.4–10.5)
Chloride: 103 mEq/L (ref 96–112)
Creatinine, Ser: 1.14 mg/dL (ref 0.40–1.50)
GFR: 63.98 mL/min (ref >60.00)
Glucose, Bld: 102 mg/dL — ABNORMAL HIGH (ref 70–99)
Potassium: 4.5 mEq/L (ref 3.5–5.1)
Sodium: 136 mEq/L (ref 135–145)

## 2019-05-24 NOTE — Telephone Encounter (Signed)
-----   Message from Wyline Beady, [place] sent at 05/24/2019  9:50 AM EDT ----- [name]!   Dr Jerilynn Mages told this man to call back because he forgot his calendar and also needed to check with his care partner. He's gonna call tomorrow or Monday to schedule. Dr Jerilynn Mages said colon EMR for 2 hours.    Thanks,  [employer]

## 2019-05-24 NOTE — Patient Instructions (Signed)
Call back to let us know when you would like to schedule your procedure. Ask for Chong Sicilian, RN.   Your provider has requested that you go to the basement level for lab work before leaving today. Press "B" on the elevator. The lab is located at the first door on the left as you exit the elevator.  I value your feedback and thank you for entrusting [place] with your care. If you get a [facility] patient survey, I would appreciate you taking the time to let us know about your experience today. Thank you!   Due to recent changes in healthcare laws, you may see the results of your imaging and laboratory studies on MyChart before your provider has had a chance to review them.  We understand that in some cases there may be results that are confusing or concerning to you. Not all laboratory results come back in the same time frame and the provider may be waiting for multiple results in order to interpret others.  Please give [place] 48 hours in order for your provider to thoroughly review all the results before contacting the office for clarification of your results.    Thank you for trusting me with your gastrointestinal care!    Justice Britain, MD

## 2019-05-25 ENCOUNTER — Other Ambulatory Visit: Payer: Self-pay

## 2019-05-25 ENCOUNTER — Encounter: Payer: Self-pay | Admitting: Gastroenterology

## 2019-05-25 ENCOUNTER — Telehealth: Payer: Self-pay | Admitting: Gastroenterology

## 2019-05-25 DIAGNOSIS — K635 Polyp of colon: Secondary | ICD-10-CM

## 2019-05-25 NOTE — Telephone Encounter (Signed)
Pt would like to schedule his colonoscopy with Dr. Rush Landmark at the hospital. Pls call him.

## 2019-05-25 NOTE — Telephone Encounter (Signed)
Dr Jerilynn Mages told this man to call back because he forgot his calendar and also needed to check with his care partner. He's gonna call tomorrow or Monday to schedule. Dr Jerilynn Mages said colon EMR for 2 hours.

## 2019-05-25 NOTE — H&P (View-Only) (Signed)
[name]ISIT   Primary Care Provider Wendie Agreste, MD [address] [place] [id number] [phone]  Referring Provider Dr. Havery Moros  Patient Profile: [name] is a 69 y.o. male with a pmh significant for hypertension, HIV, BPH, nephrolithiasis, diverticulosis (status post partial sigmoid resection in setting of prior diverticulitis in 2006), colon polyps.  The patient presents to the [facility] for an evaluation and management of problem(s) noted below:  Problem List 1. Cecal polyp   2. History of colonic polyps   3. Abnormal CT scan, colon     History of Present Illness The patient recently underwent a colonoscopy by Dr. Havery Moros for colon cancer screening and reported history of colon polyps although no prior colonoscopy has been noted in the system.  He was found to have a large cecal/?appendiceal villous adenoma with high-grade dysplasia.  The patient underwent a cross-sectional CT abdomen/pelvis which showed a 3.5 cm mass but no invasion or issues within the appendix itself.  It is for this reason that the patient is referred for potential endoscopic management options of this lesion.  Looking back at the patient's history in 2006, the patient had recurrent episodes of diverticulitis eventually leading to a segmental resection by Dr. Marlou Starks.  It is not clear that the patient had a colonoscopy after that procedure but there are no records and the patient believes he may have.  The patient describes no significant GI symptoms or issues.  Patient has normal bowel habits.  No blood in his stools.  He does not take any anticoagulants.  GI Review of Systems Positive as above Negative for pyrosis, dysphagia, odynophagia, nausea, vomiting, pain  Review of Systems General: Denies fevers/chills/weight loss HEENT: Denies oral lesions Cardiovascular: Denies chest pain Pulmonary: Denies shortness of breath  Gastroenterological: See HPI Genitourinary: Denies darkened urine Hematological: Denies easy bruising/bleeding Dermatological: Denies jaundice Psychological: Mood is stable   Medications Current Outpatient Medications  Medication Sig Dispense Refill  . amLODipine (NORVASC) 5 MG tablet TAKE 1 TABLET(5 MG) BY MOUTH DAILY 90 tablet 1  . elvitegravir-cobicistat-emtricitabine-tenofovir (GENVOYA) 150-150-200-10 MG TABS tablet TAKE 1 TABLET BY MOUTH EVERY DAY WITH BREAKFAST 30 tablet 11   No current facility-administered medications for this visit.    Allergies No Known Allergies  Histories Past Medical History:  Diagnosis Date  . BPH (benign prostatic hyperplasia)   . Erectile dysfunction   . HIV infection ([place])   . Pruritic rash 04/21/2015  . Secondary polycythemia 05/26/2016  . Syphilis 04/19/2018   Past Surgical History:  Procedure Laterality Date  . COLONOSCOPY    . PARTIAL COLECTOMY     Social History   Socioeconomic History  . Marital status: Single    Spouse name: Not on file  . Number of children: 0  . Years of education: Not on file  . Highest education level: Not on file  Occupational History  . Occupation: retired  Tobacco Use  . Smoking status: Current Every Day Smoker    Packs/day: 1.00    Types: Cigarettes    Start date: 08/01/2016  . Smokeless tobacco: Never Used  . Tobacco comment: tobacco info given  Substance and Sexual Activity  . Alcohol use: Yes    Alcohol/week: 0.0 standard drinks    Comment: social  . Drug use: No  . Sexual activity: Never    Comment: given condoms  Other Topics Concern  . Not on file  Social History Narrative  . Not on file  Social Determinants of Health   Financial Resource Strain:   . Difficulty of Paying Living Expenses:   Food Insecurity:   . Worried About Charity fundraiser in the Last Year:   . Arboriculturist in the Last Year:   Transportation Needs:   . Film/video editor (Medical):   Marland Kitchen Lack of  Transportation (Non-Medical):   Physical Activity:   . Days of Exercise per Week:   . Minutes of Exercise per Session:   Stress:   . Feeling of Stress :   Social Connections:   . Frequency of Communication with Friends and Family:   . Frequency of Social Gatherings with Friends and Family:   . Attends Religious Services:   . Active Member of Clubs or Organizations:   . Attends Archivist Meetings:   Marland Kitchen Marital Status:   Intimate Partner Violence:   . Fear of Current or Ex-Partner:   . Emotionally Abused:   Marland Kitchen Physically Abused:   . Sexually Abused:    Family History  Problem Relation Age of Onset  . Heart failure Mother   . Stroke Father   . Colon cancer Neg Hx   . Esophageal cancer Neg Hx   . Stomach cancer Neg Hx   . Rectal cancer Neg Hx   . Inflammatory bowel disease Neg Hx   . Liver disease Neg Hx   . Pancreatic cancer Neg Hx    I have reviewed his medical, social, and family history in detail and updated the electronic medical record as necessary.    PHYSICAL EXAMINATION  BP 138/80   Pulse 67   Temp 98.6 F (37 C)   Ht 5\' 10"  (1.778 m)   Wt 177 lb (80.3 kg)   BMI 25.40 kg/m  Wt Readings from Last 3 Encounters:  05/24/19 177 lb (80.3 kg)  03/27/19 186 lb (84.4 kg)  03/13/19 186 lb (84.4 kg)  GEN: NAD, appears stated age, doesn't appear chronically ill PSYCH: Cooperative, without pressured speech EYE: Conjunctivae pink, sclerae anicteric ENT: MMM, without oral ulcers, no erythema or exudates noted CV: Nontachycardic RESP: No audible wheezing GI: NABS, soft, mildly protuberant, surgical scars present, nontender, without rebound or guarding MSK/EXT: No significant lower extremity edema SKIN: No jaundice NEURO:  Alert & Oriented x 3, no focal deficits   REVIEW OF DATA  I reviewed the following data at the time of this encounter:  GI Procedures and Studies  February 2021 colonoscopy - The examined portion of the ileum was normal. - Polypoid  lesion in the cecum overlying the appendiceal orifice as outlined. Biopsied. - Diverticulosis in the sigmoid colon and in the cecum. - Internal hemorrhoids. - The examination was otherwise normal. Pathology Surgical [P], colon, cecum polyp/polypoid lesion - SUPERFICIAL FRAGMENTS OF TUBULOVILLOUS ADENOMA WITH FOCAL HIGH-GRADE DYSPLASIA - NEGATIVE FOR CARCINOMA  Laboratory Studies  Reviewed those in epic  Imaging Studies  February 2021 CT abdomen pelvis with contrast IMPRESSION: 1. 3.5 cm cecal mass, corresponding to the colonoscopy abnormality. No pericolonic extension or adjacent adenopathy. 2. Hepatic morphology which is suspicious for mild cirrhosis. Recannulized paraumbilical vein, suggesting portal venous hypertension. 3.  Aortic Atherosclerosis (ICD10-I70.0). 4. Mild prostatomegaly.   ASSESSMENT  Mr. Trussell is a 69 y.o. male  with a pmh significant for hypertension, HIV, BPH, nephrolithiasis, diverticulosis (status post partial sigmoid resection in setting of prior diverticulitis in 2006), colon polyps.  The patient is seen today for evaluation and management of:  1. Cecal polyp  2. History of colonic polyps   3. Abnormal CT scan, colon    Patient is clinically and hemodynamically stable.  Based upon the description and endoscopic pictures I do feel that it is reasonable to pursue an Advanced Polypectomy attempt of the polyp/lesion.  It is not clear that the lesion itself is involving the appendiceal orifice although we cannot see it currently there does look to be a stalk-like area of the lesion so potentially it could be semipedunculated.  We discussed some of the techniques of advanced polypectomy which include Endoscopic Mucosal Resection, OVESCO Full-Thickness Resection, Endorotor Morcellation, and Tissue Ablation via Fulguration.  If no clear evidence of seeing the appendiceal orifice, then risks of endoscopic resection may outweigh potential surgical intervention.  Although,  patient very hesitant about any type of surgery being performed.  The risks and benefits of endoscopic evaluation were discussed with the patient; these include but are not limited to the risk of perforation, infection, bleeding, missed lesions, lack of diagnosis, severe illness requiring hospitalization, as well as anesthesia and sedation related illnesses.  During attempts at advanced resection, the risks of bleeding and perforation/leak are increased as opposed to diagnostic and screening procedures, and that was discussed with the patient as well.   In addition, I explained that with the possible need for piecemeal resection, subsequent short-interval endoscopic evaluation for follow up and potential retreatment of the lesion/area may be necessary.  I did offer, a referral to surgery in order for patient to have opportunity to discuss surgical management/intervention prior to finalizing decision for attempt at endoscopic removal, however, the patient deferred on this and he is not sure that he would even undergo surgery if it cannot be removed endoscopically..  If, after attempt at removal of the polyp/lesion, it is found that the patient has a complication or that an invasive lesion or malignant lesion is found, or that the polyp/lesion continues to recur, the patient is aware and understands that surgery may still be indicated/required but he hopes this is not the case.  All patient questions were answered, to the best of my ability, and the patient agrees to the aforementioned plan of action with follow-up as indicated.   PLAN  Laboratories as outlined below Proceed with scheduling colonoscopy with EMR   Orders Placed This Encounter  Procedures  . CBC  . Basic Metabolic Panel (BMET)  . INR/PT    New Prescriptions   No medications on file   Modified Medications   No medications on file    Planned Follow Up No follow-ups on file.   Total Time in Face-to-Face and in Coordination of Care  for patient including independent/personal interpretation/review of prior testing, medical history, examination, medication adjustment, communicating results with the patient directly, and documentation with the EHR is 30 minutes.   Justice Britain, MD [facility] Advanced [facility]oscopy Office # [id number]

## 2019-05-25 NOTE — Progress Notes (Signed)
[place] VISIT   Primary Care Provider Wendie Agreste, MD [address] [place] [id number] [phone]  Referring Provider Dr. Havery Moros  Patient Profile: [name] is a 69 y.o. male with a pmh significant for hypertension, HIV, BPH, nephrolithiasis, diverticulosis (status post partial sigmoid resection in setting of prior diverticulitis in 2006), colon polyps.  The patient presents to the [facility] for an evaluation and management of problem(s) noted below:  Problem List 1. Cecal polyp   2. History of colonic polyps   3. Abnormal CT scan, colon     History of Present Illness The patient recently underwent a colonoscopy by Dr. Havery Moros for colon cancer screening and reported history of colon polyps although no prior colonoscopy has been noted in the system.  He was found to have a large cecal/?appendiceal villous adenoma with high-grade dysplasia.  The patient underwent a cross-sectional CT abdomen/pelvis which showed a 3.5 cm mass but no invasion or issues within the appendix itself.  It is for this reason that the patient is referred for potential endoscopic management options of this lesion.  Looking back at the patient's history in 2006, the patient had recurrent episodes of diverticulitis eventually leading to a segmental resection by Dr. Marlou Starks.  It is not clear that the patient had a colonoscopy after that procedure but there are no records and the patient believes he may have.  The patient describes no significant GI symptoms or issues.  Patient has normal bowel habits.  No blood in his stools.  He does not take any anticoagulants.  GI Review of Systems Positive as above Negative for pyrosis, dysphagia, odynophagia, nausea, vomiting, pain  Review of Systems General: Denies fevers/chills/weight loss HEENT: Denies oral lesions Cardiovascular: Denies chest pain Pulmonary: Denies shortness of  breath Gastroenterological: See HPI Genitourinary: Denies darkened urine Hematological: Denies easy bruising/bleeding Dermatological: Denies jaundice Psychological: Mood is stable   Medications Current Outpatient Medications  Medication Sig Dispense Refill  . amLODipine (NORVASC) 5 MG tablet TAKE 1 TABLET(5 MG) BY MOUTH DAILY 90 tablet 1  . elvitegravir-cobicistat-emtricitabine-tenofovir (GENVOYA) 150-150-200-10 MG TABS tablet TAKE 1 TABLET BY MOUTH EVERY DAY WITH BREAKFAST 30 tablet 11   No current facility-administered medications for this visit.    Allergies No Known Allergies  Histories Past Medical History:  Diagnosis Date  . BPH (benign prostatic hyperplasia)   . Erectile dysfunction   . HIV infection ([place])   . Pruritic rash 04/21/2015  . Secondary polycythemia 05/26/2016  . Syphilis 04/19/2018   Past Surgical History:  Procedure Laterality Date  . COLONOSCOPY    . PARTIAL COLECTOMY     Social History   Socioeconomic History  . Marital status: Single    Spouse name: Not on file  . Number of children: 0  . Years of education: Not on file  . Highest education level: Not on file  Occupational History  . Occupation: retired  Tobacco Use  . Smoking status: Current Every Day Smoker    Packs/day: 1.00    Types: Cigarettes    Start date: 08/01/2016  . Smokeless tobacco: Never Used  . Tobacco comment: tobacco info given  Substance and Sexual Activity  . Alcohol use: Yes    Alcohol/week: 0.0 standard drinks    Comment: social  . Drug use: No  . Sexual activity: Never    Comment: given condoms  Other Topics Concern  . Not on file  Social History Narrative  . Not on file  Social Determinants of Health   Financial Resource Strain:   . Difficulty of Paying Living Expenses:   Food Insecurity:   . Worried About Charity fundraiser in the Last Year:   . Arboriculturist in the Last Year:   Transportation Needs:   . Film/video editor (Medical):   Marland Kitchen Lack of  Transportation (Non-Medical):   Physical Activity:   . Days of Exercise per Week:   . Minutes of Exercise per Session:   Stress:   . Feeling of Stress :   Social Connections:   . Frequency of Communication with Friends and Family:   . Frequency of Social Gatherings with Friends and Family:   . Attends Religious Services:   . Active Member of Clubs or Organizations:   . Attends Archivist Meetings:   Marland Kitchen Marital Status:   Intimate Partner Violence:   . Fear of Current or Ex-Partner:   . Emotionally Abused:   Marland Kitchen Physically Abused:   . Sexually Abused:    Family History  Problem Relation Age of Onset  . Heart failure Mother   . Stroke Father   . Colon cancer Neg Hx   . Esophageal cancer Neg Hx   . Stomach cancer Neg Hx   . Rectal cancer Neg Hx   . Inflammatory bowel disease Neg Hx   . Liver disease Neg Hx   . Pancreatic cancer Neg Hx    I have reviewed his medical, social, and family history in detail and updated the electronic medical record as necessary.    PHYSICAL EXAMINATION  BP 138/80   Pulse 67   Temp 98.6 F (37 C)   Ht 5\' 10"  (1.778 m)   Wt 177 lb (80.3 kg)   BMI 25.40 kg/m  Wt Readings from Last 3 Encounters:  05/24/19 177 lb (80.3 kg)  03/27/19 186 lb (84.4 kg)  03/13/19 186 lb (84.4 kg)  GEN: NAD, appears stated age, doesn't appear chronically ill PSYCH: Cooperative, without pressured speech EYE: Conjunctivae pink, sclerae anicteric ENT: MMM, without oral ulcers, no erythema or exudates noted CV: Nontachycardic RESP: No audible wheezing GI: NABS, soft, mildly protuberant, surgical scars present, nontender, without rebound or guarding MSK/EXT: No significant lower extremity edema SKIN: No jaundice NEURO:  Alert & Oriented x 3, no focal deficits   REVIEW OF DATA  I reviewed the following data at the time of this encounter:  GI Procedures and Studies  February 2021 colonoscopy - The examined portion of the ileum was normal. - Polypoid  lesion in the cecum overlying the appendiceal orifice as outlined. Biopsied. - Diverticulosis in the sigmoid colon and in the cecum. - Internal hemorrhoids. - The examination was otherwise normal. Pathology Surgical [P], colon, cecum polyp/polypoid lesion - SUPERFICIAL FRAGMENTS OF TUBULOVILLOUS ADENOMA WITH FOCAL HIGH-GRADE DYSPLASIA - NEGATIVE FOR CARCINOMA  Laboratory Studies  Reviewed those in epic  Imaging Studies  February 2021 CT abdomen pelvis with contrast IMPRESSION: 1. 3.5 cm cecal mass, corresponding to the colonoscopy abnormality. No pericolonic extension or adjacent adenopathy. 2. Hepatic morphology which is suspicious for mild cirrhosis. Recannulized paraumbilical vein, suggesting portal venous hypertension. 3.  Aortic Atherosclerosis (ICD10-I70.0). 4. Mild prostatomegaly.   ASSESSMENT  Mr. Dubel is a 69 y.o. male  with a pmh significant for hypertension, HIV, BPH, nephrolithiasis, diverticulosis (status post partial sigmoid resection in setting of prior diverticulitis in 2006), colon polyps.  The patient is seen today for evaluation and management of:  1. Cecal polyp  2. History of colonic polyps   3. Abnormal CT scan, colon    Patient is clinically and hemodynamically stable.  Based upon the description and endoscopic pictures I do feel that it is reasonable to pursue an Advanced Polypectomy attempt of the polyp/lesion.  It is not clear that the lesion itself is involving the appendiceal orifice although we cannot see it currently there does look to be a stalk-like area of the lesion so potentially it could be semipedunculated.  We discussed some of the techniques of advanced polypectomy which include Endoscopic Mucosal Resection, OVESCO Full-Thickness Resection, Endorotor Morcellation, and Tissue Ablation via Fulguration.  If no clear evidence of seeing the appendiceal orifice, then risks of endoscopic resection may outweigh potential surgical intervention.  Although,  patient very hesitant about any type of surgery being performed.  The risks and benefits of endoscopic evaluation were discussed with the patient; these include but are not limited to the risk of perforation, infection, bleeding, missed lesions, lack of diagnosis, severe illness requiring hospitalization, as well as anesthesia and sedation related illnesses.  During attempts at advanced resection, the risks of bleeding and perforation/leak are increased as opposed to diagnostic and screening procedures, and that was discussed with the patient as well.   In addition, I explained that with the possible need for piecemeal resection, subsequent short-interval endoscopic evaluation for follow up and potential retreatment of the lesion/area may be necessary.  I did offer, a referral to surgery in order for patient to have opportunity to discuss surgical management/intervention prior to finalizing decision for attempt at endoscopic removal, however, the patient deferred on this and he is not sure that he would even undergo surgery if it cannot be removed endoscopically..  If, after attempt at removal of the polyp/lesion, it is found that the patient has a complication or that an invasive lesion or malignant lesion is found, or that the polyp/lesion continues to recur, the patient is aware and understands that surgery may still be indicated/required but he hopes this is not the case.  All patient questions were answered, to the best of my ability, and the patient agrees to the aforementioned plan of action with follow-up as indicated.   PLAN  Laboratories as outlined below Proceed with scheduling colonoscopy with EMR   Orders Placed This Encounter  Procedures  . CBC  . Basic Metabolic Panel (BMET)  . INR/PT    New Prescriptions   No medications on file   Modified Medications   No medications on file    Planned Follow Up No follow-ups on file.   Total Time in Face-to-Face and in Coordination of Care  for patient including independent/personal interpretation/review of prior testing, medical history, examination, medication adjustment, communicating results with the patient directly, and documentation with the EHR is 30 minutes.   Justice Britain, MD [facility] Office # [phone]

## 2019-05-25 NOTE — Telephone Encounter (Signed)
Colon EMR scheduled with Dr Rush Landmark on 06/13/19 at [facility] 1030 am.  COVID test on 06/09/19 at 1050 am Left message on machine to call back

## 2019-05-26 ENCOUNTER — Encounter: Payer: Self-pay | Admitting: Gastroenterology

## 2019-05-26 DIAGNOSIS — Z860100 Personal history of colon polyps, unspecified: Secondary | ICD-10-CM | POA: Insufficient documentation

## 2019-05-26 DIAGNOSIS — R933 Abnormal findings on diagnostic imaging of other parts of digestive tract: Secondary | ICD-10-CM | POA: Insufficient documentation

## 2019-05-26 DIAGNOSIS — K635 Polyp of colon: Secondary | ICD-10-CM | POA: Insufficient documentation

## 2019-05-28 NOTE — Telephone Encounter (Signed)
Left message on machine to call back  

## 2019-05-29 NOTE — Telephone Encounter (Signed)
Left message on machine to call back  Spoke with [facility] and she states that he is on vacation.  I will try at a later time to reach. Information sent to the pt via My Chart.  I did see that he reads his messages so hopefully he will see and respond.

## 2019-06-09 ENCOUNTER — Other Ambulatory Visit (HOSPITAL_COMMUNITY)
Admission: RE | Admit: 2019-06-09 | Discharge: 2019-06-09 | Disposition: A | Discharge status: Home / Self Care | Payer: Medicare Other | Source: Ambulatory Visit | Attending: Gastroenterology | Admitting: Gastroenterology

## 2019-06-09 DIAGNOSIS — Z01812 Encounter for preprocedural laboratory examination: Secondary | ICD-10-CM | POA: Insufficient documentation

## 2019-06-09 DIAGNOSIS — Z20822 Contact with and (suspected) exposure to covid-19: Secondary | ICD-10-CM | POA: Insufficient documentation

## 2019-06-09 LAB — SARS CORONAVIRUS 2 (TAT 6-24 HRS): SARS Coronavirus 2: NEGATIVE

## 2019-06-12 ENCOUNTER — Encounter (HOSPITAL_COMMUNITY): Payer: Self-pay | Admitting: Gastroenterology

## 2019-06-12 ENCOUNTER — Other Ambulatory Visit: Payer: Self-pay

## 2019-06-12 NOTE — Progress Notes (Signed)
Spoke with pt for pre-op call. Pt has hx of HTN, denies cardiac history or Diabetes.  Covid test done on 06/09/19 and it's negative. Pt states he's been in quarantine since the test was done and understands that he stays in quarantine until he comes tomorrow to the hospital.  Pt will need an EKG

## 2019-06-12 NOTE — Anesthesia Preprocedure Evaluation (Addendum)
Anesthesia Evaluation  Patient identified by MRN, date of birth, ID band Patient awake    Reviewed: Allergy & Precautions, NPO status , Patient's Chart, lab work & pertinent test results  Airway Mallampati: II  TM Distance: >3 FB Neck ROM: Full    Dental no notable dental hx. (+) Teeth Intact, Dental Advisory Given   Pulmonary neg pulmonary ROS, Current SmokerPatient did not abstain from smoking.,    Pulmonary exam normal breath sounds clear to auscultation       Cardiovascular hypertension, Pt. on medications Normal cardiovascular exam Rhythm:Regular Rate:Normal     Neuro/Psych negative neurological ROS  negative psych ROS   GI/Hepatic negative GI ROS, Neg liver ROS,   Endo/Other  negative endocrine ROS  Renal/GU Renal disease  negative genitourinary   Musculoskeletal negative musculoskeletal ROS (+)   Abdominal   Peds  Hematology  (+) HIV,   Anesthesia Other Findings   Reproductive/Obstetrics                           Anesthesia Physical Anesthesia Plan  ASA: II  Anesthesia Plan: MAC   Post-op Pain Management:    Induction: Intravenous  PONV Risk Score and Plan: Treatment may vary due to age or medical condition  Airway Management Planned: Nasal Cannula, Natural Airway and Oral ETT  Additional Equipment: None  Intra-op Plan:   Post-operative Plan:   Informed Consent: I have reviewed the patients History and Physical, chart, labs and discussed the procedure including the risks, benefits and alternatives for the proposed anesthesia with the patient or authorized representative who has indicated his/her understanding and acceptance.     Dental advisory given  Plan Discussed with: CRNA  Anesthesia Plan Comments: (Pt w Hx of polyps  for colonoscopy)       Anesthesia Quick Evaluation

## 2019-06-13 ENCOUNTER — Encounter (HOSPITAL_COMMUNITY)
Admission: RE | Disposition: A | Discharge status: Home / Self Care | Payer: Self-pay | Source: Home / Self Care | Attending: Gastroenterology

## 2019-06-13 ENCOUNTER — Ambulatory Visit (HOSPITAL_COMMUNITY): Payer: Medicare Other | Admitting: Anesthesiology

## 2019-06-13 ENCOUNTER — Encounter (HOSPITAL_COMMUNITY): Payer: Self-pay | Admitting: Gastroenterology

## 2019-06-13 ENCOUNTER — Observation Stay (HOSPITAL_COMMUNITY): Payer: Medicare Other

## 2019-06-13 ENCOUNTER — Observation Stay (HOSPITAL_COMMUNITY)
Admission: RE | Admit: 2019-06-13 | Discharge: 2019-06-14 | Disposition: A | Discharge status: Home / Self Care | Payer: Medicare Other | Attending: Gastroenterology | Admitting: Gastroenterology

## 2019-06-13 DIAGNOSIS — I10 Essential (primary) hypertension: Secondary | ICD-10-CM | POA: Insufficient documentation

## 2019-06-13 DIAGNOSIS — K641 Second degree hemorrhoids: Secondary | ICD-10-CM | POA: Diagnosis not present

## 2019-06-13 DIAGNOSIS — B20 Human immunodeficiency virus [HIV] disease: Secondary | ICD-10-CM | POA: Diagnosis not present

## 2019-06-13 DIAGNOSIS — K5731 Diverticulosis of large intestine without perforation or abscess with bleeding: Principal | ICD-10-CM | POA: Insufficient documentation

## 2019-06-13 DIAGNOSIS — K922 Gastrointestinal hemorrhage, unspecified: Secondary | ICD-10-CM | POA: Diagnosis present

## 2019-06-13 DIAGNOSIS — K635 Polyp of colon: Secondary | ICD-10-CM | POA: Diagnosis not present

## 2019-06-13 DIAGNOSIS — F17210 Nicotine dependence, cigarettes, uncomplicated: Secondary | ICD-10-CM | POA: Diagnosis not present

## 2019-06-13 DIAGNOSIS — Z79899 Other long term (current) drug therapy: Secondary | ICD-10-CM | POA: Insufficient documentation

## 2019-06-13 DIAGNOSIS — D120 Benign neoplasm of cecum: Secondary | ICD-10-CM | POA: Insufficient documentation

## 2019-06-13 DIAGNOSIS — N400 Enlarged prostate without lower urinary tract symptoms: Secondary | ICD-10-CM | POA: Diagnosis not present

## 2019-06-13 DIAGNOSIS — Z8719 Personal history of other diseases of the digestive system: Secondary | ICD-10-CM | POA: Insufficient documentation

## 2019-06-13 HISTORY — PX: COLONOSCOPY WITH PROPOFOL: SHX5780

## 2019-06-13 HISTORY — PX: HEMOSTASIS CONTROL: SHX6838

## 2019-06-13 HISTORY — PX: IR EMBO ART  VEN HEMORR LYMPH EXTRAV  INC GUIDE ROADMAPPING: IMG5450

## 2019-06-13 HISTORY — PX: IR US GUIDE VASC ACCESS RIGHT: IMG2390

## 2019-06-13 HISTORY — PX: SUBMUCOSAL LIFTING INJECTION: SHX6855

## 2019-06-13 HISTORY — PX: HEMOSTASIS CLIP PLACEMENT: SHX6857

## 2019-06-13 HISTORY — PX: SCLEROTHERAPY: SHX6841

## 2019-06-13 HISTORY — PX: ENDOSCOPIC MUCOSAL RESECTION: SHX6839

## 2019-06-13 HISTORY — DX: Personal history of urinary calculi: Z87.442

## 2019-06-13 HISTORY — PX: IR ANGIOGRAM SELECTIVE EACH ADDITIONAL VESSEL: IMG667

## 2019-06-13 HISTORY — DX: Essential (primary) hypertension: I10

## 2019-06-13 HISTORY — PX: IR ANGIOGRAM VISCERAL SELECTIVE: IMG657

## 2019-06-13 LAB — COMPREHENSIVE METABOLIC PANEL
ALT: 17 U/L (ref 0–44)
AST: 21 U/L (ref 15–41)
Albumin: 3 g/dL — ABNORMAL LOW (ref 3.5–5.0)
Alkaline Phosphatase: 54 U/L (ref 38–126)
Anion gap: 11 (ref 5–15)
BUN: 7 mg/dL — ABNORMAL LOW (ref 8–23)
CO2: 22 mmol/L (ref 22–32)
Calcium: 8 mg/dL — ABNORMAL LOW (ref 8.9–10.3)
Chloride: 102 mmol/L (ref 98–111)
Creatinine, Ser: 1.09 mg/dL (ref 0.61–1.24)
GFR calc Af Amer: >60 mL/min (ref >60)
GFR calc non Af Amer: >60 mL/min (ref >60)
Glucose, Bld: 106 mg/dL — ABNORMAL HIGH (ref 70–99)
Potassium: 4.5 mmol/L (ref 3.5–5.1)
Sodium: 135 mmol/L (ref 135–145)
Total Bilirubin: 0.8 mg/dL (ref 0.3–1.2)
Total Protein: 5.4 g/dL — ABNORMAL LOW (ref 6.5–8.1)

## 2019-06-13 LAB — TYPE AND SCREEN
ABO/RH(D): A POS
Antibody Screen: NEGATIVE

## 2019-06-13 LAB — CBC
HCT: 39.9 % (ref 39.0–52.0)
Hemoglobin: 13.5 g/dL (ref 13.0–17.0)
MCH: 33.1 pg (ref 26.0–34.0)
MCHC: 33.8 g/dL (ref 30.0–36.0)
MCV: 97.8 fL (ref 80.0–100.0)
Platelets: 224 10*3/uL (ref 150–400)
RBC: 4.08 MIL/uL — ABNORMAL LOW (ref 4.22–5.81)
RDW: 12.6 % (ref 11.5–15.5)
WBC: 6.7 10*3/uL (ref 4.0–10.5)
nRBC: 0 % (ref 0.0–0.2)

## 2019-06-13 LAB — HEMOGLOBIN AND HEMATOCRIT, BLOOD
HCT: 41.2 % (ref 39.0–52.0)
HCT: 40 % (ref 39.0–52.0)
Hemoglobin: 13.8 g/dL (ref 13.0–17.0)
Hemoglobin: 13.7 g/dL (ref 13.0–17.0)

## 2019-06-13 LAB — PROTIME-INR
INR: 1.2 (ref 0.8–1.2)
Prothrombin Time: 15.2 seconds (ref 11.4–15.2)

## 2019-06-13 LAB — ABO/RH: ABO/RH(D): A POS

## 2019-06-13 SURGERY — COLONOSCOPY WITH PROPOFOL
Anesthesia: Monitor Anesthesia Care

## 2019-06-13 MED ORDER — DEXTROSE-NACL 5-0.45 % IV SOLN: INTRAVENOUS | Status: DC

## 2019-06-13 MED ORDER — IOHEXOL 300 MG/ML  SOLN
100.0000 mL | Freq: Once | INTRAMUSCULAR | Status: AC | PRN
  Administered 2019-06-13: 12 mL via INTRA_ARTERIAL

## 2019-06-13 MED ORDER — PROPOFOL 500 MG/50ML IV EMUL
INTRAVENOUS | Status: DC | PRN
Start: 2019-06-13 — End: 2019-06-13
  Administered 2019-06-13: 200 ug/kg/min via INTRAVENOUS

## 2019-06-13 MED ORDER — MIDAZOLAM HCL 2 MG/2ML IJ SOLN
INTRAMUSCULAR | Status: AC
  Filled 2019-06-13: qty 2

## 2019-06-13 MED ORDER — PROPOFOL 10 MG/ML IV BOLUS
INTRAVENOUS | Status: DC | PRN
  Administered 2019-06-13 (×2): 40 mg via INTRAVENOUS

## 2019-06-13 MED ORDER — CIPROFLOXACIN IN D5W 400 MG/200ML IV SOLN
INTRAVENOUS | Status: AC
  Filled 2019-06-13: qty 200

## 2019-06-13 MED ORDER — ALBUTEROL SULFATE HFA 108 (90 BASE) MCG/ACT IN AERS
INHALATION_SPRAY | RESPIRATORY_TRACT | Status: DC | PRN
Start: 2019-06-13 — End: 2019-06-13
  Administered 2019-06-13: 2 via RESPIRATORY_TRACT

## 2019-06-13 MED ORDER — LIDOCAINE HCL 1 % IJ SOLN
INTRAMUSCULAR | Status: AC
  Filled 2019-06-13: qty 20

## 2019-06-13 MED ORDER — SODIUM CHLORIDE 0.9 % IV SOLN: INTRAVENOUS | Status: DC

## 2019-06-13 MED ORDER — LACTATED RINGERS IV SOLN
INTRAVENOUS | Status: AC | PRN
  Administered 2019-06-13: 1000 mL via INTRAVENOUS

## 2019-06-13 MED ORDER — IOHEXOL 300 MG/ML  SOLN
150.0000 mL | Freq: Once | INTRAMUSCULAR | Status: AC | PRN
  Administered 2019-06-13: 32 mL via INTRA_ARTERIAL

## 2019-06-13 MED ORDER — ONDANSETRON HCL 4 MG PO TABS: 4.0000 mg | ORAL_TABLET | Freq: Four times a day (QID) | ORAL | Status: DC | PRN

## 2019-06-13 MED ORDER — ACETAMINOPHEN 650 MG RE SUPP: 650.0000 mg | Freq: Four times a day (QID) | RECTAL | Status: DC | PRN

## 2019-06-13 MED ORDER — FENTANYL CITRATE (PF) 100 MCG/2ML IJ SOLN
INTRAMUSCULAR | Status: AC
  Filled 2019-06-13: qty 2

## 2019-06-13 MED ORDER — EPINEPHRINE 1 MG/10ML IJ SOSY
PREFILLED_SYRINGE | INTRAMUSCULAR | Status: AC
  Filled 2019-06-13: qty 20

## 2019-06-13 MED ORDER — HYDROCODONE-ACETAMINOPHEN 5-325 MG PO TABS
1.0000 | ORAL_TABLET | ORAL | Status: DC | PRN
  Administered 2019-06-13: 1 via ORAL
  Filled 2019-06-13: qty 1

## 2019-06-13 MED ORDER — LIDOCAINE HCL (PF) 1 % IJ SOLN
INTRAMUSCULAR | Status: AC | PRN
  Administered 2019-06-13: 10 mL

## 2019-06-13 MED ORDER — MORPHINE SULFATE (PF) 2 MG/ML IV SOLN: 2.0000 mg | INTRAVENOUS | Status: DC | PRN

## 2019-06-13 MED ORDER — ONDANSETRON HCL 4 MG/2ML IJ SOLN: 4.0000 mg | Freq: Four times a day (QID) | INTRAMUSCULAR | Status: DC | PRN

## 2019-06-13 MED ORDER — ACETAMINOPHEN 325 MG PO TABS: 650.0000 mg | ORAL_TABLET | Freq: Four times a day (QID) | ORAL | Status: DC | PRN

## 2019-06-13 MED ORDER — CIPROFLOXACIN IN D5W 400 MG/200ML IV SOLN
INTRAVENOUS | Status: DC | PRN
  Administered 2019-06-13: 400 mg via INTRAVENOUS

## 2019-06-13 MED ORDER — SODIUM CHLORIDE 0.9% FLUSH
3.0000 mL | Freq: Two times a day (BID) | INTRAVENOUS | Status: DC
  Administered 2019-06-13 (×2): 3 mL via INTRAVENOUS

## 2019-06-13 MED ORDER — SODIUM CHLORIDE (PF) 0.9 % IJ SOLN
PREFILLED_SYRINGE | INTRAMUSCULAR | Status: DC | PRN
  Administered 2019-06-13: 9.5 mL
  Administered 2019-06-13: 12:00:00 2.5 mL

## 2019-06-13 SURGICAL SUPPLY — 22 items

## 2019-06-13 NOTE — Anesthesia Procedure Notes (Signed)
Procedure Name: MAC Date/Time: 06/13/2019 9:20 AM Performed by: Amadeo Garnet, CRNA Pre-anesthesia Checklist: Patient identified, Emergency Drugs available, Suction available and Patient being monitored Patient Re-evaluated:Patient Re-evaluated prior to induction Oxygen Delivery Method: Nasal cannula Preoxygenation: Pre-oxygenation with 100% oxygen Induction Type: IV induction Placement Confirmation: positive ETCO2 Dental Injury: Teeth and Oropharynx as per pre-operative assessment

## 2019-06-13 NOTE — Consult Note (Signed)
Chief Complaint: Patient was seen in consultation today for GI bleed  Referring Physician(s): Dr. Rush Landmark  Supervising Physician: Arne Cleveland  Patient Status: [facility] - In-pt  History of Present Illness: [name] is a 69 y.o. male with past medical history of HIV, HTN, syphilis who presented to [facility] today for cecal polypectomy. During procedure, bleeding was noted and persistent despite placement of clips, epinephrine injection, and hemostatic spray.  Patient returned to recovery room and IR consulted for angiogram with possible embolization.   Case reviewed by Dr. Vernard Gambles who has discussed with Dr. Rush Landmark.  Plan to proceed with procedure in IR today.   Patient assessed in recovery this morning.  He is still sleeping soundly after procedure. Snoring loudly.  Vital signs stable: HR 57, BP 138/69.   Past Medical History:  Diagnosis Date  . BPH (benign prostatic hyperplasia)   . Erectile dysfunction   . History of kidney stones   . HIV infection ([place])   . Hypertension   . Pruritic rash 04/21/2015  . Secondary polycythemia 05/26/2016  . Syphilis 04/19/2018    Past Surgical History:  Procedure Laterality Date  . COLONOSCOPY    . PARTIAL COLECTOMY      Allergies: Patient has no known allergies.  Medications: Prior to Admission medications   Medication Sig Start Date End Date Taking? Authorizing Provider  amLODipine (NORVASC) 5 MG tablet TAKE 1 TABLET(5 MG) BY MOUTH DAILY Patient taking differently: Take 5 mg by mouth daily. TAKE 1 TABLET(5 MG) BY MOUTH DAILY 02/22/19  Yes Wendie Agreste, MD  elvitegravir-cobicistat-emtricitabine-tenofovir (GENVOYA) 150-150-200-10 MG TABS tablet TAKE 1 TABLET BY MOUTH EVERY DAY WITH BREAKFAST Patient taking differently: Take 1 tablet by mouth daily with breakfast. TAKE 1 TABLET BY MOUTH EVERY DAY WITH BREAKFAST 03/27/19  Yes Tommy Medal, Lavell Islam, MD     Family History  Problem Relation Age of Onset  . Heart failure  Mother   . Stroke Father   . Colon cancer Neg Hx   . Esophageal cancer Neg Hx   . Stomach cancer Neg Hx   . Rectal cancer Neg Hx   . Inflammatory bowel disease Neg Hx   . Liver disease Neg Hx   . Pancreatic cancer Neg Hx     Social History   Socioeconomic History  . Marital status: Single    Spouse name: Not on file  . Number of children: 0  . Years of education: Not on file  . Highest education level: Not on file  Occupational History  . Occupation: retired  Tobacco Use  . Smoking status: Current Every Day Smoker    Packs/day: 1.00    Types: Cigarettes    Start date: 08/01/2016  . Smokeless tobacco: Never Used  . Tobacco comment: tobacco info given  Substance and Sexual Activity  . Alcohol use: Yes    Alcohol/week: 0.0 standard drinks    Comment: social  . Drug use: No  . Sexual activity: Never    Comment: given condoms  Other Topics Concern  . Not on file  Social History Narrative  . Not on file   Social Determinants of Health   Financial Resource Strain:   . Difficulty of Paying Living Expenses:   Food Insecurity:   . Worried About Charity fundraiser in the Last Year:   . Arboriculturist in the Last Year:   Transportation Needs:   . Film/video editor (Medical):   Marland Kitchen Lack of Transportation (Non-Medical):  Physical Activity:   . Days of Exercise per Week:   . Minutes of Exercise per Session:   Stress:   . Feeling of Stress :   Social Connections:   . Frequency of Communication with Friends and Family:   . Frequency of Social Gatherings with Friends and Family:   . Attends Religious Services:   . Active Member of Clubs or Organizations:   . Attends Archivist Meetings:   Marland Kitchen Marital Status:      Review of Systems: A 12 point ROS discussed and pertinent positives are indicated in the HPI above.  All other systems are negative.  Review of Systems  Unable to perform ROS: Acuity of condition    Vital Signs: BP (!) 132/96   Pulse (!) 58    Temp 98.7 F (37.1 C) (Oral)   Resp (!) 30   Ht 5\' 10"  (1.778 m)   Wt 177 lb (80.3 kg)   SpO2 97%   BMI 25.40 kg/m   Physical Exam Vitals and nursing note reviewed.  Constitutional:      Appearance: Normal appearance.  HENT:     Mouth/Throat:     Mouth: Mucous membranes are moist.     Pharynx: Oropharynx is clear.  Cardiovascular:     Rate and Rhythm: Regular rhythm. Bradycardia present.  Pulmonary:     Effort: Pulmonary effort is normal.     Breath sounds: Normal breath sounds.  Abdominal:     General: Abdomen is flat.     Palpations: Abdomen is soft.     Comments: BRBPR, large clot noted  Skin:    General: Skin is warm and dry.  Neurological:     General: No focal deficit present.     Mental Status: He is alert and oriented to person, place, and time.  Psychiatric:        Mood and Affect: Mood normal.        Behavior: Behavior normal.        Thought Content: Thought content normal.        Judgment: Judgment normal.      MD Evaluation Airway: WNL Heart: WNL Abdomen: WNL ASA  Classification: 2 Mallampati/Airway Score: Two   Imaging: No results found.  Labs:  CBC: Recent Labs    10/11/18 0953 03/07/19 0919 05/24/19 0948  WBC 5.7 6.0 6.6  HGB 17.4* 17.2* 17.4*  HCT 49.6 48.1 50.4  PLT 203 220 250.0    COAGS: Recent Labs    05/24/19 0948  INR 1.1*    BMP: Recent Labs    10/11/18 0953 03/07/19 0919 05/24/19 0948  NA 139 140 136  K 4.9 4.5 4.5  CL 104 103 103  CO2 29 30 26   GLUCOSE 95 89 102*  BUN 11 13 13   CALCIUM 9.6 10.1 10.0  CREATININE 1.21 1.10 1.14  GFRNONAA 62 69  --   GFRAA 72 80  --     LIVER FUNCTION TESTS: Recent Labs    10/11/18 0953 03/07/19 0919  BILITOT 0.5 0.5  AST 18 17  ALT 24 23  PROT 6.9 7.3    TUMOR MARKERS: No results for input(s): AFPTM, CEA, CA199, CHROMGRNA in the last 8760 hours.  Assessment and Plan: GI bleed s/p polypectomy  Patient with BRBPR after polypectomy today; persistent oozing  after intervention during endoscopy.  Patient assessed in recovery.  His vital signs are currently stable.  Discussed with Dr. Rush Landmark.  Patient has limited support and declined information be shared  with any outside parties therefore emergent consent obtained as he will need urgent procedure but has undergone sedation today.  Remains NPO.   Planning for procedure in IR today.  RN aware that patient may have to return to Endo vs. PACU post-procedure until bed available for overnight observation on GI service.   Thank you for this interesting consult.  I greatly enjoyed meeting ROHAAN BONAWITZ and look forward to participating in their care.  A copy of this report was sent to the requesting provider on this date.  Electronically Signed: Docia Barrier, PA 06/13/2019, 12:39 PM   I spent a total of 40 Minutes    in face to face in clinical consultation, greater than 50% of which was counseling/coordinating care for GI bleed.

## 2019-06-13 NOTE — Transfer of Care (Signed)
Immediate Anesthesia Transfer of Care Note  Patient: [name]  Procedure(s) Performed: COLONOSCOPY WITH PROPOFOL (N/A ) ENDOSCOPIC MUCOSAL RESECTION (N/A ) SUBMUCOSAL LIFTING INJECTION HEMOSTASIS CLIP PLACEMENT HEMOSTASIS CONTROL  Patient Location: PACU  Anesthesia Type:MAC  Level of Consciousness: awake, alert  and oriented  Airway & Oxygen Therapy: Patient Spontanous Breathing and Patient connected to face mask oxygen  Post-op Assessment: Report given to RN, Post -op Vital signs reviewed and stable and Patient moving all extremities  Post vital signs: Reviewed and stable  Last Vitals:  Vitals Value Taken Time  BP    Temp    Pulse 58 06/13/19 1155  Resp 28 06/13/19 1155  SpO2 93 % 06/13/19 1155  Vitals shown include unvalidated device data.  Last Pain:  Vitals:   06/13/19 0853  TempSrc: Oral  PainSc: 0-No pain         Complications: No apparent anesthesia complications

## 2019-06-13 NOTE — Progress Notes (Signed)
Patient colonoscopy procedure note from 06/13/19 printed and placed in paper chart and well as sent to medical records for scanning.

## 2019-06-13 NOTE — Progress Notes (Signed)
Patient to go to inpatient bed 5M21 following IR procedure. Report called to [place], Therapist, sports. She was going to meet patient and receive report from IR team. Advised endo report would be in addition. She will call endo back if she needs additional report.

## 2019-06-13 NOTE — Procedures (Signed)
  Procedure: SMA arteriogram, coil embolization of ileocolic branch   EBL:   minimal Complications:  none immediate  See full dictation in [employer].  Dillard Cannon MD Main # [phone] Pager  [phone]

## 2019-06-13 NOTE — Progress Notes (Signed)
Endoscopy recovery complete, patient remains in endoscopy awaiting procedure in interventional radiology.

## 2019-06-13 NOTE — Op Note (Addendum)
[facility] Patient Name: [name] Procedure Date : 06/13/2019 MRN: [id number] Attending MD: Justice Britain , MD Date of Birth: [birth year] CSN: [id number] Age: 69 Admit Type: Outpatient Procedure:                Colonoscopy Indications:              Excision of colonic polyp Providers:                Justice Britain, MD, Carlyn Reichert, RN, Laverda Sorenson, Technician, Silas Flood, CRNA Referring MD:             Carlota Raspberry. Havery Moros, MD, Ranell Patrick. Carlota Raspberry, MD Medicines:                Monitored Anesthesia Care Complications:            Significant hemorrhage Estimated Blood Loss:     Estimated blood loss was minimal. Procedure:                Pre-Anesthesia Assessment:                           - Prior to the procedure, a History and Physical                            was performed, and patient medications and                            allergies were reviewed. The patient's tolerance of                            previous anesthesia was also reviewed. The risks                            and benefits of the procedure and the sedation                            options and risks were discussed with the patient.                            All questions were answered, and informed consent                            was obtained. Prior Anticoagulants: The patient has                            taken no previous anticoagulant or antiplatelet                            agents. ASA Grade Assessment: II - A patient with                            mild systemic disease. After reviewing the risks  and benefits, the patient was deemed in                            satisfactory condition to undergo the procedure.                           After obtaining informed consent, the colonoscope                            was passed under direct vision. Throughout the                            procedure, the patient's blood  pressure, pulse, and                            oxygen saturations were monitored continuously. The                            CF-HQ190L ([id number]) Olympus colonoscope was                            introduced through the anus and advanced to the 5                            cm into the ileum. The colonoscopy was technically                            difficult and complex due to excessive bleeding.                            Successful completion of the procedure was aided by                            performing the maneuvers documented (below) in this                            report. The patient tolerated the procedure. The                            quality of the bowel preparation was good. The                            terminal ileum, ileocecal valve, appendiceal                            orifice, and rectum were photographed. Scope In: 9:34:02 AM Scope Out: 11:35:56 AM Scope Withdrawal Time: 1 hour 59 minutes 2 seconds  Total Procedure Duration: 2 hours 1 minute 54 seconds  Findings:      The digital rectal exam findings include hemorrhoids. Pertinent       negatives include no palpable rectal lesions.      The terminal ileum and ileocecal valve appeared normal.      A 45-50 mm polyp was found in the cecum. The polyp was thought to be  potentially pedunculated, but was really more semi-pedunculated even       underwater. I visualized what was felt to be an appendiceal orifice for       a brief period in time but the polyp kept falling on it due to overall       size. As I was able to partially visualize what was felt to be an AO,       preparations were made for attempt at mucosal resection. Orise gel was       injected to raise the lesion. Lift was noted to be excellent around the       lesion entirely. Decision made to pursue resection attempt. Piecemeal       mucosal resection using a snare was performed with 5 resections.       Resection and retrieval were complete.  Bleeding was noted near       immediately after the second to last resection. I proceed with 1:100000       Epinephrine injection with approximately 6 cc. I placed 1 hemoclip in       the region and this led to stability and no further bleeding. I       completed the resection. I retrieved the polyp tissue. I went back into       the cecum and after 15 minutes there was no bleeding. I saw the region       had a persistent defect and so I began to close the area further in       effort of decreasing bleeding. After 8 clips had been placed, the region       was monitored for another 10-minutes. No bleeding was noted during that       time. I cleared the rest of the colon. I returned to the cecum, and       there was recurrent oozing from the distal region where clips had been       placed. I then used an additional 6 cc of 1:100000 epinephrine and 3 cc       of 1:10000 epinephrine, in attempt of trying to stop the bleeding. I       proceeded with an additional 11 hemoclips in attempt of closing the       defect and stopping the bleeding but this was not successful. To treat       the persistent oozing, hemostatic spray was deployed. Multiple sprays       were applied. There was no bleeding at the end of the procedure.      Multiple small-mouthed diverticula were found in the recto-sigmoid       colon, sigmoid colon, descending colon and ascending colon.      The rest of the colon mucosa was grossly normal.      Non-bleeding non-thrombosed external and internal hemorrhoids were found       during retroflexion, during perianal exam and during digital exam. The       hemorrhoids were Grade II (internal hemorrhoids that prolapse but reduce       spontaneously). Impression:               - Hemorrhoids found on digital rectal exam.                           - The examined portion of the ileum was normal.                           -  One 45-50 mm polyp in the cecum, removed with                             piecemeal mucosal resection. Resected and                            retrieved. Bleeding was noted during the procedure.                            Injected with Epinephrine as above. Clips (MR                            conditional) were placed. Persistent oozing noted.                            Hemostatic spray applied.                           - Diverticulosis in the recto-sigmoid colon, in the                            sigmoid colon, in the descending colon and in the                            ascending colon.                           - Non-bleeding non-thrombosed external and internal                            hemorrhoids. Recommendation:           - The patient will be observed post-procedure,                            until all discharge criteria are met.                           - Transport patient to OR.                           - NPO.                           - I discussed case with Surgery and Interventional                            Radiology. Based on the resection, I did not see                            any further polyp tissue in the region. I did not                            see anything concerning for a target sign in the  polyp tissue retrieved. Discussion as to whether to                            proceed with an extended appendectomy vs IR                            embolization attempt to stop further/recurrent                            bleeding was had. Patient had previously stated,                            that surgery was not something he wanted to pursue                            if possible. Decision made that in current                            hemodynamic stability to proceed with an attempt at                            IR intervention that would be less potentially                            morbid, since emergency consent would need to be                            obtained. We are allowing patient to  wake up and                            have further discussion. Hopefully he will not have                            significant discomfort from the Epinephrine used,                            but we can certainly rule out other etiologies if                            necessary with imaging should there be concern for                            other complication.                           - Patient will need to be admitted for observation                            post IR Intervention. I think, that the region                            should be embolized in the setting of his  persistent bleeding even if nothing is noted. If he                            fails and has persistent bleeding, then would                            recommend Surgery for Extended Appendectomy. I have                            placed the IR consultation. I will update the                            surgical service so that they will be available                            should issues persist.                           - The findings and recommendations were discussed                            with the patient - though unable to give consent                            since he just had 2 hours of MAC Anesthesia. Procedure Code(s):        --- Professional ---                           [phone], Colonoscopy, flexible; with endoscopic                            mucosal resection Diagnosis Code(s):        --- Professional ---                           K64.1, Second degree hemorrhoids                           K63.5, Polyp of colon                           K57.30, Diverticulosis of large intestine without                            perforation or abscess without bleeding CPT copyright 2019 American Medical Association. All rights reserved. The codes documented in this report are preliminary and upon coder review may  be revised to meet current compliance requirements. Justice Britain, MD 06/13/2019 12:07:05 PM Number of Addenda: 0

## 2019-06-13 NOTE — Anesthesia Postprocedure Evaluation (Signed)
Anesthesia Post Note  Patient: [name]  Procedure(s) Performed: COLONOSCOPY WITH PROPOFOL (N/A ) ENDOSCOPIC MUCOSAL RESECTION (N/A ) SUBMUCOSAL LIFTING INJECTION HEMOSTASIS CLIP PLACEMENT HEMOSTASIS CONTROL     Anesthesia Post Evaluation  Last Vitals:  Vitals:   06/13/19 1245 06/13/19 1300  BP: (!) 109/46 104/63  Pulse: (!) 53   Resp: 12   Temp:    SpO2: 99% 98%    Last Pain:  Vitals:   06/13/19 1300  TempSrc:   PainSc: 0-No pain                 Barnet Glasgow

## 2019-06-13 NOTE — H&P (Signed)
Admission Note  Primary Care Physician:  Wendie Agreste, MD Primary Gastroenterologist: Dr Rush Landmark   MD/Dr Armbruster  HPI: [name] is a 69 y.o. male, who is being admitted today after undergoing colonoscopy with Dr. Rush Landmark.  Patient has history of hypertension and HIV for which he is on treatment, also with history of syphilis, BPH and secondary polycythemia. 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 He underwent CT of the abdomen and pelvis which showed a 3.5 cm mass but no invasion or issues with the appendix itself.  Also noted possible early cirrhosis and recanalization of the paraumbilical vein.  Patient was then referred to Dr. Rush Landmark   for possible endoscopic management of this lesion.  Patient underwent colonoscopy with EMR of this appendiceal/cecal mass this morning.  At completion of the procedure he developed bleeding and required endoscopic therapy for control of bleeding with epinephrine and multiple endoclips.. There is concern for continued oozing, and therefore IR has been consulted and patient will go for CT angio urgently with plans for embolization if persistent active bleeding. Patient is currently hemodynamically stable and has no complaints of abdominal pain or discomfort. Dr. Rush Landmark has also been in contact with surgery/Dr. Reece Agar in the event IR embolization is not successful.   Past Medical History:  Diagnosis Date  . BPH (benign prostatic hyperplasia)   . Erectile dysfunction   . History of kidney stones   . HIV infection ([place])   . Hypertension   . Pruritic rash 04/21/2015  . Secondary polycythemia 05/26/2016  . Syphilis 04/19/2018    Past Surgical History:  Procedure Laterality Date  . COLONOSCOPY    . PARTIAL COLECTOMY      Prior to Admission  medications   Medication Sig Start Date End Date Taking? Authorizing Provider  amLODipine (NORVASC) 5 MG tablet TAKE 1 TABLET(5 MG) BY MOUTH DAILY Patient taking differently: Take 5 mg by mouth daily. TAKE 1 TABLET(5 MG) BY MOUTH DAILY 02/22/19  Yes Wendie Agreste, MD  elvitegravir-cobicistat-emtricitabine-tenofovir (GENVOYA) 150-150-200-10 MG TABS tablet TAKE 1 TABLET BY MOUTH EVERY DAY WITH BREAKFAST Patient taking differently: Take 1 tablet by mouth daily with breakfast. TAKE 1 TABLET BY MOUTH EVERY DAY WITH BREAKFAST 03/27/19  Yes Tommy Medal, Lavell Islam, MD    Current Facility-Administered Medications  Medication Dose Route Frequency Provider Last Rate Last Admin  . 0.9 %  sodium chloride infusion   Intravenous Continuous Mansouraty, Telford Nab., MD      . acetaminophen (TYLENOL) tablet 650 mg  650 mg Oral Q6H PRN Aaron Pendry S, [name]       Or  . acetaminophen (TYLENOL) suppository 650 mg  650 mg Rectal Q6H PRN Aaron Aday S, [name]      . dextrose 5 %-0.45 % sodium chloride infusion   Intravenous Continuous Aaron Istre S, [name]      . HYDROcodone-acetaminophen (NORCO/VICODIN) 5-325 MG per tablet 1-2 tablet  1-2 tablet Oral Q4H PRN Aaron Stettler S, [name]      . morphine 2 MG/ML injection 2 mg  2 mg Intravenous Q3H PRN Aaron Timson S, [name]      . ondansetron (ZOFRAN) tablet 4 mg  4 mg Oral Q6H PRN [name] S, [name]       Or  . ondansetron (ZOFRAN) injection 4 mg  4 mg Intravenous Q6H PRN [name],  Aaron Bashor S, [name]      . sodium chloride flush (NS) 0.9 % injection 3 mL  3 mL Intravenous Q12H [name], [name]        Allergies as of 05/25/2019  . (No Known Allergies)    Family History  Problem Relation Age of Onset  . Heart failure Mother   . Stroke Father   . Colon cancer Neg Hx   . Esophageal cancer Neg Hx   . Stomach cancer Neg Hx   . Rectal cancer Neg Hx   . Inflammatory bowel disease Neg Hx   . Liver disease Neg Hx   . Pancreatic cancer Neg Hx     Social  History   Socioeconomic History  . Marital status: Single    Spouse name: Not on file  . Number of children: 0  . Years of education: Not on file  . Highest education level: Not on file  Occupational History  . Occupation: retired  Tobacco Use  . Smoking status: Current Every Day Smoker    Packs/day: 1.00    Types: Cigarettes    Start date: 08/01/2016  . Smokeless tobacco: Never Used  . Tobacco comment: tobacco info given  Substance and Sexual Activity  . Alcohol use: Yes    Alcohol/week: 0.0 standard drinks    Comment: social  . Drug use: No  . Sexual activity: Never    Comment: given condoms  Other Topics Concern  . Not on file  Social History Narrative  . Not on file   Social Determinants of Health   Financial Resource Strain:   . Difficulty of Paying Living Expenses:   Food Insecurity:   . Worried About Charity fundraiser in the Last Year:   . Arboriculturist in the Last Year:   Transportation Needs:   . Film/video editor (Medical):   Marland Kitchen Lack of Transportation (Non-Medical):   Physical Activity:   . Days of Exercise per Week:   . Minutes of Exercise per Session:   Stress:   . Feeling of Stress :   Social Connections:   . Frequency of Communication with Friends and Family:   . Frequency of Social Gatherings with Friends and Family:   . Attends Religious Services:   . Active Member of Clubs or Organizations:   . Attends Archivist Meetings:   Marland Kitchen Marital Status:   Intimate Partner Violence:   . Fear of Current or Ex-Partner:   . Emotionally Abused:   Marland Kitchen Physically Abused:   . Sexually Abused:     Review of Systems:  All systems reviewed an negative except where noted in HPI.   Physical Exam: Vital signs in last 24 hours: Temp:  [98.7 F (37.1 C)] 98.7 F (37.1 C) (05/12 0853) Pulse Rate:  [56-58] 58 (05/12 1215) Resp:  [14-30] 30 (05/12 1215) BP: (118-152)/(69-97) 132/96 (05/12 1215) SpO2:  [92 %-100 %] 97 % (05/12 1215) Weight:   [80.3 kg] 80.3 kg (05/12 0853)   General:  Pleasant, well-developed, white male  in NAD Head:  Normocephalic and atraumatic. Eyes:  Sclera clear, no icterus.   Conjunctiva pink. Ears:  Normal auditory acuity. Mouth:  No deformity or lesions.  Neck:  Supple; no masses . Lungs:  Clear throughout to auscultation.   No wheezes, crackles, or rhonchi. No acute distress. Heart:  Regular rate and rhythm; no murmurs. Abdomen:  Soft, nondistended, nontender. No masses, hepatomegaly. No obvious masses.  Normal bowel .  Rectal:   Not done  Msk:  Symmetrical without gross deformities.. Pulses:  Normal pulses noted. Extremities:  Without edema. Neurologic:  Alert and  oriented x4;  grossly normal neurologically. Skin:  Intact without significant lesions or rashes. Cervical Nodes:  No significant cervical adenopathy. Psych:  Alert and cooperative. Normal mood and affect.  Lab Results: No results for input(Blackwell): WBC, HGB, HCT, PLT in the last 72 hours. BMET No results for input(Blackwell): NA, K, CL, CO2, GLUCOSE, BUN, CREATININE, CALCIUM in the last 72 hours. LFT No results for input(Blackwell): PROT, ALBUMIN, AST, ALT, ALKPHOS, BILITOT, BILIDIR, IBILI in the last 72 hours. PT/INR No results for input(Blackwell): LABPROT, INR in the last 72 hours. Hepatitis Panel No results for input(Blackwell): HEPBSAG, HCVAB, HEPAIGM, HEPBIGM in the last 72 hours.   Impression / Plan:   #45  69 year old male, status post colonoscopy today with EMR of 3.5 cm cecal mass. Patient developed bleeding after completion of resection and required endoscopic intervention with epinephrine and multiple endoclips.  There is concern for ongoing oozing despite aggressive therapy.  #2 history of hypertension #3.  History of HIV on treatment #Knots in his belly 4.  BPH #5.  Diverticulosis  Plan; Patient will be admitted to observation. Keep n.p.o.  IR has been consulted urgently for CT angio and possible embolization. If unable to control bleeding  via IR intervention, surgery will be indicated for appendectomy and cecectomy.  Surgery has been contacted.  Labs now, and hemoglobin every 6 hours, and transfuse as indicated.   LOS: 0 days   Avelyn Touch [name] 06/13/2019, 12:22 PM

## 2019-06-13 NOTE — Interval H&P Note (Signed)
History and Physical Interval Note:  06/13/2019 8:50 AM  [name]  has presented today for surgery, with the diagnosis of cecal polyp.  The various methods of treatment have been discussed with the patient and family. After consideration of risks, benefits and other options for treatment, the patient has consented to  Procedure(s): COLONOSCOPY WITH PROPOFOL (N/A) ENDOSCOPIC MUCOSAL RESECTION (N/A) as a surgical intervention.  The patient's history has been reviewed, patient examined, no change in status, stable for surgery.  I have reviewed the patient's chart and labs.  Questions were answered to the patient's satisfaction.     [employer]

## 2019-06-13 NOTE — Plan of Care (Signed)
  Problem: Education: Goal: Knowledge of General Education information will improve Description: Including pain rating scale, medication(s)/side effects and non-pharmacologic comfort measures Outcome: Progressing   Problem: Health Behavior/Discharge Planning: Goal: Ability to manage health-related needs will improve Outcome: Progressing  Bleeding risk post endo/embolization explained, hemoglobin and vital signs will be monitored.

## 2019-06-14 ENCOUNTER — Telehealth: Payer: Self-pay

## 2019-06-14 DIAGNOSIS — K5731 Diverticulosis of large intestine without perforation or abscess with bleeding: Secondary | ICD-10-CM | POA: Diagnosis not present

## 2019-06-14 DIAGNOSIS — K922 Gastrointestinal hemorrhage, unspecified: Secondary | ICD-10-CM

## 2019-06-14 LAB — BASIC METABOLIC PANEL
Anion gap: 7 (ref 5–15)
BUN: 7 mg/dL — ABNORMAL LOW (ref 8–23)
CO2: 24 mmol/L (ref 22–32)
Calcium: 8.2 mg/dL — ABNORMAL LOW (ref 8.9–10.3)
Chloride: 107 mmol/L (ref 98–111)
Creatinine, Ser: 1.03 mg/dL (ref 0.61–1.24)
GFR calc Af Amer: >60 mL/min (ref >60)
GFR calc non Af Amer: >60 mL/min (ref >60)
Glucose, Bld: 114 mg/dL — ABNORMAL HIGH (ref 70–99)
Potassium: 3.6 mmol/L (ref 3.5–5.1)
Sodium: 138 mmol/L (ref 135–145)

## 2019-06-14 LAB — CBC
HCT: 36 % — ABNORMAL LOW (ref 39.0–52.0)
Hemoglobin: 12.2 g/dL — ABNORMAL LOW (ref 13.0–17.0)
MCH: 32.8 pg (ref 26.0–34.0)
MCHC: 33.9 g/dL (ref 30.0–36.0)
MCV: 96.8 fL (ref 80.0–100.0)
Platelets: 197 10*3/uL (ref 150–400)
RBC: 3.72 MIL/uL — ABNORMAL LOW (ref 4.22–5.81)
RDW: 12.4 % (ref 11.5–15.5)
WBC: 6 10*3/uL (ref 4.0–10.5)
nRBC: 0 % (ref 0.0–0.2)

## 2019-06-14 LAB — HEMOGLOBIN AND HEMATOCRIT, BLOOD
HCT: 36.3 % — ABNORMAL LOW (ref 39.0–52.0)
Hemoglobin: 12.5 g/dL — ABNORMAL LOW (ref 13.0–17.0)

## 2019-06-14 NOTE — Discharge Summary (Signed)
[facility] Discharge Summary  Name: [name] MRN: [id number] DOB: 1950/05/28 69 y.o. PCP:  Wendie Agreste, MD  Date of Admission: 06/13/2019  8:27 AM Date of Discharge: 06/14/2019 Attending Physician: Irving Copas.,*  Discharge Diagnosis:/Hospital course Active Problems:   Acute upper GI bleed  69 year old male, who underwent EMR of a cecal mass lesion/villous adenoma with high-grade dysplasia per Dr. Stefani Dama Roddy on 06/13/2019.  Patient developed bleeding post procedure requiring endoscopic intervention with multiple endoclips. Patient remained hemodynamically stable but continued to bleed and therefore IR was consulted for CT angio and embolization.   Procedure as outlined below, successful for control of bleeding. Patient has been quite stable overnight, no bowel movements or bleeding.  He has no complaints of abdominal pain and is anxious to be discharged.  Consultations:  IR  Procedures Performed:  IR Angiogram Visceral Selective  Result Date: 06/13/2019 CLINICAL DATA:  Bleeding near the appendix and cecum on endoscopy, with incomplete control using endoscopic clips and other techniques. EXAM: SELECTIVE VISCERAL ARTERIOGRAPHY; IR ULTRASOUND GUIDANCE VASC ACCESS RIGHT; ADDITIONAL ARTERIOGRAPHY; IR EMBO ART VEN HEMORR LYMPH EXTRAV INC GUIDE ROADMAPPING ANESTHESIA/SEDATION: None required MEDICATIONS: Lidocaine 1% subcutaneous CONTRAST:  48mL OMNIPAQUE IOHEXOL 300 MG/ML SOLN, 47mL OMNIPAQUE IOHEXOL 300 MG/ML SOLN PROCEDURE: The procedure, risks (including but not limited to bleeding, infection, organ damage ), benefits, and alternatives were explained to the patient. Questions regarding the procedure were encouraged and answered. The patient understands and consents to the procedure. Right femoral region prepped and draped in usual sterile fashion. Maximal barrier sterile technique was utilized including caps, mask, sterile gowns, sterile gloves, sterile drape,  hand hygiene and skin antiseptic. The right common femoral artery was localized under ultrasound. Under real-time ultrasound guidance, the vessel was accessed with a 21-gauge micropuncture needle, exchanged over a 018 guidewire for a transitional dilator, through which a 035 guidewire was advanced. Over this, a 5 [place] vascular sheath was placed, through which a 5 [place] C2 catheter was advanced and used to selectively catheterize the superior mesenteric artery for selective arteriography. A coaxial Renegade catheter was advanced with the aid of an angled 016 guidewire 2 the ileocolic branch. With the aid of a transcend guidewire, the catheter was advanced beyond third order branch with additional selective arteriography. Coil embolization was performed with a single 3 mm coil. After follow-up arteriogram, microcatheter and catheter removed. Local hemostasis achieved with the aid of the Exoseal device after confirmatory femoral arteriography. The patient tolerated the procedure well. COMPLICATIONS: None immediate FINDINGS: Selective superior mesenteric arteriogram demonstrates replaced right hepatic arterial supply, an anatomic variant. Active extravasation was noted from a peripheral branch of the ileocolic division just inferior to the right lower quadrant endoscopic clips in the region of the cecum. The microcatheter was advanced to the feeding artery and continued extravasation confirmed. The artery was closed with a single 3 mm coil. Follow-up arteriography demonstrates no further extravasation. Other regional branches remain patent. IMPRESSION: 1. POSITIVE for active extravasation in the cecum from peripheral branch of the ileocolic division superior mesenteric artery. 2. Technically successful coil embolization of the peripheral feeding artery without immediate complication. Electronically Signed   By: Lucrezia Europe M.D.   On: 06/13/2019 15:32   IR Angiogram Selective Each Additional Vessel  Result Date:  06/13/2019 CLINICAL DATA:  Bleeding near the appendix and cecum on endoscopy, with incomplete control using endoscopic clips and other techniques. EXAM: SELECTIVE VISCERAL ARTERIOGRAPHY; IR ULTRASOUND GUIDANCE VASC ACCESS RIGHT; ADDITIONAL ARTERIOGRAPHY; IR EMBO ART VEN HEMORR  LYMPH EXTRAV INC GUIDE ROADMAPPING ANESTHESIA/SEDATION: None required MEDICATIONS: Lidocaine 1% subcutaneous CONTRAST:  7mL OMNIPAQUE IOHEXOL 300 MG/ML SOLN, 35mL OMNIPAQUE IOHEXOL 300 MG/ML SOLN PROCEDURE: The procedure, risks (including but not limited to bleeding, infection, organ damage ), benefits, and alternatives were explained to the patient. Questions regarding the procedure were encouraged and answered. The patient understands and consents to the procedure. Right femoral region prepped and draped in usual sterile fashion. Maximal barrier sterile technique was utilized including caps, mask, sterile gowns, sterile gloves, sterile drape, hand hygiene and skin antiseptic. The right common femoral artery was localized under ultrasound. Under real-time ultrasound guidance, the vessel was accessed with a 21-gauge micropuncture needle, exchanged over a 018 guidewire for a transitional dilator, through which a 035 guidewire was advanced. Over this, a 5 [place] vascular sheath was placed, through which a 5 [place] C2 catheter was advanced and used to selectively catheterize the superior mesenteric artery for selective arteriography. A coaxial Renegade catheter was advanced with the aid of an angled 016 guidewire 2 the ileocolic branch. With the aid of a transcend guidewire, the catheter was advanced beyond third order branch with additional selective arteriography. Coil embolization was performed with a single 3 mm coil. After follow-up arteriogram, microcatheter and catheter removed. Local hemostasis achieved with the aid of the Exoseal device after confirmatory femoral arteriography. The patient tolerated the procedure well. COMPLICATIONS:  None immediate FINDINGS: Selective superior mesenteric arteriogram demonstrates replaced right hepatic arterial supply, an anatomic variant. Active extravasation was noted from a peripheral branch of the ileocolic division just inferior to the right lower quadrant endoscopic clips in the region of the cecum. The microcatheter was advanced to the feeding artery and continued extravasation confirmed. The artery was closed with a single 3 mm coil. Follow-up arteriography demonstrates no further extravasation. Other regional branches remain patent. IMPRESSION: 1. POSITIVE for active extravasation in the cecum from peripheral branch of the ileocolic division superior mesenteric artery. 2. Technically successful coil embolization of the peripheral feeding artery without immediate complication. Electronically Signed   By: Lucrezia Europe M.D.   On: 06/13/2019 15:32   IR US Guide Vasc Access Right  Result Date: 06/13/2019 CLINICAL DATA:  Bleeding near the appendix and cecum on endoscopy, with incomplete control using endoscopic clips and other techniques. EXAM: SELECTIVE VISCERAL ARTERIOGRAPHY; IR ULTRASOUND GUIDANCE VASC ACCESS RIGHT; ADDITIONAL ARTERIOGRAPHY; IR EMBO ART VEN HEMORR LYMPH EXTRAV INC GUIDE ROADMAPPING ANESTHESIA/SEDATION: None required MEDICATIONS: Lidocaine 1% subcutaneous CONTRAST:  54mL OMNIPAQUE IOHEXOL 300 MG/ML SOLN, 28mL OMNIPAQUE IOHEXOL 300 MG/ML SOLN PROCEDURE: The procedure, risks (including but not limited to bleeding, infection, organ damage ), benefits, and alternatives were explained to the patient. Questions regarding the procedure were encouraged and answered. The patient understands and consents to the procedure. Right femoral region prepped and draped in usual sterile fashion. Maximal barrier sterile technique was utilized including caps, mask, sterile gowns, sterile gloves, sterile drape, hand hygiene and skin antiseptic. The right common femoral artery was localized under ultrasound.  Under real-time ultrasound guidance, the vessel was accessed with a 21-gauge micropuncture needle, exchanged over a 018 guidewire for a transitional dilator, through which a 035 guidewire was advanced. Over this, a 5 [place] vascular sheath was placed, through which a 5 [place] C2 catheter was advanced and used to selectively catheterize the superior mesenteric artery for selective arteriography. A coaxial Renegade catheter was advanced with the aid of an angled 016 guidewire 2 the ileocolic branch. With the aid of a transcend guidewire, the catheter was  advanced beyond third order branch with additional selective arteriography. Coil embolization was performed with a single 3 mm coil. After follow-up arteriogram, microcatheter and catheter removed. Local hemostasis achieved with the aid of the Exoseal device after confirmatory femoral arteriography. The patient tolerated the procedure well. COMPLICATIONS: None immediate FINDINGS: Selective superior mesenteric arteriogram demonstrates replaced right hepatic arterial supply, an anatomic variant. Active extravasation was noted from a peripheral branch of the ileocolic division just inferior to the right lower quadrant endoscopic clips in the region of the cecum. The microcatheter was advanced to the feeding artery and continued extravasation confirmed. The artery was closed with a single 3 mm coil. Follow-up arteriography demonstrates no further extravasation. Other regional branches remain patent. IMPRESSION: 1. POSITIVE for active extravasation in the cecum from peripheral branch of the ileocolic division superior mesenteric artery. 2. Technically successful coil embolization of the peripheral feeding artery without immediate complication. Electronically Signed   By: Lucrezia Europe M.D.   On: 06/13/2019 15:32   IR EMBO ART  VEN HEMORR LYMPH EXTRAV  INC GUIDE ROADMAPPING  Result Date: 06/13/2019 CLINICAL DATA:  Bleeding near the appendix and cecum on endoscopy, with  incomplete control using endoscopic clips and other techniques. EXAM: SELECTIVE VISCERAL ARTERIOGRAPHY; IR ULTRASOUND GUIDANCE VASC ACCESS RIGHT; ADDITIONAL ARTERIOGRAPHY; IR EMBO ART VEN HEMORR LYMPH EXTRAV INC GUIDE ROADMAPPING ANESTHESIA/SEDATION: None required MEDICATIONS: Lidocaine 1% subcutaneous CONTRAST:  82mL OMNIPAQUE IOHEXOL 300 MG/ML SOLN, 63mL OMNIPAQUE IOHEXOL 300 MG/ML SOLN PROCEDURE: The procedure, risks (including but not limited to bleeding, infection, organ damage ), benefits, and alternatives were explained to the patient. Questions regarding the procedure were encouraged and answered. The patient understands and consents to the procedure. Right femoral region prepped and draped in usual sterile fashion. Maximal barrier sterile technique was utilized including caps, mask, sterile gowns, sterile gloves, sterile drape, hand hygiene and skin antiseptic. The right common femoral artery was localized under ultrasound. Under real-time ultrasound guidance, the vessel was accessed with a 21-gauge micropuncture needle, exchanged over a 018 guidewire for a transitional dilator, through which a 035 guidewire was advanced. Over this, a 5 [place] vascular sheath was placed, through which a 5 [place] C2 catheter was advanced and used to selectively catheterize the superior mesenteric artery for selective arteriography. A coaxial Renegade catheter was advanced with the aid of an angled 016 guidewire 2 the ileocolic branch. With the aid of a transcend guidewire, the catheter was advanced beyond third order branch with additional selective arteriography. Coil embolization was performed with a single 3 mm coil. After follow-up arteriogram, microcatheter and catheter removed. Local hemostasis achieved with the aid of the Exoseal device after confirmatory femoral arteriography. The patient tolerated the procedure well. COMPLICATIONS: None immediate FINDINGS: Selective superior mesenteric arteriogram demonstrates  replaced right hepatic arterial supply, an anatomic variant. Active extravasation was noted from a peripheral branch of the ileocolic division just inferior to the right lower quadrant endoscopic clips in the region of the cecum. The microcatheter was advanced to the feeding artery and continued extravasation confirmed. The artery was closed with a single 3 mm coil. Follow-up arteriography demonstrates no further extravasation. Other regional branches remain patent. IMPRESSION: 1. POSITIVE for active extravasation in the cecum from peripheral branch of the ileocolic division superior mesenteric artery. 2. Technically successful coil embolization of the peripheral feeding artery without immediate complication. Electronically Signed   By: Lucrezia Europe M.D.   On: 06/13/2019 15:32      History/Physical Exam:  See Admission H&P   Discharge Vitals:  BP 132/79 (BP Location: Left Arm)   Pulse 70   Temp 99.4 F (37.4 C) (Oral)   Resp 18   Ht 5\' 10"  (1.778 m)   Wt 80.3 kg   SpO2 97%   BMI 25.40 kg/m   Discharge Labs:  Results for orders placed or performed during the hospital encounter of 06/13/19 (from the past 24 hour(s))  Type and screen [place]     Status: None   Collection Time: 06/13/19 10:20 AM  Result Value Ref Range   ABO/RH(D) A POS    Antibody Screen NEG    Sample Expiration      06/16/2019,2359 Performed at [facility], [place] [address], [place], [place] [address]   ABO/Rh     Status: None   Collection Time: 06/13/19 10:20 AM  Result Value Ref Range   ABO/RH(D)      A POS Performed at [facility] [address], [place], [place] [address]   CBC     Status: Abnormal   Collection Time: 06/13/19  2:00 PM  Result Value Ref Range   WBC 6.7 4.0 - 10.5 K/uL   RBC 4.08 (L) 4.22 - 5.81 MIL/uL   Hemoglobin 13.5 13.0 - 17.0 g/dL   HCT 39.9 39.0 - 52.0 %   MCV 97.8 80.0 - 100.0 fL   MCH 33.1 26.0 - 34.0 pg   MCHC 33.8 30.0 - 36.0 g/dL   RDW 12.6  11.5 - 15.5 %   Platelets 224 150 - 400 K/uL   nRBC 0.0 0.0 - 0.2 %  Comprehensive metabolic panel     Status: Abnormal   Collection Time: 06/13/19  3:12 PM  Result Value Ref Range   Sodium 135 135 - 145 mmol/L   Potassium 4.5 3.5 - 5.1 mmol/L   Chloride 102 98 - 111 mmol/L   CO2 22 22 - 32 mmol/L   Glucose, Bld 106 (H) 70 - 99 mg/dL   BUN 7 (L) 8 - 23 mg/dL   Creatinine, Ser 1.09 0.61 - 1.24 mg/dL   Calcium 8.0 (L) 8.9 - 10.3 mg/dL   Total Protein 5.4 (L) 6.5 - 8.1 g/dL   Albumin 3.0 (L) 3.5 - 5.0 g/dL   AST 21 15 - 41 U/L   ALT 17 0 - 44 U/L   Alkaline Phosphatase 54 38 - 126 U/L   Total Bilirubin 0.8 0.3 - 1.2 mg/dL   GFR calc non Af Amer >60 >60 mL/min   GFR calc Af Amer >60 >60 mL/min   Anion gap 11 5 - 15  Protime-INR     Status: None   Collection Time: 06/13/19  3:12 PM  Result Value Ref Range   Prothrombin Time 15.2 11.4 - 15.2 seconds   INR 1.2 0.8 - 1.2  Hemoglobin and hematocrit, blood     Status: None   Collection Time: 06/13/19  3:12 PM  Result Value Ref Range   Hemoglobin 13.8 13.0 - 17.0 g/dL   HCT 41.2 39.0 - 52.0 %  Hemoglobin and hematocrit, blood     Status: None   Collection Time: 06/13/19  6:24 PM  Result Value Ref Range   Hemoglobin 13.7 13.0 - 17.0 g/dL   HCT 40.0 39.0 - 52.0 %  Hemoglobin and hematocrit, blood     Status: Abnormal   Collection Time: 06/14/19 12:08 AM  Result Value Ref Range   Hemoglobin 12.5 (L) 13.0 - 17.0 g/dL   HCT 36.3 (L)  39.0 - 52.0 %  CBC     Status: Abnormal   Collection Time: 06/14/19  7:11 AM  Result Value Ref Range   WBC 6.0 4.0 - 10.5 K/uL   RBC 3.72 (L) 4.22 - 5.81 MIL/uL   Hemoglobin 12.2 (L) 13.0 - 17.0 g/dL   HCT 36.0 (L) 39.0 - 52.0 %   MCV 96.8 80.0 - 100.0 fL   MCH 32.8 26.0 - 34.0 pg   MCHC 33.9 30.0 - 36.0 g/dL   RDW 12.4 11.5 - 15.5 %   Platelets 197 150 - 400 K/uL   nRBC 0.0 0.0 - 0.2 %  Basic metabolic panel     Status: Abnormal   Collection Time: 06/14/19  7:11 AM  Result Value Ref Range    Sodium 138 135 - 145 mmol/L   Potassium 3.6 3.5 - 5.1 mmol/L   Chloride 107 98 - 111 mmol/L   CO2 24 22 - 32 mmol/L   Glucose, Bld 114 (H) 70 - 99 mg/dL   BUN 7 (L) 8 - 23 mg/dL   Creatinine, Ser 1.03 0.61 - 1.24 mg/dL   Calcium 8.2 (L) 8.9 - 10.3 mg/dL   GFR calc non Af Amer >60 >60 mL/min   GFR calc Af Amer >60 >60 mL/min   Anion gap 7 5 - 15    Disposition and follow-up:   Mr.[name] was discharged from [facility] in stable condition.    Follow-up Appointments: labs in one week  Follow up with Dr. Rush Landmark in 1 to 2 weeks, office will call with appointment time   Discharge Medications: Allergies as of 06/14/2019   No Known Allergies     Medication List    TAKE these medications   amLODipine 5 MG tablet Commonly known as: NORVASC TAKE 1 TABLET(5 MG) BY MOUTH DAILY What changed:   how much to take  how to take this  when to take this   Genvoya 150-150-200-10 MG Tabs tablet Generic drug: elvitegravir-cobicistat-emtricitabine-tenofovir TAKE 1 TABLET BY MOUTH EVERY DAY WITH BREAKFAST What changed:   how much to take  how to take this  when to take this       Signed: Akia Desroches EsterwoodPA-C 06/14/2019, 9:44 AM

## 2019-06-14 NOTE — Progress Notes (Signed)
DISCHARGE NOTE HOME JAZAVION YOHO to be discharged Home per MD order. Discussed prescriptions and follow up appointments with the patient. Prescriptions given to patient; medication list explained in detail. Patient verbalized understanding.  Skin clean, dry and intact without evidence of skin break down, no evidence of skin tears noted. IV catheter discontinued intact. Site without signs and symptoms of complications. Dressing and pressure applied. Pt denies pain at the site currently. No complaints noted.  Patient free of lines, drains, and wounds.   An After Visit Summary (AVS) was printed and given to the patient. Patient escorted via wheelchair, and discharged home via private auto.  Aneta Mins BSN, RN3

## 2019-06-14 NOTE — Telephone Encounter (Signed)
07/10/19 at 930 am appt with Nicoletta Ba and lab order entered. The pt was advised via message and My Chart

## 2019-06-14 NOTE — Telephone Encounter (Signed)
-----   Message from Irving Copas., MD sent at 06/14/2019  8:35 AM EDT ----- Regarding: Follow up Alvina Strother, This patient was admitted for observation after my polyp resection. He likely will be discharged later today. I need him to come in next week to have a CBC/BMP performed. Can you please set up the labs and reach out to him tomorrow to ensure he knows to come in? I also want to schedule a follow up with Amy or myself in 2-3 weeks to check how he is doing overall, so we can work on arranging that as able. Thank you. GM

## 2019-06-14 NOTE — Discharge Instructions (Signed)
Take it easy at home over the next 10 days, no heavy lifting strenuous exercise excessive straining. Avoid all aspirin and anti-inflammatories, may use Tylenol Full liquid to soft diet over the next 2 days then gradually advance. Call Dr. Donneta Romberg  office for any problems, if any recurrence of bleeding with bloody bowel movements return to the emergency room immediately.

## 2019-06-14 NOTE — Progress Notes (Signed)
Patient ID: [name], male   DOB: Aug 10, 1950, 69 y.o.   MRN: [id number]    Progress Note   Subjective   Day #2 CC; GI bleed post EMR of cecal mass   S/p IR coil  embolization of peripheral branch of ileocolic division of SMA yesterday  HGb 13.7>12.5>12.2 WBC 6.0 Creat 1.03  Pt feels fine, no c/o abdominal  pain or discomfort no bowel movement since procedures yesterday. Patient hoping to be discharged this morning       Objective   Vital signs in last 24 hours: Temp:  [98.3 F (36.8 C)-99.3 F (37.4 C)] 98.4 F (36.9 C) (05/13 0510) Pulse Rate:  [41-88] 80 (05/13 0510) Resp:  [12-30] 16 (05/13 0510) BP: (70-152)/(45-97) 112/79 (05/13 0510) SpO2:  [92 %-100 %] 99 % (05/13 0510) Weight:  [80.3 kg] 80.3 kg (05/12 0853) Last BM Date: 06/13/19 General:    white male in NAD Heart:  Regular rate and rhythm; no murmurs Lungs: Respirations even and unlabored, lungs CTA bilaterally Abdomen:  Soft, nontender and nondistended. Normal bowel sounds. Extremities:  Without edema. Neurologic:  Alert and oriented,  grossly normal neurologically. Psych:  Cooperative. Normal mood and affect.  Intake/Output from previous day: 05/12 0701 - 05/13 0700 In: 3252.6 [I.V.:3052.6; IV Piggyback:200] Out: 1900 [Urine:1600; Blood:300] Intake/Output this shift: No intake/output data recorded.  Lab Results: Recent Labs    06/13/19 1400 06/13/19 1512 06/13/19 1824 06/14/19 0008 06/14/19 0711  WBC 6.7  --   --   --  6.0  HGB 13.5   < > 13.7 12.5* 12.2*  HCT 39.9   < > 40.0 36.3* 36.0*  PLT 224  --   --   --  197   < > = values in this interval not displayed.   BMET Recent Labs    06/13/19 1512 06/14/19 0711  NA 135 138  K 4.5 3.6  CL 102 107  CO2 22 24  GLUCOSE 106* 114*  BUN 7* 7*  CREATININE 1.09 1.03  CALCIUM 8.0* 8.2*   LFT Recent Labs    06/13/19 1512  PROT 5.4*  ALBUMIN 3.0*  AST 21  ALT 17  ALKPHOS 54  BILITOT 0.8   PT/INR Recent Labs    06/13/19 1512    LABPROT 15.2  INR 1.2    Studies/Results: IR Angiogram Visceral Selective  Result Date: 06/13/2019 CLINICAL DATA:  Bleeding near the appendix and cecum on endoscopy, with incomplete control using endoscopic clips and other techniques. EXAM: SELECTIVE VISCERAL ARTERIOGRAPHY; IR ULTRASOUND GUIDANCE VASC ACCESS RIGHT; ADDITIONAL ARTERIOGRAPHY; IR EMBO ART VEN HEMORR LYMPH EXTRAV INC GUIDE ROADMAPPING ANESTHESIA/SEDATION: None required MEDICATIONS: Lidocaine 1% subcutaneous CONTRAST:  27mL OMNIPAQUE IOHEXOL 300 MG/ML SOLN, 33mL OMNIPAQUE IOHEXOL 300 MG/ML SOLN PROCEDURE: The procedure, risks (including but not limited to bleeding, infection, organ damage ), benefits, and alternatives were explained to the patient. Questions regarding the procedure were encouraged and answered. The patient understands and consents to the procedure. Right femoral region prepped and draped in usual sterile fashion. Maximal barrier sterile technique was utilized including caps, mask, sterile gowns, sterile gloves, sterile drape, hand hygiene and skin antiseptic. The right common femoral artery was localized under ultrasound. Under real-time ultrasound guidance, the vessel was accessed with a 21-gauge micropuncture needle, exchanged over a 018 guidewire for a transitional dilator, through which a 035 guidewire was advanced. Over this, a 5 [place] vascular sheath was placed, through which a 5 [place] C2 catheter was advanced and used to selectively catheterize  the superior mesenteric artery for selective arteriography. A coaxial Renegade catheter was advanced with the aid of an angled 016 guidewire 2 the ileocolic branch. With the aid of a transcend guidewire, the catheter was advanced beyond third order branch with additional selective arteriography. Coil embolization was performed with a single 3 mm coil. After follow-up arteriogram, microcatheter and catheter removed. Local hemostasis achieved with the aid of the Exoseal device  after confirmatory femoral arteriography. The patient tolerated the procedure well. COMPLICATIONS: None immediate FINDINGS: Selective superior mesenteric arteriogram demonstrates replaced right hepatic arterial supply, an anatomic variant. Active extravasation was noted from a peripheral branch of the ileocolic division just inferior to the right lower quadrant endoscopic clips in the region of the cecum. The microcatheter was advanced to the feeding artery and continued extravasation confirmed. The artery was closed with a single 3 mm coil. Follow-up arteriography demonstrates no further extravasation. Other regional branches remain patent. IMPRESSION: 1. POSITIVE for active extravasation in the cecum from peripheral branch of the ileocolic division superior mesenteric artery. 2. Technically successful coil embolization of the peripheral feeding artery without immediate complication. Electronically Signed   By: Lucrezia Europe M.D.   On: 06/13/2019 15:32   IR Angiogram Selective Each Additional Vessel  Result Date: 06/13/2019 CLINICAL DATA:  Bleeding near the appendix and cecum on endoscopy, with incomplete control using endoscopic clips and other techniques. EXAM: SELECTIVE VISCERAL ARTERIOGRAPHY; IR ULTRASOUND GUIDANCE VASC ACCESS RIGHT; ADDITIONAL ARTERIOGRAPHY; IR EMBO ART VEN HEMORR LYMPH EXTRAV INC GUIDE ROADMAPPING ANESTHESIA/SEDATION: None required MEDICATIONS: Lidocaine 1% subcutaneous CONTRAST:  53mL OMNIPAQUE IOHEXOL 300 MG/ML SOLN, 14mL OMNIPAQUE IOHEXOL 300 MG/ML SOLN PROCEDURE: The procedure, risks (including but not limited to bleeding, infection, organ damage ), benefits, and alternatives were explained to the patient. Questions regarding the procedure were encouraged and answered. The patient understands and consents to the procedure. Right femoral region prepped and draped in usual sterile fashion. Maximal barrier sterile technique was utilized including caps, mask, sterile gowns, sterile gloves,  sterile drape, hand hygiene and skin antiseptic. The right common femoral artery was localized under ultrasound. Under real-time ultrasound guidance, the vessel was accessed with a 21-gauge micropuncture needle, exchanged over a 018 guidewire for a transitional dilator, through which a 035 guidewire was advanced. Over this, a 5 [place] vascular sheath was placed, through which a 5 [place] C2 catheter was advanced and used to selectively catheterize the superior mesenteric artery for selective arteriography. A coaxial Renegade catheter was advanced with the aid of an angled 016 guidewire 2 the ileocolic branch. With the aid of a transcend guidewire, the catheter was advanced beyond third order branch with additional selective arteriography. Coil embolization was performed with a single 3 mm coil. After follow-up arteriogram, microcatheter and catheter removed. Local hemostasis achieved with the aid of the Exoseal device after confirmatory femoral arteriography. The patient tolerated the procedure well. COMPLICATIONS: None immediate FINDINGS: Selective superior mesenteric arteriogram demonstrates replaced right hepatic arterial supply, an anatomic variant. Active extravasation was noted from a peripheral branch of the ileocolic division just inferior to the right lower quadrant endoscopic clips in the region of the cecum. The microcatheter was advanced to the feeding artery and continued extravasation confirmed. The artery was closed with a single 3 mm coil. Follow-up arteriography demonstrates no further extravasation. Other regional branches remain patent. IMPRESSION: 1. POSITIVE for active extravasation in the cecum from peripheral branch of the ileocolic division superior mesenteric artery. 2. Technically successful coil embolization of the peripheral feeding artery without immediate complication.  Electronically Signed   By: Lucrezia Europe M.D.   On: 06/13/2019 15:32   IR US Guide Vasc Access Right  Result Date:  06/13/2019 CLINICAL DATA:  Bleeding near the appendix and cecum on endoscopy, with incomplete control using endoscopic clips and other techniques. EXAM: SELECTIVE VISCERAL ARTERIOGRAPHY; IR ULTRASOUND GUIDANCE VASC ACCESS RIGHT; ADDITIONAL ARTERIOGRAPHY; IR EMBO ART VEN HEMORR LYMPH EXTRAV INC GUIDE ROADMAPPING ANESTHESIA/SEDATION: None required MEDICATIONS: Lidocaine 1% subcutaneous CONTRAST:  77mL OMNIPAQUE IOHEXOL 300 MG/ML SOLN, 78mL OMNIPAQUE IOHEXOL 300 MG/ML SOLN PROCEDURE: The procedure, risks (including but not limited to bleeding, infection, organ damage ), benefits, and alternatives were explained to the patient. Questions regarding the procedure were encouraged and answered. The patient understands and consents to the procedure. Right femoral region prepped and draped in usual sterile fashion. Maximal barrier sterile technique was utilized including caps, mask, sterile gowns, sterile gloves, sterile drape, hand hygiene and skin antiseptic. The right common femoral artery was localized under ultrasound. Under real-time ultrasound guidance, the vessel was accessed with a 21-gauge micropuncture needle, exchanged over a 018 guidewire for a transitional dilator, through which a 035 guidewire was advanced. Over this, a 5 [place] vascular sheath was placed, through which a 5 [place] C2 catheter was advanced and used to selectively catheterize the superior mesenteric artery for selective arteriography. A coaxial Renegade catheter was advanced with the aid of an angled 016 guidewire 2 the ileocolic branch. With the aid of a transcend guidewire, the catheter was advanced beyond third order branch with additional selective arteriography. Coil embolization was performed with a single 3 mm coil. After follow-up arteriogram, microcatheter and catheter removed. Local hemostasis achieved with the aid of the Exoseal device after confirmatory femoral arteriography. The patient tolerated the procedure well. COMPLICATIONS:  None immediate FINDINGS: Selective superior mesenteric arteriogram demonstrates replaced right hepatic arterial supply, an anatomic variant. Active extravasation was noted from a peripheral branch of the ileocolic division just inferior to the right lower quadrant endoscopic clips in the region of the cecum. The microcatheter was advanced to the feeding artery and continued extravasation confirmed. The artery was closed with a single 3 mm coil. Follow-up arteriography demonstrates no further extravasation. Other regional branches remain patent. IMPRESSION: 1. POSITIVE for active extravasation in the cecum from peripheral branch of the ileocolic division superior mesenteric artery. 2. Technically successful coil embolization of the peripheral feeding artery without immediate complication. Electronically Signed   By: Lucrezia Europe M.D.   On: 06/13/2019 15:32   IR EMBO ART  VEN HEMORR LYMPH EXTRAV  INC GUIDE ROADMAPPING  Result Date: 06/13/2019 CLINICAL DATA:  Bleeding near the appendix and cecum on endoscopy, with incomplete control using endoscopic clips and other techniques. EXAM: SELECTIVE VISCERAL ARTERIOGRAPHY; IR ULTRASOUND GUIDANCE VASC ACCESS RIGHT; ADDITIONAL ARTERIOGRAPHY; IR EMBO ART VEN HEMORR LYMPH EXTRAV INC GUIDE ROADMAPPING ANESTHESIA/SEDATION: None required MEDICATIONS: Lidocaine 1% subcutaneous CONTRAST:  81mL OMNIPAQUE IOHEXOL 300 MG/ML SOLN, 67mL OMNIPAQUE IOHEXOL 300 MG/ML SOLN PROCEDURE: The procedure, risks (including but not limited to bleeding, infection, organ damage ), benefits, and alternatives were explained to the patient. Questions regarding the procedure were encouraged and answered. The patient understands and consents to the procedure. Right femoral region prepped and draped in usual sterile fashion. Maximal barrier sterile technique was utilized including caps, mask, sterile gowns, sterile gloves, sterile drape, hand hygiene and skin antiseptic. The right common femoral artery was  localized under ultrasound. Under real-time ultrasound guidance, the vessel was accessed with a 21-gauge micropuncture needle, exchanged over a 018  guidewire for a transitional dilator, through which a 035 guidewire was advanced. Over this, a 5 [place] vascular sheath was placed, through which a 5 [place] C2 catheter was advanced and used to selectively catheterize the superior mesenteric artery for selective arteriography. A coaxial Renegade catheter was advanced with the aid of an angled 016 guidewire 2 the ileocolic branch. With the aid of a transcend guidewire, the catheter was advanced beyond third order branch with additional selective arteriography. Coil embolization was performed with a single 3 mm coil. After follow-up arteriogram, microcatheter and catheter removed. Local hemostasis achieved with the aid of the Exoseal device after confirmatory femoral arteriography. The patient tolerated the procedure well. COMPLICATIONS: None immediate FINDINGS: Selective superior mesenteric arteriogram demonstrates replaced right hepatic arterial supply, an anatomic variant. Active extravasation was noted from a peripheral branch of the ileocolic division just inferior to the right lower quadrant endoscopic clips in the region of the cecum. The microcatheter was advanced to the feeding artery and continued extravasation confirmed. The artery was closed with a single 3 mm coil. Follow-up arteriography demonstrates no further extravasation. Other regional branches remain patent. IMPRESSION: 1. POSITIVE for active extravasation in the cecum from peripheral branch of the ileocolic division superior mesenteric artery. 2. Technically successful coil embolization of the peripheral feeding artery without immediate complication. Electronically Signed   By: Lucrezia Europe M.D.   On: 06/13/2019 15:32       Assessment / Plan:    #56 69 year old white male stable status post EMR of cecal mass/villous adenoma with high-grade  dysplasia yesterday per Dr. Rush Landmark. Patient developed bleeding post procedure and required extensive endoscopic therapy to control bleeding with multiple endoclips.  Patient continued to bleed and therefore underwent IR coil embolization of a peripheral branch of the ileocolic with resolution of bleeding.  Patient has been stable overnight and has absolutely no complaints of abdominal discomfort. He had mild drift in hemoglobin but overall has been very stable. Plan is discharge to home today. We will plan to follow him up in the office in 7 to 10 days, check hemoglobin early next week. Patient carefully cautioned to avoid all aspirin and NSAIDs, no strenuous exercise no heavy lifting and to take it easy over the next 10 to 14 days.  He was advised should he have any evidence of recurrent active bleeding to return to the emergency room.         Active Problems:   Acute upper GI bleed     LOS: 0 days   Alston Berrie EsterwoodPA-C  06/14/2019, 8:47 AM

## 2019-06-15 LAB — SURGICAL PATHOLOGY

## 2019-06-18 ENCOUNTER — Encounter: Payer: Self-pay | Admitting: Gastroenterology

## 2019-06-18 ENCOUNTER — Other Ambulatory Visit: Payer: Self-pay

## 2019-06-18 DIAGNOSIS — C801 Malignant (primary) neoplasm, unspecified: Secondary | ICD-10-CM

## 2019-06-18 NOTE — Progress Notes (Signed)
Spoke with patient regarding referral from Dr. Rush Landmark.  I informed him I have scheduled him to come in Thursday 5/19 at 1:45 to arrive by 1:30 to see Cira Rue NP/Dr. Burr Medico.  He states he does not want to have surgery that is why he is referred to Medical oncology first.  He is having a CT of chest tomorrow 5/18.  I explained my role as nurse navigator.  He verbalized an understanding and is in agreement to come for his initial consult on Thursday 5/19 as scheduled.

## 2019-06-19 ENCOUNTER — Ambulatory Visit (HOSPITAL_COMMUNITY)
Admission: RE | Admit: 2019-06-19 | Discharge: 2019-06-19 | Disposition: A | Discharge status: Home / Self Care | Payer: Medicare Other | Source: Ambulatory Visit | Attending: Gastroenterology | Admitting: Gastroenterology

## 2019-06-19 ENCOUNTER — Other Ambulatory Visit: Payer: Self-pay

## 2019-06-19 ENCOUNTER — Other Ambulatory Visit (INDEPENDENT_AMBULATORY_CARE_PROVIDER_SITE_OTHER): Payer: Medicare Other

## 2019-06-19 DIAGNOSIS — K922 Gastrointestinal hemorrhage, unspecified: Secondary | ICD-10-CM | POA: Diagnosis not present

## 2019-06-19 DIAGNOSIS — C801 Malignant (primary) neoplasm, unspecified: Secondary | ICD-10-CM | POA: Diagnosis present

## 2019-06-19 LAB — CBC WITH DIFFERENTIAL/PLATELET
Basophils Absolute: 0 10*3/uL (ref 0.0–0.1)
Basophils Relative: 0.4 % (ref 0.0–3.0)
Eosinophils Absolute: 0.1 10*3/uL (ref 0.0–0.7)
Eosinophils Relative: 1.6 % (ref 0.0–5.0)
HCT: 36.2 % — ABNORMAL LOW (ref 39.0–52.0)
Hemoglobin: 12.7 g/dL — ABNORMAL LOW (ref 13.0–17.0)
Lymphocytes Relative: 24.4 % (ref 12.0–46.0)
Lymphs Abs: 1.5 10*3/uL (ref 0.7–4.0)
MCHC: 35 g/dL (ref 30.0–36.0)
MCV: 96.7 fl (ref 78.0–100.0)
Monocytes Absolute: 0.5 10*3/uL (ref 0.1–1.0)
Monocytes Relative: 8 % (ref 3.0–12.0)
Neutro Abs: 4 10*3/uL (ref 1.4–7.7)
Neutrophils Relative %: 65.6 % (ref 43.0–77.0)
Platelets: 268 10*3/uL (ref 150.0–400.0)
RBC: 3.74 Mil/uL — ABNORMAL LOW (ref 4.22–5.81)
RDW: 13.5 % (ref 11.5–15.5)
WBC: 6 10*3/uL (ref 4.0–10.5)

## 2019-06-19 LAB — BASIC METABOLIC PANEL
BUN: 9 mg/dL (ref 6–23)
CO2: 27 mEq/L (ref 19–32)
Calcium: 8.5 mg/dL (ref 8.4–10.5)
Chloride: 103 mEq/L (ref 96–112)
Creatinine, Ser: 0.95 mg/dL (ref 0.40–1.50)
GFR: 78.95 mL/min (ref >60.00)
Glucose, Bld: 95 mg/dL (ref 70–99)
Potassium: 3.6 mEq/L (ref 3.5–5.1)
Sodium: 135 mEq/L (ref 135–145)

## 2019-06-19 MED ORDER — IOHEXOL 300 MG/ML  SOLN
75.0000 mL | Freq: Once | INTRAMUSCULAR | Status: AC | PRN
  Administered 2019-06-19: 75 mL via INTRAVENOUS

## 2019-06-19 MED ORDER — SODIUM CHLORIDE (PF) 0.9 % IJ SOLN
INTRAMUSCULAR | Status: AC
  Filled 2019-06-19: qty 50

## 2019-06-20 ENCOUNTER — Telehealth: Payer: Self-pay | Admitting: Gastroenterology

## 2019-06-20 ENCOUNTER — Other Ambulatory Visit: Payer: Self-pay

## 2019-06-20 LAB — CEA: CEA: 4 ng/mL — ABNORMAL HIGH

## 2019-06-20 NOTE — Telephone Encounter (Signed)
[facility] Discussion this AM. Case reviewed with Pathology/Radiology/Oncology/Surgery. Although MP was not present on the tissue, the resection margin was such that a SM1 diagnosis was felt more likely. Patient has been hesitant for surgical resection of any sort. He is planned to see Oncology and they will consider Guardian testing as well to help [place] discern if the modality of high-risk surveillance is reasonable in this individual or surgical intervention may need to be considered. Appreciate Oncology evaluation. They and I will talk with patient about at least meeting a surgeon to have a conversation. Plan for endoscopic surveillance at this time.  Likely repeat Colonscopy in 68-months. We will determine things further from there. I left voicemail today as I could not reach patient. We will try to reach out tomorrow. FYI - Dr. Havery Moros. FYI - Patty please move forward with tentative 57-month Colonoscopy with EMR timeslot.  Thanks. GM

## 2019-06-20 NOTE — Telephone Encounter (Signed)
Colon recall was entered as ordered

## 2019-06-20 NOTE — Telephone Encounter (Signed)
Gabe thank you for the follow up, I really appreciate your help with this case

## 2019-06-20 NOTE — Progress Notes (Addendum)
[place]  [phone] Fax:([phone]) [place] Note   Patient Care Team: Wendie Agreste, MD as PCP - General (Family Medicine) Tommy Medal, Lavell Islam, MD as PCP - Infectious Diseases (Infectious Diseases) Jonnie Finner, RN as Oncology Nurse Navigator Truitt Merle, MD as Consulting Physician (Hematology) Alla Feeling, NP as Nurse Practitioner (Nurse Practitioner) Date of Service: 06/21/2019  CHIEF COMPLAINTS/PURPOSE OF CONSULTATION:  Colon cancer, referred by Dr. Rush Landmark   HISTORY OF PRESENTING ILLNESS:  [name] 69 y.o. male with h/o HIV, HTN, BPH, secondary polycythemia, and upper GI bleed is here because of newly diagnosed colon cancer. He underwent first screening colonoscopy on 03/13/2019 by Dr. Havery Moros which showed a large polypoid lesion in the cecum overlying the appendiceal orifice which could not be clearly visualized. The lesion was multi-lobulated and semi-pedunculated but not overtly malignant-appearing. Path showed cecal polyp consistent with tubulovillous adenoma with focal high grade dysplasia, superficial biopsy did not show carcinoma. He underwent CT AP on 03/28/19 which showed 3.5 cm cecal mass corresponding to the colonoscopy, no pericolonic extension or adjacent adenopathy. The hepatic morphology is suspicious for mild cirrhosis with possible portal venous hypertension. He underwent repeat colonoscopy on 06/13/19 which showed 45-55 mm polyp in the cecum which was removed with piecemeal mucosal resection. He was admitted for bleeding after the procedure, hemoglobin 12.2 while inpatient. Path of the cecal mass showed scattered small foci of poorly differentiated invasive adenocarcinoma extending into the superficial submucosa to a depth of 0.3 cm. Resection margins are negative. This was locally staged pT1NX. There is peserved expression of major MMR proteins. Repeat imaging on 06/19/19 showed no metastatic disease in the  chest. CEA on 06/19/19 is 4.   Socially, he is single and lives alone, no children. He is retired Herbalist. Independent with ADLs, he drives himself, has insurance. He is a social drinker without h/o heavy alcohol use. He is an every day smoker for 46 years intermittently, up to >1 PPD at most. Denies other drug use. He does not know how he contracted HIV which is controlled. He denies family history of cancer.   Today, he presents by himself. He feels well with normal appetite. He is down 10 lbs since 03/2019 but he couldn't tell. Denies fatigue, change in bowel habits, rectal bleeding, abdominal pain or bloating, fever, chills, cough, chest pain, dyspnea or other concerns.   MEDICAL HISTORY:  Past Medical History:  Diagnosis Date  . BPH (benign prostatic hyperplasia)   . Erectile dysfunction   . History of kidney stones   . HIV infection ([place])   . Hypertension   . Pruritic rash 04/21/2015  . Secondary polycythemia 05/26/2016  . Syphilis 04/19/2018    SURGICAL HISTORY: Past Surgical History:  Procedure Laterality Date  . COLONOSCOPY    . COLONOSCOPY WITH PROPOFOL N/A 06/13/2019   Procedure: COLONOSCOPY WITH PROPOFOL;  Surgeon: Rush Landmark Telford Nab., MD;  Location: [facility];  Service: Gastroenterology;  Laterality: N/A;  . ENDOSCOPIC MUCOSAL RESECTION N/A 06/13/2019   Procedure: ENDOSCOPIC MUCOSAL RESECTION;  Surgeon: Rush Landmark Telford Nab., MD;  Location: [facility];  Service: Gastroenterology;  Laterality: N/A;  . HEMOSTASIS CLIP PLACEMENT  06/13/2019   Procedure: HEMOSTASIS CLIP PLACEMENT;  Surgeon: Irving Copas., MD;  Location: [facility];  Service: Gastroenterology;;  . HEMOSTASIS CONTROL  06/13/2019   Procedure: HEMOSTASIS CONTROL;  Surgeon: Irving Copas., MD;  Location: [facility];  Service: Gastroenterology;;  . Chrystine Oiler  SELECTIVE EACH ADDITIONAL VESSEL  06/13/2019  . IR ANGIOGRAM VISCERAL SELECTIVE  06/13/2019  . IR EMBO ART  VEN  HEMORR LYMPH EXTRAV  INC GUIDE ROADMAPPING  06/13/2019  . IR US GUIDE VASC ACCESS RIGHT  06/13/2019  . PARTIAL COLECTOMY    . SCLEROTHERAPY  06/13/2019   Procedure: Clide Deutscher;  Surgeon: Mansouraty, Telford Nab., MD;  Location: [facility];  Service: Gastroenterology;;  . Lia Foyer LIFTING INJECTION  06/13/2019   Procedure: SUBMUCOSAL LIFTING INJECTION;  Surgeon: Irving Copas., MD;  Location: [facility];  Service: Gastroenterology;;    SOCIAL HISTORY: Social History   Socioeconomic History  . Marital status: Single    Spouse name: Not on file  . Number of children: 0  . Years of education: Not on file  . Highest education level: Not on file  Occupational History  . Occupation: retired  Tobacco Use  . Smoking status: Current Every Day Smoker    Packs/day: 1.00    Types: Cigarettes    Start date: 08/01/2016  . Smokeless tobacco: Never Used  . Tobacco comment: tobacco info given  Substance and Sexual Activity  . Alcohol use: Yes    Alcohol/week: 0.0 standard drinks    Comment: social  . Drug use: No  . Sexual activity: Never    Comment: given condoms  Other Topics Concern  . Not on file  Social History Narrative  . Not on file   Social Determinants of Health   Financial Resource Strain:   . Difficulty of Paying Living Expenses:   Food Insecurity:   . Worried About Charity fundraiser in the Last Year:   . Arboriculturist in the Last Year:   Transportation Needs:   . Film/video editor (Medical):   Marland Kitchen Lack of Transportation (Non-Medical):   Physical Activity:   . Days of Exercise per Week:   . Minutes of Exercise per Session:   Stress:   . Feeling of Stress :   Social Connections:   . Frequency of Communication with Friends and Family:   . Frequency of Social Gatherings with Friends and Family:   . Attends Religious Services:   . Active Member of Clubs or Organizations:   . Attends Archivist Meetings:   Marland Kitchen Marital Status:   Intimate  Partner Violence:   . Fear of Current or Ex-Partner:   . Emotionally Abused:   Marland Kitchen Physically Abused:   . Sexually Abused:     FAMILY HISTORY: Family History  Problem Relation Age of Onset  . Heart failure Mother   . Stroke Father   . Colon cancer Neg Hx   . Esophageal cancer Neg Hx   . Stomach cancer Neg Hx   . Rectal cancer Neg Hx   . Inflammatory bowel disease Neg Hx   . Liver disease Neg Hx   . Pancreatic cancer Neg Hx     ALLERGIES:  has No Known Allergies.  MEDICATIONS:  Current Outpatient Medications  Medication Sig Dispense Refill  . amLODipine (NORVASC) 5 MG tablet TAKE 1 TABLET(5 MG) BY MOUTH DAILY (Patient taking differently: Take 5 mg by mouth daily. TAKE 1 TABLET(5 MG) BY MOUTH DAILY) 90 tablet 1  . elvitegravir-cobicistat-emtricitabine-tenofovir (GENVOYA) 150-150-200-10 MG TABS tablet TAKE 1 TABLET BY MOUTH EVERY DAY WITH BREAKFAST (Patient taking differently: Take 1 tablet by mouth daily with breakfast. TAKE 1 TABLET BY MOUTH EVERY DAY WITH BREAKFAST) 30 tablet 11   No current facility-administered medications for this visit.  REVIEW OF SYSTEMS:   Constitutional: Denies fevers, chills or abnormal night sweats (+) mild weight loss 10 lbs in 3 months  Eyes: Denies blurriness of vision, double vision or watery eyes Ears, nose, mouth, throat, and face: Denies mucositis or sore throat Respiratory: Denies cough, dyspnea or wheezes Cardiovascular: Denies palpitation, chest discomfort or lower extremity swelling Gastrointestinal:  Denies nausea, heartburn or change in bowel habits Skin: Denies abnormal skin rashes Lymphatics: Denies new lymphadenopathy or easy bruising Neurological:Denies numbness, tingling or new weaknesses Behavioral/Psych: Blackwell is stable, no new changes  All other systems were reviewed with the patient and are negative.  PHYSICAL EXAMINATION: ECOG PERFORMANCE STATUS: 0 - Asymptomatic  Vitals:   06/21/19 1357  BP: (!) 145/79  Pulse: 82    Resp: 18  Temp: 98.1 F (36.7 C)  SpO2: 100%   Filed Weights   06/21/19 1357  Weight: 175 lb 3.2 oz (79.5 kg)    GENERAL:alert, no distress and comfortable SKIN: no rash on exposed skin  EYES:  sclera clear LUNGS:  normal breathing effort HEART: no lower extremity edema ABDOMEN:abdomen soft, non-tender and normal bowel sounds. No hepatomegaly or masses Musculoskeletal:no cyanosis of digits and no clubbing  PSYCH: alert & oriented x 3 with fluent speech NEURO: no focal motor/sensory deficits  LABORATORY DATA:  I have reviewed the data as listed CBC Latest Ref Rng & Units 06/19/2019 06/14/2019 06/14/2019  WBC 4.0 - 10.5 K/uL 6.0 6.0 -  Hemoglobin 13.0 - 17.0 g/dL 12.7(L) 12.2(L) 12.5(L)  Hematocrit 39.0 - 52.0 % 36.2(L) 36.0(L) 36.3(L)  Platelets 150.0 - 400.0 K/uL 268.0 197 -   CMP Latest Ref Rng & Units 06/19/2019 06/14/2019 06/13/2019  Glucose 70 - 99 mg/dL 95 114(H) 106(H)  BUN 6 - 23 mg/dL 9 7(L) 7(L)  Creatinine 0.40 - 1.50 mg/dL 0.95 1.03 1.09  Sodium 135 - 145 mEq/L 135 138 135  Potassium 3.5 - 5.1 mEq/L 3.6 3.6 4.5  Chloride 96 - 112 mEq/L 103 107 102  CO2 19 - 32 mEq/L _0 Calcium 8.4 - 10.5 mg/dL 8.5 8.2(L) 8.0(L)  Total Protein 6.5 - 8.1 g/dL - - 5.4(L)  Total Bilirubin 0.3 - 1.2 mg/dL - - 0.8  Alkaline Phos 38 - 126 U/L - - 54  AST 15 - 41 U/L - - 21  ALT 0 - 44 U/L - - 17    RADIOGRAPHIC STUDIES: I have personally reviewed the radiological images as listed and agreed with the findings in the report. CT CHEST W CONTRAST  Result Date: 06/19/2019 CLINICAL DATA:  Colorectal cancer staging. EXAM: CT CHEST WITH CONTRAST TECHNIQUE: Multidetector CT imaging of the chest was performed during intravenous contrast administration. CONTRAST:  51m OMNIPAQUE IOHEXOL 300 MG/ML  SOLN COMPARISON:  05/15/2008 PET-CT. 03/28/2019 CT abdomen/pelvis. FINDINGS: Cardiovascular: Normal heart size. No significant pericardial effusion/thickening. Atherosclerotic nonaneurysmal  thoracic aorta. Normal caliber pulmonary arteries. No central pulmonary emboli. Mediastinum/Nodes: No discrete thyroid nodules. Unremarkable esophagus. No pathologically enlarged axillary, mediastinal or hilar lymph nodes. Lungs/Pleura: No pneumothorax. No pleural effusion. Mild-to-moderate centrilobular emphysema with mild diffuse bronchial wall thickening. Scattered tiny calcified granulomas at both lung bases. No acute consolidative airspace disease or lung masses. Tiny 3 mm nodule along the left major fissure (series 7/image 69) is stable since 2010 PET-CT and considered benign. No new significant pulmonary nodules. Upper abdomen: Finely irregular liver surface suggests cirrhosis. Scattered calcified splenic granulomas. Musculoskeletal: No aggressive appearing focal osseous lesions. Mild thoracic spondylosis. IMPRESSION: 1. No evidence of metastatic disease  in the chest. 2. Finely irregular liver surface suggests cirrhosis. 3. Aortic Atherosclerosis (ICD10-I70.0) and Emphysema (ICD10-J43.9). Electronically Signed   By: Ilona Sorrel M.D.   On: 06/19/2019 14:06   IR Angiogram Visceral Selective  Result Date: 06/13/2019 CLINICAL DATA:  Bleeding near the appendix and cecum on endoscopy, with incomplete control using endoscopic clips and other techniques. EXAM: SELECTIVE VISCERAL ARTERIOGRAPHY; IR ULTRASOUND GUIDANCE VASC ACCESS RIGHT; ADDITIONAL ARTERIOGRAPHY; IR EMBO ART VEN HEMORR LYMPH EXTRAV INC GUIDE ROADMAPPING ANESTHESIA/SEDATION: None required MEDICATIONS: Lidocaine 1% subcutaneous CONTRAST:  43m OMNIPAQUE IOHEXOL 300 MG/ML SOLN, 127mOMNIPAQUE IOHEXOL 300 MG/ML SOLN PROCEDURE: The procedure, risks (including but not limited to bleeding, infection, organ damage ), benefits, and alternatives were explained to the patient. Questions regarding the procedure were encouraged and answered. The patient understands and consents to the procedure. Right femoral region prepped and draped in usual sterile fashion.  Maximal barrier sterile technique was utilized including caps, mask, sterile gowns, sterile gloves, sterile drape, hand hygiene and skin antiseptic. The right common femoral artery was localized under ultrasound. Under real-time ultrasound guidance, the vessel was accessed with a 21-gauge micropuncture needle, exchanged over a 018 guidewire for a transitional dilator, through which a 035 guidewire was advanced. Over this, a 5 [place] sheath was placed, through which a 5 [place] catheter was advanced and used to selectively catheterize the superior mesenteric artery for selective arteriography. A coaxial Renegade catheter was advanced with the aid of an angled 016 guidewire 2 the ileocolic branch. With the aid of a transcend guidewire, the catheter was advanced beyond third order branch with additional selective arteriography. Coil embolization was performed with a single 3 mm coil. After follow-up arteriogram, microcatheter and catheter removed. Local hemostasis achieved with the aid of the Exoseal device after confirmatory femoral arteriography. The patient tolerated the procedure well. COMPLICATIONS: None immediate FINDINGS: Selective superior mesenteric arteriogram demonstrates replaced right hepatic arterial supply, an anatomic variant. Active extravasation was noted from a peripheral branch of the ileocolic division just inferior to the right lower quadrant endoscopic clips in the region of the cecum. The microcatheter was advanced to the feeding artery and continued extravasation confirmed. The artery was closed with a single 3 mm coil. Follow-up arteriography demonstrates no further extravasation. Other regional branches remain patent. IMPRESSION: 1. POSITIVE for active extravasation in the cecum from peripheral branch of the ileocolic division superior mesenteric artery. 2. Technically successful coil embolization of the peripheral feeding artery without immediate complication. Electronically  Signed   By: D Lucrezia Europe.D.   On: 06/13/2019 15:32   IR Angiogram Selective Each Additional Vessel  Result Date: 06/13/2019 CLINICAL DATA:  Bleeding near the appendix and cecum on endoscopy, with incomplete control using endoscopic clips and other techniques. EXAM: SELECTIVE VISCERAL ARTERIOGRAPHY; IR ULTRASOUND GUIDANCE VASC ACCESS RIGHT; ADDITIONAL ARTERIOGRAPHY; IR EMBO ART VEN HEMORR LYMPH EXTRAV INC GUIDE ROADMAPPING ANESTHESIA/SEDATION: None required MEDICATIONS: Lidocaine 1% subcutaneous CONTRAST:  3238mMNIPAQUE IOHEXOL 300 MG/ML SOLN, 56m48mNIPAQUE IOHEXOL 300 MG/ML SOLN PROCEDURE: The procedure, risks (including but not limited to bleeding, infection, organ damage ), benefits, and alternatives were explained to the patient. Questions regarding the procedure were encouraged and answered. The patient understands and consents to the procedure. Right femoral region prepped and draped in usual sterile fashion. Maximal barrier sterile technique was utilized including caps, mask, sterile gowns, sterile gloves, sterile drape, hand hygiene and skin antiseptic. The right common femoral artery was localized under ultrasound. Under real-time ultrasound guidance, the vessel was accessed with  a 21-gauge micropuncture needle, exchanged over a 018 guidewire for a transitional dilator, through which a 035 guidewire was advanced. Over this, a 5 [place] vascular sheath was placed, through which a 5 [place] C2 catheter was advanced and used to selectively catheterize the superior mesenteric artery for selective arteriography. A coaxial Renegade catheter was advanced with the aid of an angled 016 guidewire 2 the ileocolic branch. With the aid of a transcend guidewire, the catheter was advanced beyond third order branch with additional selective arteriography. Coil embolization was performed with a single 3 mm coil. After follow-up arteriogram, microcatheter and catheter removed. Local hemostasis achieved with the aid of  the Exoseal device after confirmatory femoral arteriography. The patient tolerated the procedure well. COMPLICATIONS: None immediate FINDINGS: Selective superior mesenteric arteriogram demonstrates replaced right hepatic arterial supply, an anatomic variant. Active extravasation was noted from a peripheral branch of the ileocolic division just inferior to the right lower quadrant endoscopic clips in the region of the cecum. The microcatheter was advanced to the feeding artery and continued extravasation confirmed. The artery was closed with a single 3 mm coil. Follow-up arteriography demonstrates no further extravasation. Other regional branches remain patent. IMPRESSION: 1. POSITIVE for active extravasation in the cecum from peripheral branch of the ileocolic division superior mesenteric artery. 2. Technically successful coil embolization of the peripheral feeding artery without immediate complication. Electronically Signed   By: Lucrezia Europe M.D.   On: 06/13/2019 15:32   IR US Guide Vasc Access Right  Result Date: 06/13/2019 CLINICAL DATA:  Bleeding near the appendix and cecum on endoscopy, with incomplete control using endoscopic clips and other techniques. EXAM: SELECTIVE VISCERAL ARTERIOGRAPHY; IR ULTRASOUND GUIDANCE VASC ACCESS RIGHT; ADDITIONAL ARTERIOGRAPHY; IR EMBO ART VEN HEMORR LYMPH EXTRAV INC GUIDE ROADMAPPING ANESTHESIA/SEDATION: None required MEDICATIONS: Lidocaine 1% subcutaneous CONTRAST:  16m OMNIPAQUE IOHEXOL 300 MG/ML SOLN, 165mOMNIPAQUE IOHEXOL 300 MG/ML SOLN PROCEDURE: The procedure, risks (including but not limited to bleeding, infection, organ damage ), benefits, and alternatives were explained to the patient. Questions regarding the procedure were encouraged and answered. The patient understands and consents to the procedure. Right femoral region prepped and draped in usual sterile fashion. Maximal barrier sterile technique was utilized including caps, mask, sterile gowns, sterile  gloves, sterile drape, hand hygiene and skin antiseptic. The right common femoral artery was localized under ultrasound. Under real-time ultrasound guidance, the vessel was accessed with a 21-gauge micropuncture needle, exchanged over a 018 guidewire for a transitional dilator, through which a 035 guidewire was advanced. Over this, a 5 [place] sheath was placed, through which a 5 [place]2 catheter was advanced and used to selectively catheterize the superior mesenteric artery for selective arteriography. A coaxial Renegade catheter was advanced with the aid of an angled 016 guidewire 2 the ileocolic branch. With the aid of a transcend guidewire, the catheter was advanced beyond third order branch with additional selective arteriography. Coil embolization was performed with a single 3 mm coil. After follow-up arteriogram, microcatheter and catheter removed. Local hemostasis achieved with the aid of the Exoseal device after confirmatory femoral arteriography. The patient tolerated the procedure well. COMPLICATIONS: None immediate FINDINGS: Selective superior mesenteric arteriogram demonstrates replaced right hepatic arterial supply, an anatomic variant. Active extravasation was noted from a peripheral branch of the ileocolic division just inferior to the right lower quadrant endoscopic clips in the region of the cecum. The microcatheter was advanced to the feeding artery and continued extravasation confirmed. The artery was closed with a single 3 mm coil.  Follow-up arteriography demonstrates no further extravasation. Other regional branches remain patent. IMPRESSION: 1. POSITIVE for active extravasation in the cecum from peripheral branch of the ileocolic division superior mesenteric artery. 2. Technically successful coil embolization of the peripheral feeding artery without immediate complication. Electronically Signed   By: Lucrezia Europe M.D.   On: 06/13/2019 15:32   IR EMBO ART  VEN HEMORR LYMPH EXTRAV  INC  GUIDE ROADMAPPING  Result Date: 06/13/2019 CLINICAL DATA:  Bleeding near the appendix and cecum on endoscopy, with incomplete control using endoscopic clips and other techniques. EXAM: SELECTIVE VISCERAL ARTERIOGRAPHY; IR ULTRASOUND GUIDANCE VASC ACCESS RIGHT; ADDITIONAL ARTERIOGRAPHY; IR EMBO ART VEN HEMORR LYMPH EXTRAV INC GUIDE ROADMAPPING ANESTHESIA/SEDATION: None required MEDICATIONS: Lidocaine 1% subcutaneous CONTRAST:  72m OMNIPAQUE IOHEXOL 300 MG/ML SOLN, 158mOMNIPAQUE IOHEXOL 300 MG/ML SOLN PROCEDURE: The procedure, risks (including but not limited to bleeding, infection, organ damage ), benefits, and alternatives were explained to the patient. Questions regarding the procedure were encouraged and answered. The patient understands and consents to the procedure. Right femoral region prepped and draped in usual sterile fashion. Maximal barrier sterile technique was utilized including caps, mask, sterile gowns, sterile gloves, sterile drape, hand hygiene and skin antiseptic. The right common femoral artery was localized under ultrasound. Under real-time ultrasound guidance, the vessel was accessed with a 21-gauge micropuncture needle, exchanged over a 018 guidewire for a transitional dilator, through which a 035 guidewire was advanced. Over this, a 5 [place] sheath was placed, through which a 5 [place]2 catheter was advanced and used to selectively catheterize the superior mesenteric artery for selective arteriography. A coaxial Renegade catheter was advanced with the aid of an angled 016 guidewire 2 the ileocolic branch. With the aid of a transcend guidewire, the catheter was advanced beyond third order branch with additional selective arteriography. Coil embolization was performed with a single 3 mm coil. After follow-up arteriogram, microcatheter and catheter removed. Local hemostasis achieved with the aid of the Exoseal device after confirmatory femoral arteriography. The patient tolerated the  procedure well. COMPLICATIONS: None immediate FINDINGS: Selective superior mesenteric arteriogram demonstrates replaced right hepatic arterial supply, an anatomic variant. Active extravasation was noted from a peripheral branch of the ileocolic division just inferior to the right lower quadrant endoscopic clips in the region of the cecum. The microcatheter was advanced to the feeding artery and continued extravasation confirmed. The artery was closed with a single 3 mm coil. Follow-up arteriography demonstrates no further extravasation. Other regional branches remain patent. IMPRESSION: 1. POSITIVE for active extravasation in the cecum from peripheral branch of the ileocolic division superior mesenteric artery. 2. Technically successful coil embolization of the peripheral feeding artery without immediate complication. Electronically Signed   By: D Lucrezia Europe.D.   On: 06/13/2019 15:32    ASSESSMENT & PLAN: [age]o male with   1. Adenocarcinoma of the cecum, poorly differentiated, pT1NXM0, stage I, MMR normal  -We reviewed his medical record including colonoscopy, path, and imaging in great detail. He had a large cecal polyp on first screening colonoscopy which was negative for malignancy, repeat colonoscopy 3 months later in 06/2019 showed multiple foci of adenocarcinoma when it was resected by polypectomy.  -path showed poorly differentiated adenocarcinoma extending into superficial submucosa to a depth of 0.3. his case was reviewed in tumor board, pathologist felt this was SM1 or possibly early SM2. Margins were negative, the polyp was removed in piecemeal fashion  -CEA 4 after removal. He is a smoker.  -CT is negative for metastatic  disease.  -We discussed this is early stage disease, however because he did not have surgery, we are limited in knowing how deep this invades in the colon. We also reviewed his risk for local recurrence given the poor differentiation. We explained that in order to get a more  definitive evaluation and staging, he would require surgery -The patient is not interested in surgery. He understands he will need close surveillance going forward.  -We discussed guardian reveal testing would likely have a low yield in this case, and he is not interested in undergoing further risk assessment with guardian reveal testing  -He will f/u with Dr. Rush Landmark with repeat colonoscopy in 3 months -We will see him back in 6 months with CBC, CMP, and CEA. Recent lab showed mild anemia, likely from bleeding after colonoscopy. Will monitor in the future.  -We plan to repeat surveillance CT in 12 months.  -We reviewed s/sx to monitor which might indicate cancer recurrence or metastasis, including change in bowel habits, change in stool caliber, abdominal pain or bloating, bloody/black stools, n/v, unexplained weight loss or fatigue. He understands.  -We also discussed risk reducing behaviors including abstaining from tobacco, limiting alcohol, healthy diet and exercise. He understands.    2. HIV -diagnosed 1984, controlled per patient  -on [place] per Dr. Tommy Medal  PLAN: -Colonoscopy, path, imaging reviewed -Patient not interested in surgery, will not refer to colorectal surgeon at this time -He agrees with close surveillance -F/u with Dr. Rush Landmark for repeat colonoscopy likely in 3 months -Lab and f/u with me or Dr. Burr Medico in 6 months, with CBC, CMP, CEA (baseline 4)   Orders Placed This Encounter  Procedures  . CBC with Differential (Cancer Center Only)    Standing Status:   Standing    Number of Occurrences:   20    Standing Expiration Date:   06/21/2023  . CMP ([place] only)    Standing Status:   Standing    Number of Occurrences:   20    Standing Expiration Date:   06/21/2023  . CEA (IN HOUSE-[facility])    Standing Status:   Standing    Number of Occurrences:   20    Standing Expiration Date:   06/21/2023    All questions were answered. The patient knows to call the clinic  with any problems, questions or concerns.    Alla Feeling, NP 06/22/2019 2:41 PM  Addendum  I have seen the patient, examined him. I agree with the assessment and and plan and have edited the notes.   Mr.  Blackwell was found to have a large polyp in the cecum on his first screening colonoscopy, status post endoscopy resection.  His case has been reviewed in our GI tumor board, pathology showed a T1 invasive adenocarcinoma, with submucosal invasion.  Margins were negative, this is likely SM1 or SM2 the most submucosa invasion, so the risk of lymph node involvement is low.  His staging CT scan was negative.  We discussed that hemicolectomy with lymph node removal is the standard of care, however patient has repeatedly declined surgery. I think he needs close surveillance, with colonoscopy every 3-6 months for the first few years then yearly for up to 5 years. I will also check his routine labs and tumor marker CEA every 3 to 6 months, with a repeat scan in 6-12 month,then yearly for 2-3 years.  We also discussed testing circulating tumor DNA by [employer], but he is not interested.  We will  coordinate his follow-up with Dr. Rush Landmark, who will see him back in 3 months, and we will see him in 6 months.  All questions were answered.  Truitt Merle  06/21/2019

## 2019-06-21 ENCOUNTER — Encounter: Payer: Self-pay | Admitting: Nurse Practitioner

## 2019-06-21 ENCOUNTER — Inpatient Hospital Stay: Payer: Medicare Other | Attending: Nurse Practitioner | Admitting: Nurse Practitioner

## 2019-06-21 ENCOUNTER — Other Ambulatory Visit: Payer: Self-pay

## 2019-06-21 VITALS — BP 145/79 | HR 82 | Temp 98.1°F | Resp 18 | Ht 70.0 in | Wt 175.2 lb

## 2019-06-21 DIAGNOSIS — Z21 Asymptomatic human immunodeficiency virus [HIV] infection status: Secondary | ICD-10-CM | POA: Insufficient documentation

## 2019-06-21 DIAGNOSIS — F17210 Nicotine dependence, cigarettes, uncomplicated: Secondary | ICD-10-CM | POA: Diagnosis not present

## 2019-06-21 DIAGNOSIS — C180 Malignant neoplasm of cecum: Secondary | ICD-10-CM

## 2019-06-21 NOTE — Progress Notes (Signed)
Met with patient at his initial medical oncology appointment with Cira Rue NP/Dr. Burr Medico today.  He was given my card with my direct phone number and encouraged to call with any questions or concerns.  He verbalized an understanding of my role as navigator.

## 2019-06-21 NOTE — Telephone Encounter (Signed)
Tried to call patient again. Left voicemail [name], I'm away tomorrow. If patient calls back can you follow up with him and give him the information that I have documented here. He is supposed to have seen Oncology today, so he may already understand everything. Let me know if by next week we haven't heard from him. Thanks. GM

## 2019-06-22 ENCOUNTER — Telehealth: Payer: Self-pay | Admitting: Nurse Practitioner

## 2019-06-22 ENCOUNTER — Encounter: Payer: Self-pay | Admitting: Nurse Practitioner

## 2019-06-22 NOTE — Telephone Encounter (Signed)
Scheduled appt per 5/20 los.  Spoke with pt and they are aware of the appt date and time. 

## 2019-06-24 ENCOUNTER — Encounter: Payer: Self-pay | Admitting: Nurse Practitioner

## 2019-06-25 ENCOUNTER — Ambulatory Visit (HOSPITAL_COMMUNITY): Payer: Medicare Other

## 2019-06-25 ENCOUNTER — Encounter: Payer: Self-pay | Admitting: Gastroenterology

## 2019-06-25 NOTE — Progress Notes (Signed)
Thanks for keeping me updated. 

## 2019-06-25 NOTE — Progress Notes (Signed)
After discussion with Oncology and patient preference, he has not decided to move forward with [facility] testing. This will be brought up again in 32-month interval Oncology follow up. He also expressed a non-desire to meet with a surgeon at this time. I will plan to see him at a 54-month interval for endoscopic surveillance in the region. I plan to obtain a CEA level at that time as well. Hopefully all looks well at the time. Discuss surgery once again with patient. We will hold on CT scans until the 83-month interval per discussion with Oncology (they will help with setting this up). We will update his PCP and his primary GI on these recommendations as well.  Justice Britain, MD [facility] # [phone]

## 2019-06-25 NOTE — Progress Notes (Signed)
Thanks for the follow up and coordination of care post EMR, really appreciate it. Please keep [place] posted on his follow up colonoscopy results. Thanks

## 2019-07-03 ENCOUNTER — Telehealth: Payer: Self-pay

## 2019-07-03 NOTE — Telephone Encounter (Signed)
TC to pt per Dr Burr Medico to let him know that his CT chest was negative.

## 2019-07-10 ENCOUNTER — Ambulatory Visit: Payer: Medicare Other | Admitting: Physician Assistant

## 2019-08-19 ENCOUNTER — Other Ambulatory Visit: Payer: Self-pay | Admitting: Family Medicine

## 2019-08-19 DIAGNOSIS — I10 Essential (primary) hypertension: Secondary | ICD-10-CM

## 2019-08-19 NOTE — Telephone Encounter (Signed)
Requested Prescriptions  Pending Prescriptions Disp Refills   amLODipine (NORVASC) 5 MG tablet [Pharmacy Med Name: AMLODIPINE BESYLATE 5MG  TABLETS] 90 tablet 1    Sig: TAKE 1 TABLET(5 MG) BY MOUTH DAILY     Cardiovascular:  Calcium Channel Blockers Failed - 08/19/2019  3:51 AM      Failed - Last BP in normal range    BP Readings from Last 1 Encounters:  06/21/19 (!) 145/79         Passed - Valid encounter within last 6 months    Recent Outpatient Visits          5 months ago Essential hypertension   [facility] Ranell Patrick, MD   8 months ago Rash and nonspecific skin eruption   [facility], Ranell Patrick, MD   8 months ago Rash and nonspecific skin eruption   [facility], Ranell Patrick, MD   3 years ago Need for prophylactic vaccination and inoculation against influenza   [facility] at [place], [name]   4 years ago Bell's palsy   [facility], Loura Back, MD

## 2019-09-17 ENCOUNTER — Other Ambulatory Visit: Payer: Medicare Other

## 2019-09-17 ENCOUNTER — Other Ambulatory Visit: Payer: Self-pay

## 2019-09-17 ENCOUNTER — Ambulatory Visit: Payer: Medicare Other

## 2019-09-17 DIAGNOSIS — I10 Essential (primary) hypertension: Secondary | ICD-10-CM

## 2019-09-17 DIAGNOSIS — F17200 Nicotine dependence, unspecified, uncomplicated: Secondary | ICD-10-CM

## 2019-09-17 DIAGNOSIS — IMO0001 Reserved for inherently not codable concepts without codable children: Secondary | ICD-10-CM

## 2019-09-17 DIAGNOSIS — Z79899 Other long term (current) drug therapy: Secondary | ICD-10-CM

## 2019-09-17 DIAGNOSIS — N529 Male erectile dysfunction, unspecified: Secondary | ICD-10-CM

## 2019-09-17 DIAGNOSIS — B20 Human immunodeficiency virus [HIV] disease: Secondary | ICD-10-CM

## 2019-09-18 LAB — T-HELPER CELL (CD4) - (RCID CLINIC ONLY)
CD4 % Helper T Cell: 24 % — ABNORMAL LOW (ref 33–65)
CD4 T Cell Abs: 416 /uL (ref 400–1790)

## 2019-09-19 LAB — CBC WITH DIFFERENTIAL/PLATELET
Absolute Monocytes: 558 cells/uL (ref 200–950)
Basophils Absolute: 30 cells/uL (ref 0–200)
Basophils Relative: 0.5 %
Eosinophils Absolute: 120 cells/uL (ref 15–500)
Eosinophils Relative: 2 %
HCT: 52.8 % — ABNORMAL HIGH (ref 38.5–50.0)
Hemoglobin: 17.8 g/dL — ABNORMAL HIGH (ref 13.2–17.1)
Lymphs Abs: 1824 cells/uL (ref 850–3900)
MCH: 31.6 pg (ref 27.0–33.0)
MCHC: 33.7 g/dL (ref 32.0–36.0)
MCV: 93.6 fL (ref 80.0–100.0)
MPV: 10.6 fL (ref 7.5–12.5)
Monocytes Relative: 9.3 %
Neutro Abs: 3468 cells/uL (ref 1500–7800)
Neutrophils Relative %: 57.8 %
Platelets: 225 10*3/uL (ref 140–400)
RBC: 5.64 10*6/uL (ref 4.20–5.80)
RDW: 13.4 % (ref 11.0–15.0)
Total Lymphocyte: 30.4 %
WBC: 6 10*3/uL (ref 3.8–10.8)

## 2019-09-19 LAB — LIPID PANEL
Cholesterol: 146 mg/dL (ref <200)
HDL: 41 mg/dL (ref >=40)
LDL Cholesterol (Calc): 85 mg/dL (calc)
Non-HDL Cholesterol (Calc): 105 mg/dL (calc) (ref <130)
Total CHOL/HDL Ratio: 3.6 (calc) (ref <5.0)
Triglycerides: 104 mg/dL (ref <150)

## 2019-09-19 LAB — COMPLETE METABOLIC PANEL WITH GFR
AG Ratio: 1.5 (calc) (ref 1.0–2.5)
ALT: 17 U/L (ref 9–46)
AST: 16 U/L (ref 10–35)
Albumin: 4.3 g/dL (ref 3.6–5.1)
Alkaline phosphatase (APISO): 74 U/L (ref 35–144)
BUN: 11 mg/dL (ref 7–25)
CO2: 28 mmol/L (ref 20–32)
Calcium: 9.4 mg/dL (ref 8.6–10.3)
Chloride: 103 mmol/L (ref 98–110)
Creat: 1.18 mg/dL (ref 0.70–1.25)
GFR, Est African American: 74 mL/min/{1.73_m2} (ref >=60)
GFR, Est Non African American: 63 mL/min/{1.73_m2} (ref >=60)
Globulin: 2.8 g/dL (calc) (ref 1.9–3.7)
Glucose, Bld: 94 mg/dL (ref 65–99)
Potassium: 4.7 mmol/L (ref 3.5–5.3)
Sodium: 137 mmol/L (ref 135–146)
Total Bilirubin: 0.5 mg/dL (ref 0.2–1.2)
Total Protein: 7.1 g/dL (ref 6.1–8.1)

## 2019-09-19 LAB — RPR: RPR Ser Ql: REACTIVE — AB

## 2019-09-19 LAB — RPR TITER: RPR Titer: 1:8 {titer} — ABNORMAL HIGH

## 2019-09-19 LAB — FLUORESCENT TREPONEMAL AB(FTA)-IGG-BLD: Fluorescent Treponemal ABS: REACTIVE — AB

## 2019-09-19 LAB — HIV-1 RNA QUANT-NO REFLEX-BLD
HIV 1 RNA Quant: <20 Copies/mL
HIV-1 RNA Quant, Log: <1.3 Log cps/mL

## 2019-09-20 ENCOUNTER — Telehealth: Payer: Self-pay

## 2019-09-20 NOTE — Telephone Encounter (Signed)
-----   Message from Irving Copas., MD sent at 09/20/2019  5:38 AM EDT ----- YF,Thanks for reaching out.He has been hard to get a hold of.Raksha Wolfgang, please get this patient on for colonoscopy for follow-up of his invasive cancer that we resected endoscopically and get him on for a colonoscopy with EMR 90-minute slot.Thanks.GM ----- Message ----- From: Truitt Merle, MD Sent: 09/18/2019  10:57 AM EDT To: Jonnie Finner, RN, Alla Feeling, NP, #  Hi Gabe,  Do you plan to see him back with colonoscopy soon? Last one about 3 months ago. He refused hemicolectomy but agreed with close surveillance. We are seeing him back in 3 months from now.  Thanks   Krista Blue

## 2019-09-20 NOTE — Telephone Encounter (Signed)
Mansouraty, Telford Nab., MD  Truitt Merle, MD; Alla Feeling, NP; Jonnie Finner, RN; Timothy Lasso, RN; Havery Moros Carlota Raspberry, MD YF, Thanks for reaching out. He has been hard to get a hold of. Demarious Kapur, please get this patient on for colonoscopy for follow-up of his invasive cancer that we resected endoscopically and get him on for a colonoscopy with EMR 90-minute slot. Thanks. GM

## 2019-09-20 NOTE — Telephone Encounter (Signed)
Left message on machine to call back  

## 2019-09-21 NOTE — Telephone Encounter (Signed)
Left message on machine to call back  

## 2019-10-01 NOTE — Telephone Encounter (Signed)
Unable to reach pt will mail letter  

## 2019-10-15 ENCOUNTER — Encounter: Payer: Self-pay | Admitting: Infectious Disease

## 2019-11-09 ENCOUNTER — Telehealth: Payer: Self-pay | Admitting: Family Medicine

## 2019-11-09 NOTE — Telephone Encounter (Signed)
Yes, I see that, but he was newly diagnosed with colon cancer, and there was a plan for 36-month follow-up colonoscopy.  Per Dr. Donneta Romberg note May 24th: "I will plan to see him at a 79-month interval for endoscopic surveillance in the region".   I received a message from Dr. Rush Landmark as they have not heard from him to schedule that procedure.  If he has questions, I would recommend he discuss with gastroenterology.  Thank you.

## 2019-11-09 NOTE — Telephone Encounter (Signed)
Received message from gastroenterology, patient has not set up colonoscopy as planned.  Has he heard from their office?  Is there anything we can do to assist with getting that scheduled?  Thanks.

## 2019-11-09 NOTE — Telephone Encounter (Signed)
Pt called back stated he had colonoscopy already reviewed chart found visit 06/13/2019 he was admitted post procedure for GI bleeding note in chart.

## 2019-11-09 NOTE — Telephone Encounter (Signed)
Called and LM for pt to return call to office

## 2019-11-12 NOTE — Telephone Encounter (Signed)
Called no answer unable to leave VM

## 2019-11-13 NOTE — Telephone Encounter (Signed)
Called again no answer  

## 2019-11-14 NOTE — Telephone Encounter (Signed)
Pt was calling back and asked for [facility]. Pt would like a call back when possible. Please advise.

## 2019-11-15 ENCOUNTER — Other Ambulatory Visit: Payer: Self-pay | Admitting: Family Medicine

## 2019-11-15 DIAGNOSIS — I10 Essential (primary) hypertension: Secondary | ICD-10-CM

## 2019-11-15 NOTE — Telephone Encounter (Signed)
Pt needs an OV for additional refills. Refilled medication for 30 days until pt can an appointment.

## 2019-11-15 NOTE — Telephone Encounter (Signed)
Requested medication (s) are due for refill today: yes  Requested medication (s) are on the active medication list: yes  Last refill:  08/19/19  Future visit scheduled: no  Notes to clinic: no valid encounter within last 6 months    Requested Prescriptions  Pending Prescriptions Disp Refills   amLODipine (NORVASC) 5 MG tablet [Pharmacy Med Name: AMLODIPINE BESYLATE 5MG  TABLETS] 90 tablet 0    Sig: TAKE 1 TABLET(5 MG) BY MOUTH DAILY      Cardiovascular:  Calcium Channel Blockers Failed - 11/15/2019  3:48 AM      Failed - Last BP in normal range    BP Readings from Last 1 Encounters:  06/21/19 (!) 145/79          Failed - Valid encounter within last 6 months    Recent Outpatient Visits           8 months ago Essential hypertension   [facility] [place], MD   10 months ago Rash and nonspecific skin eruption   [facility], Ranell Patrick, MD   11 months ago Rash and nonspecific skin eruption   [facility], Ranell Patrick, MD   4 years ago Need for prophylactic vaccination and inoculation against influenza   [facility] at [place], [name]   4 years ago Bell's palsy   [facility], Loura Back, MD

## 2019-11-15 NOTE — Telephone Encounter (Signed)
Called pt. Pt stated he will call when he runs out. He stated that he still had medications left.

## 2019-11-16 NOTE — Telephone Encounter (Signed)
Finally able to speak with pt he reports he has a follow up in February and he pushed back about sooner follow up stated he doesn't have a ride to take him to and from the procedure, I do not know of any resources myself that he could use. Also stated that Dr Carmin Muskrat told him that he was positive he was able to remove it all. Pt states he is willing to just doesn't have a ride I told pt I would look into resources for a potential ride to and from procedure so that he will go. Also has an appointment to see you 28th.

## 2019-11-29 ENCOUNTER — Other Ambulatory Visit: Payer: Self-pay

## 2019-11-29 ENCOUNTER — Encounter: Payer: Self-pay | Admitting: Family Medicine

## 2019-11-29 ENCOUNTER — Ambulatory Visit (INDEPENDENT_AMBULATORY_CARE_PROVIDER_SITE_OTHER): Payer: Medicare Other | Admitting: Family Medicine

## 2019-11-29 VITALS — BP 151/83 | HR 85 | Temp 98.0°F | Ht 70.0 in | Wt 176.0 lb

## 2019-11-29 DIAGNOSIS — I10 Essential (primary) hypertension: Secondary | ICD-10-CM | POA: Diagnosis not present

## 2019-11-29 DIAGNOSIS — C189 Malignant neoplasm of colon, unspecified: Secondary | ICD-10-CM | POA: Diagnosis not present

## 2019-11-29 DIAGNOSIS — Z23 Encounter for immunization: Secondary | ICD-10-CM | POA: Diagnosis not present

## 2019-11-29 MED ORDER — AMLODIPINE BESYLATE 10 MG PO TABS: 10.0000 mg | ORAL_TABLET | Freq: Every day | ORAL | 2 refills | Status: DC

## 2019-11-29 NOTE — Patient Instructions (Addendum)
  Try taking 10mg  dose of amlodipine (2 of the 5mg ) for now. If tolerated - fill new rx for 10mg  pill once per day for improved blood pressure control. If new side effects, let me know.   It does appear that colonoscopy recommended at 3-6 months (which would be in  November). Please discuss this plan with Dr. Burr Medico at upcoming appointment. Let me know if anything needed from me.    If you have lab work done today you will be contacted with your lab results within the next 2 weeks.  If you have not heard from [place] then please contact us. The fastest way to get your results is to register for My Chart.   IF you received an x-ray today, you will receive an invoice from [facility]. Please contact [facility] at [phone] with questions or concerns regarding your invoice.   IF you received labwork today, you will receive an invoice from [place]. Please contact [facility] at [phone] with questions or concerns regarding your invoice.   Our billing staff will not be able to assist you with questions regarding bills from these companies.  You will be contacted with the lab results as soon as they are available. The fastest way to get your results is to activate your My Chart account. Instructions are located on the last page of this paperwork. If you have not heard from [place] regarding the results in 2 weeks, please contact this office.

## 2019-11-29 NOTE — Progress Notes (Signed)
Subjective:  Patient ID: [name], male    DOB: 01-04-1951  Age: 69 y.o. MRN: [id number]  CC:  Chief Complaint  Patient presents with  . Hypertension    Pt reports no ISsues with this condition and no physical symptoms of this condition/ PT reports he checks his BP once a week and gets normal reading most of the time has had a few systolic pressures in the 140's, but not offten. PT states medications seem to be working well for him with no known side effects.Pt isn't currently fasting.    HPI [name] presents for   Hypertension: amlodipine 5mg  qd.  Home readings: 120-142/70-80. Only occasional 140.  BP Readings from Last 3 Encounters:  11/29/19 (!) 151/83  06/21/19 (!) 145/79  06/14/19 132/79   Lab Results  Component Value Date   CREATININE 1.18 09/17/2019  lipids, cmp noted 2 months ago. Planned labs at oncology next month.   HIV disease Followed by infectious disease, Dr. Tommy Medal.  Treated with Jorje Guild daily. appt in February.   Colon cancer: Gastroenterologists -  Dr. Havery Moros, [facility]  oncology Dr. Burr Medico Seen by Cira Rue on May 20. For screening colonoscopy February 9, CT February 24 showing 3.5 cm cecal mass corresponding to abnormalities on colonoscopy.  Repeat colonoscopy May 12 with 45 to 55 mm polyp removed with piecemeal mucosal resection.  Admitted for bleeding after the procedure.  Poorly differentiated invasive adenocarcinoma.  Staged PT1NX.  Repeat imaging May 18 without metastatic disease in the chest, CEA on 518 was 4.  Early stage disease.  Risk for local current recurrence has been discussed.  Not interested in surgery.  Close surveillance planned.  Initial plan for repeat colonoscopy at 3-81months, 105-month follow-up with Dr. Burr Medico and gastroenterology for CBC, CMP, CEA.   Some difficulty with ride after colonoscopy- has app with Dr. Burr Medico next month.   History Patient Active Problem List   Diagnosis Date Noted  . Acute upper GI bleed  06/13/2019  . Cecal polyp 05/26/2019  . History of colonic polyps 05/26/2019  . Abnormal CT scan, colon 05/26/2019  . Syphilis 04/19/2018  . Secondary polycythemia 05/26/2016  . Pruritic rash 04/21/2015  . Erectile dysfunction 03/02/2012  . Allergic conjunctivitis 03/02/2012  . HTN (hypertension) 10/06/2010  . Smoking 10/06/2010  . BPH (benign prostatic hyperplasia) 10/06/2010  . COLONIC POLYPS 10/17/2008  . CYSTS OF ORAL SOFT TISSUES 10/17/2008  . DIVERTICULITIS OF COLON 10/17/2008  . Human immunodeficiency virus (HIV) disease ([place]) 06/06/2008  . THRUSH 06/06/2008  . Pancytopenia ([place]) 06/06/2008  . LUNG NODULE 06/06/2008  . RENAL CALCULUS 06/06/2008   Past Medical History:  Diagnosis Date  . BPH (benign prostatic hyperplasia)   . Erectile dysfunction   . History of kidney stones   . HIV infection ([place])   . Hypertension   . Pruritic rash 04/21/2015  . Secondary polycythemia 05/26/2016  . Syphilis 04/19/2018   Past Surgical History:  Procedure Laterality Date  . COLONOSCOPY    . COLONOSCOPY WITH PROPOFOL N/A 06/13/2019   Procedure: COLONOSCOPY WITH PROPOFOL;  Surgeon: Rush Landmark Telford Nab., MD;  Location: [facility];  Service: Gastroenterology;  Laterality: N/A;  . ENDOSCOPIC MUCOSAL RESECTION N/A 06/13/2019   Procedure: ENDOSCOPIC MUCOSAL RESECTION;  Surgeon: Rush Landmark Telford Nab., MD;  Location: [place];  Service: Gastroenterology;  Laterality: N/A;  . HEMOSTASIS CLIP PLACEMENT  06/13/2019   Procedure: HEMOSTASIS CLIP PLACEMENT;  Surgeon: Irving Copas., MD;  Location: [facility];  Service: Gastroenterology;;  .  HEMOSTASIS CONTROL  06/13/2019   Procedure: HEMOSTASIS CONTROL;  Surgeon: Rush Landmark Telford Nab., MD;  Location: [facility];  Service: Gastroenterology;;  . Chrystine Oiler SELECTIVE EACH ADDITIONAL VESSEL  06/13/2019  . IR ANGIOGRAM VISCERAL SELECTIVE  06/13/2019  . IR EMBO ART  VEN HEMORR LYMPH EXTRAV  INC GUIDE ROADMAPPING  06/13/2019  . IR US  GUIDE VASC ACCESS RIGHT  06/13/2019  . PARTIAL COLECTOMY    . SCLEROTHERAPY  06/13/2019   Procedure: Clide Deutscher;  Surgeon: Mansouraty, Telford Nab., MD;  Location: [facility];  Service: Gastroenterology;;  . Lia Foyer LIFTING INJECTION  06/13/2019   Procedure: SUBMUCOSAL LIFTING INJECTION;  Surgeon: Irving Copas., MD;  Location: [place];  Service: Gastroenterology;;   No Known Allergies Prior to Admission medications   Medication Sig Start Date End Date Taking? Authorizing Provider  amLODipine (NORVASC) 5 MG tablet TAKE 1 TABLET(5 MG) BY MOUTH DAILY 11/15/19  Yes Wendie Agreste, MD  elvitegravir-cobicistat-emtricitabine-tenofovir (GENVOYA) 150-150-200-10 MG TABS tablet TAKE 1 TABLET BY MOUTH EVERY DAY WITH BREAKFAST Patient taking differently: Take 1 tablet by mouth daily with breakfast. TAKE 1 TABLET BY MOUTH EVERY DAY WITH BREAKFAST 03/27/19  Yes Tommy Medal, Lavell Islam, MD   Social History   Socioeconomic History  . Marital status: Single    Spouse name: Not on file  . Number of children: 0  . Years of education: Not on file  . Highest education level: Not on file  Occupational History  . Occupation: retired  Tobacco Use  . Smoking status: Current Every Day Smoker    Packs/day: 1.00    Types: Cigarettes    Start date: 08/01/2016  . Smokeless tobacco: Never Used  . Tobacco comment: tobacco info given  Vaping Use  . Vaping Use: Never used  Substance and Sexual Activity  . Alcohol use: Yes    Alcohol/week: 0.0 standard drinks    Comment: social  . Drug use: No  . Sexual activity: Never    Comment: given condoms  Other Topics Concern  . Not on file  Social History Narrative  . Not on file   Social Determinants of Health   Financial Resource Strain:   . Difficulty of Paying Living Expenses: Not on file  Food Insecurity:   . Worried About Charity fundraiser in the Last Year: Not on file  . Ran Out of Food in the Last Year: Not on file  Transportation  Needs:   . Lack of Transportation (Medical): Not on file  . Lack of Transportation (Non-Medical): Not on file  Physical Activity:   . Days of Exercise per Week: Not on file  . Minutes of Exercise per Session: Not on file  Stress:   . Feeling of Stress : Not on file  Social Connections:   . Frequency of Communication with Friends and Family: Not on file  . Frequency of Social Gatherings with Friends and Family: Not on file  . Attends Religious Services: Not on file  . Active Member of Clubs or Organizations: Not on file  . Attends Archivist Meetings: Not on file  . Marital Status: Not on file  Intimate Partner Violence:   . Fear of Current or Ex-Partner: Not on file  . Emotionally Abused: Not on file  . Physically Abused: Not on file  . Sexually Abused: Not on file    Review of Systems  Constitutional: Negative for fatigue and unexpected weight change.  Eyes: Negative for visual disturbance.  Respiratory: Negative for cough,  chest tightness and shortness of breath.   Cardiovascular: Negative for chest pain, palpitations and leg swelling.  Gastrointestinal: Negative for abdominal pain and blood in stool.  Neurological: Negative for dizziness, light-headedness and headaches.     Objective:   Vitals:   11/29/19 1115  BP: (!) 151/83  Pulse: 85  Temp: 98 F (36.7 C)  TempSrc: Temporal  Weight: 176 lb (79.8 kg)  Height: 5\' 10"  (1.778 m)     Physical Exam Vitals reviewed.  Constitutional:      Appearance: He is well-developed.  HENT:     Head: Normocephalic and atraumatic.  Eyes:     Pupils: Pupils are equal, round, and reactive to light.  Neck:     Vascular: No carotid bruit or JVD.  Cardiovascular:     Rate and Rhythm: Normal rate and regular rhythm.     Heart sounds: Normal heart sounds. No murmur heard.   Pulmonary:     Effort: Pulmonary effort is normal.     Breath sounds: Normal breath sounds. No rales.  Skin:    General: Skin is warm and dry.    Neurological:     Mental Status: He is alert and oriented to person, place, and time.        Assessment & Plan:  [name] is a 69 y.o. male . Essential hypertension - Plan: amLODipine (NORVASC) 10 MG tablet  - decreased control - trial of 10mg  dose. rtc precautions. Potential side effects discussed.   Need for vaccination - Plan: Pneumococcal polysaccharide vaccine 23-valent greater than or equal to 2yo subcutaneous/IM  Adenocarcinoma, colon ([place])  -Reviewed previous treatment plan and recommendations.  Has follow-up appointment with oncology next month.  Did recommend repeat colonoscopy, but can discuss that plan further at upcoming visit.  Meds ordered this encounter  Medications  . amLODipine (NORVASC) 10 MG tablet    Sig: Take 1 tablet (10 mg total) by mouth daily.    Dispense:  90 tablet    Refill:  2   Patient Instructions    Try taking 10mg  dose of amlodipine (2 of the 5mg ) for now. If tolerated - fill new rx for 10mg  pill once per day for improved blood pressure control. If new side effects, let me know.   It does appear that colonoscopy recommended at 3-6 months (which would be in  November). Please discuss this plan with Dr. Burr Medico at upcoming appointment. Let me know if anything needed from me.    If you have lab work done today you will be contacted with your lab results within the next 2 weeks.  If you have not heard from [place] then please contact us. The fastest way to get your results is to register for My Chart.   IF you received an x-ray today, you will receive an invoice from [facility]. Please contact [facility]iology at [phone] with questions or concerns regarding your invoice.   IF you received labwork today, you will receive an invoice from [place]. Please contact [facility] at [phone] with questions or concerns regarding your invoice.   Our billing staff will not be able to assist you with questions regarding bills from these  companies.  You will be contacted with the lab results as soon as they are available. The fastest way to get your results is to activate your My Chart account. Instructions are located on the last page of this paperwork. If you have not heard from [place] regarding the results in 2 weeks, please  contact this office.         Signed, Merri Ray, MD [facility]

## 2019-12-14 ENCOUNTER — Other Ambulatory Visit: Payer: Self-pay | Admitting: Family Medicine

## 2019-12-14 DIAGNOSIS — I10 Essential (primary) hypertension: Secondary | ICD-10-CM

## 2019-12-21 NOTE — Progress Notes (Signed)
[place]   Telephone:([phone]) [phone] Fax:(336) [phone]   Clinic Follow up Note   Patient Care Team: Wendie Agreste, MD as PCP - General (Family Medicine) Tommy Medal, Lavell Islam, MD as PCP - Infectious Diseases (Infectious Diseases) Jonnie Finner, RN as Oncology Nurse Navigator Truitt Merle, MD as Consulting Physician (Hematology) Alla Feeling, NP as Nurse Practitioner (Nurse Practitioner) Mansouraty, Telford Nab., MD as Consulting Physician (Gastroenterology)  Date of Service:  12/24/2019  CHIEF COMPLAINT: F/u of colon cancer  SUMMARY OF ONCOLOGIC HISTORY: Oncology History Overview Note  Cancer Staging Cancer of right colon [facility]) Staging form: Colon and Rectum, AJCC 8th Edition - Clinical stage from 06/13/2019: Stage I (cT1, cN0, cM0) - Signed by Truitt Merle, MD on 12/23/2019    Cancer of right colon ([place])  03/13/2019 Procedure   Colonoscopy by Dr Havery Moros IMPRESSION - The examined portion of the ileum was normal. - Polypoid lesion in the cecum overlying the appendiceal orifice as outlined. Biopsied. - Diverticulosis in the sigmoid colon and in the cecum. - Internal hemorrhoids. - The examination was otherwise normal.     Diagnosis Surgical [P], colon, cecum polyp/polypoid lesion - SUPERFICIAL FRAGMENTS OF TUBULOVILLOUS ADENOMA WITH FOCAL HIGH-GRADE DYSPLASIA - NEGATIVE FOR CARCINOMA   03/28/2019 Imaging   CT AP W contrast  IMPRESSION: 1. 3.5 cm cecal mass, corresponding to the colonoscopy abnormality. No pericolonic extension or adjacent adenopathy. 2. Hepatic morphology which is suspicious for mild cirrhosis. Recannulized paraumbilical vein, suggesting portal venous hypertension. 3.  Aortic Atherosclerosis (ICD10-I70.0). 4. Mild prostatomegaly.   06/13/2019 Cancer Staging   Staging form: Colon and Rectum, AJCC 8th Edition - Clinical stage from 06/13/2019: Stage I (cT1, cN0, cM0) - Signed by Truitt Merle, MD on 12/23/2019   06/13/2019 Procedure     Colonoscopy for polypectomy by Dr Rush Landmark  IMPRESSION - Hemorrhoids found on digital rectal exam. - The examined portion of the ileum was normal. - One 45-50 mm polyp in the cecum, removed with piecemeal mucosal resection. Resected and retrieved. Bleeding was noted during the procedure. Injected with Epinephrine as above. Clips (MR conditional) were placed. Persistent oozing noted. Hemostatic spray applied. - Diverticulosis in the recto-sigmoid colon, in the sigmoid colon, in the descending colon and in the ascending colon. - Non-bleeding non-thrombosed external and internal hemorrhoids   06/13/2019 Initial Biopsy   FINAL MICROSCOPIC DIAGNOSIS:   A. COLON, CECUM, POLYPECTOMY:  - Invasive adenocarcinoma, poorly differentiated, scattered small foci.  - Adenocarcinoma extends into superficial submucosa (depth of 0.3 cm).  - The surgical resection margins are negative for carcinoma.  - See oncology table below.    Mismatch Repair Protein (IHC)   SUMMARY INTERPRETATION: NORMAL    IHC EXPRESSION RESULTS   TEST           RESULT  MLH1:          Preserved nuclear expression  MSH2:          Preserved nuclear expression  MSH6:          Preserved nuclear expression  PMS2:          Preserved nuclear expression    06/19/2019 Imaging   CT Chest  IMPRESSION: 1. No evidence of metastatic disease in the chest. 2. Finely irregular liver surface suggests cirrhosis. 3. Aortic Atherosclerosis (ICD10-I70.0) and Emphysema (ICD10-J43.9).       12/23/2019 Initial Diagnosis   Cancer of right colon [facility])      CURRENT THERAPY:  Observation  INTERVAL HISTORY:  [name]  L Dark is here for a follow up of colon cancer. She was last seen by our clinic 6 months ago. She presents to the clinic alone. He notes he is doing well. He denies any issues with BM, pain, bloating, nausea or blood in stool. He denies having any concerns. He notes he was not able to proceed with colonoscopy as he does not  have anyone to take him. Family he has is out of state. He denies being reached by Dr Donneta Romberg office about his colonoscopy. He notes he his first 2 he was taking by someone but not able to use them again. He notes he has not wanted to proceed with surgery given he has no one to help take care of him after surgery.      REVIEW OF SYSTEMS:   Constitutional: Denies fevers, chills or abnormal weight loss Eyes: Denies blurriness of vision Ears, nose, mouth, throat, and face: Denies mucositis or sore throat Respiratory: Denies cough, dyspnea or wheezes Cardiovascular: Denies palpitation, chest discomfort or lower extremity swelling Gastrointestinal:  Denies nausea, heartburn or change in bowel habits Skin: Denies abnormal skin rashes Lymphatics: Denies new lymphadenopathy or easy bruising Neurological:Denies numbness, tingling or new weaknesses Behavioral/Psych: Mood is stable, no new changes  All other systems were reviewed with the patient and are negative.  MEDICAL HISTORY:  Past Medical History:  Diagnosis Date  . BPH (benign prostatic hyperplasia)   . Erectile dysfunction   . History of kidney stones   . HIV infection ([place])   . Hypertension   . Pruritic rash 04/21/2015  . Secondary polycythemia 05/26/2016  . Syphilis 04/19/2018    SURGICAL HISTORY: Past Surgical History:  Procedure Laterality Date  . COLONOSCOPY    . COLONOSCOPY WITH PROPOFOL N/A 06/13/2019   Procedure: COLONOSCOPY WITH PROPOFOL;  Surgeon: Rush Landmark Telford Nab., MD;  Location: [place];  Service: Gastroenterology;  Laterality: N/A;  . ENDOSCOPIC MUCOSAL RESECTION N/A 06/13/2019   Procedure: ENDOSCOPIC MUCOSAL RESECTION;  Surgeon: Rush Landmark Telford Nab., MD;  Location: [place];  Service: Gastroenterology;  Laterality: N/A;  . HEMOSTASIS CLIP PLACEMENT  06/13/2019   Procedure: HEMOSTASIS CLIP PLACEMENT;  Surgeon: Irving Copas., MD;  Location: [place];  Service: Gastroenterology;;  .  HEMOSTASIS CONTROL  06/13/2019   Procedure: HEMOSTASIS CONTROL;  Surgeon: Irving Copas., MD;  Location: [place];  Service: Gastroenterology;;  . Chrystine Oiler SELECTIVE EACH ADDITIONAL VESSEL  06/13/2019  . IR ANGIOGRAM VISCERAL SELECTIVE  06/13/2019  . IR EMBO ART  VEN HEMORR LYMPH EXTRAV  INC GUIDE ROADMAPPING  06/13/2019  . IR US GUIDE VASC ACCESS RIGHT  06/13/2019  . PARTIAL COLECTOMY    . SCLEROTHERAPY  06/13/2019   Procedure: Clide Deutscher;  Surgeon: Mansouraty, Telford Nab., MD;  Location: [place];  Service: Gastroenterology;;  . Lia Foyer LIFTING INJECTION  06/13/2019   Procedure: SUBMUCOSAL LIFTING INJECTION;  Surgeon: Irving Copas., MD;  Location: [place];  Service: Gastroenterology;;    I have reviewed the social history and family history with the patient and they are unchanged from previous note.  ALLERGIES:  has No Known Allergies.  MEDICATIONS:  Current Outpatient Medications  Medication Sig Dispense Refill  . amLODipine (NORVASC) 10 MG tablet Take 1 tablet (10 mg total) by mouth daily. 90 tablet 2  . elvitegravir-cobicistat-emtricitabine-tenofovir (GENVOYA) 150-150-200-10 MG TABS tablet TAKE 1 TABLET BY MOUTH EVERY DAY WITH BREAKFAST (Patient taking differently: Take 1 tablet by mouth daily with breakfast. TAKE 1 TABLET BY MOUTH EVERY DAY WITH BREAKFAST) 30  tablet 11   No current facility-administered medications for this visit.    PHYSICAL EXAMINATION: ECOG PERFORMANCE STATUS: 0 - Asymptomatic  Vitals:   12/24/19 0849  BP: 136/77  Pulse: 72  Resp: 18  Temp: (!) 97.4 F (36.3 C)  SpO2: 100%   Filed Weights   12/24/19 0849  Weight: 173 lb (78.5 kg)    GENERAL:alert, no distress and comfortable SKIN: skin color, texture, turgor are normal, no rashes or significant lesions EYES: normal, Conjunctiva are pink and non-injected, sclera clear  NECK: supple, thyroid normal size, non-tender, without nodularity LYMPH:  no palpable  lymphadenopathy in the cervical, axillary  LUNGS: clear to auscultation and percussion with normal breathing effort HEART: regular rate & rhythm and no murmurs and no lower extremity edema ABDOMEN:abdomen soft, non-tender and normal bowel sounds Musculoskeletal:no cyanosis of digits and no clubbing  NEURO: alert & oriented x 3 with fluent speech, no focal motor/sensory deficits  LABORATORY DATA:  I have reviewed the data as listed CBC Latest Ref Rng & Units 12/24/2019 09/17/2019 06/19/2019  WBC 4.0 - 10.5 K/uL 7.1 6.0 6.0  Hemoglobin 13.0 - 17.0 g/dL 16.9 17.8(H) 12.7(L)  Hematocrit 39 - 52 % 49.8 52.8(H) 36.2(L)  Platelets 150 - 400 K/uL 296 225 268.0     CMP Latest Ref Rng & Units 12/24/2019 09/17/2019 06/19/2019  Glucose 70 - 99 mg/dL 104(H) 94 95  BUN 8 - 23 mg/dL $Remove'11 11 9  'QQMhZvi$ Creatinine 0.61 - 1.24 mg/dL 1.18 1.18 0.95  Sodium 135 - 145 mmol/L 136 137 135  Potassium 3.5 - 5.1 mmol/L 4.2 4.7 3.6  Chloride 98 - 111 mmol/L 104 103 103  CO2 22 - 32 mmol/L $RemoveB'24 28 27  'tezovAYF$ Calcium 8.9 - 10.3 mg/dL 9.5 9.4 8.5  Total Protein 6.5 - 8.1 g/dL 7.7 7.1 -  Total Bilirubin 0.3 - 1.2 mg/dL 0.7 0.5 -  Alkaline Phos 38 - 126 U/L 93 - -  AST 15 - 41 U/L 21 16 -  ALT 0 - 44 U/L 27 17 -      RADIOGRAPHIC STUDIES: I have personally reviewed the radiological images as listed and agreed with the findings in the report. No results found.   ASSESSMENT & PLAN:  HILMAR MOLDOVAN is a 69 y.o. male with    1. Adenocarcinoma of the cecum, poorly differentiated, pT1NXM0, stage I, MMR normal  -He has a large cecal polyp on first screening colonoscopy in 03/2019 which was negative for malignancy, repeat colonoscopy 3 months later in 06/2019 showed multiple foci of poorly differentiated adenocarcinoma extending into superficial submucosa to a depth of 0.3, when it was resected by polypectomy. CT is negative for metastatic disease.  -Pt declined surgery with hemicolectomy due to lack of support after surgery. -He  understands he will need close surveillance going forward. He will f/u with Dr. Rush Landmark with repeat colonoscopy in late 2021. He does not have anyone who can bring him in for procedure, so he has not had colonoscopy since May 2021.  I will reach out to our social worker, to see if we can find a boarding to to help him with colonoscopy procedure. -we plan to repeat surveillance CT by May 2022.  -He is clinically stable. Labs reviewed, CBC and CMP WNL except BG 104. CEA still pending. Physical exam unremarkable -F/u in 4 months.    2. HIV -diagnosed 1984, controlled per patient  -on Genvoya per Dr. Tommy Medal   PLAN: -F/u in 4 months with lab and  CT AP W contrast a few days before.  -Copy note to SW and Dr Rush Landmark to see if we can find a volunteer to bring him in for colonoscopy.     No problem-specific Assessment & Plan notes found for this encounter.   Orders Placed This Encounter  Procedures  . CT Abdomen Pelvis W Contrast    Standing Status:   Future    Standing Expiration Date:   12/23/2020    Order Specific Question:   If indicated for the ordered procedure, I authorize the administration of contrast media per Radiology protocol    Answer:   Yes    Order Specific Question:   Preferred imaging location?    Answer:   [facility]    Order Specific Question:   Release to patient    Answer:   Immediate    Order Specific Question:   Is Oral Contrast requested for this exam?    Answer:   Yes, Per Radiology protocol   All questions were answered. The patient knows to call the clinic with any problems, questions or concerns. No barriers to learning was detected.      Truitt Merle, MD 12/24/2019   I, Joslyn Devon, am acting as scribe for Truitt Merle, MD.   I have reviewed the above documentation for accuracy and completeness, and I agree with the above.

## 2019-12-23 DIAGNOSIS — C182 Malignant neoplasm of ascending colon: Secondary | ICD-10-CM | POA: Insufficient documentation

## 2019-12-24 ENCOUNTER — Inpatient Hospital Stay: Payer: Medicare Other

## 2019-12-24 ENCOUNTER — Encounter: Payer: Self-pay | Admitting: Hematology

## 2019-12-24 ENCOUNTER — Inpatient Hospital Stay: Payer: Medicare Other | Attending: Hematology | Admitting: Hematology

## 2019-12-24 ENCOUNTER — Other Ambulatory Visit: Payer: Self-pay

## 2019-12-24 DIAGNOSIS — F17210 Nicotine dependence, cigarettes, uncomplicated: Secondary | ICD-10-CM | POA: Insufficient documentation

## 2019-12-24 DIAGNOSIS — C182 Malignant neoplasm of ascending colon: Secondary | ICD-10-CM

## 2019-12-24 DIAGNOSIS — C180 Malignant neoplasm of cecum: Secondary | ICD-10-CM | POA: Diagnosis not present

## 2019-12-24 DIAGNOSIS — Z21 Asymptomatic human immunodeficiency virus [HIV] infection status: Secondary | ICD-10-CM | POA: Insufficient documentation

## 2019-12-24 LAB — CMP (CANCER CENTER ONLY)
ALT: 27 U/L (ref 0–44)
AST: 21 U/L (ref 15–41)
Albumin: 3.9 g/dL (ref 3.5–5.0)
Alkaline Phosphatase: 93 U/L (ref 38–126)
Anion gap: 8 (ref 5–15)
BUN: 11 mg/dL (ref 8–23)
CO2: 24 mmol/L (ref 22–32)
Calcium: 9.5 mg/dL (ref 8.9–10.3)
Chloride: 104 mmol/L (ref 98–111)
Creatinine: 1.18 mg/dL (ref 0.61–1.24)
GFR, Estimated: >60 mL/min (ref >60)
Glucose, Bld: 104 mg/dL — ABNORMAL HIGH (ref 70–99)
Potassium: 4.2 mmol/L (ref 3.5–5.1)
Sodium: 136 mmol/L (ref 135–145)
Total Bilirubin: 0.7 mg/dL (ref 0.3–1.2)
Total Protein: 7.7 g/dL (ref 6.5–8.1)

## 2019-12-24 LAB — CBC WITH DIFFERENTIAL (CANCER CENTER ONLY)
Abs Immature Granulocytes: 0.04 10*3/uL (ref 0.00–0.07)
Basophils Absolute: 0 10*3/uL (ref 0.0–0.1)
Basophils Relative: 0 %
Eosinophils Absolute: 0.1 10*3/uL (ref 0.0–0.5)
Eosinophils Relative: 1 %
HCT: 49.8 % (ref 39.0–52.0)
Hemoglobin: 16.9 g/dL (ref 13.0–17.0)
Immature Granulocytes: 1 %
Lymphocytes Relative: 24 %
Lymphs Abs: 1.7 10*3/uL (ref 0.7–4.0)
MCH: 31.8 pg (ref 26.0–34.0)
MCHC: 33.9 g/dL (ref 30.0–36.0)
MCV: 93.8 fL (ref 80.0–100.0)
Monocytes Absolute: 0.6 10*3/uL (ref 0.1–1.0)
Monocytes Relative: 8 %
Neutro Abs: 4.7 10*3/uL (ref 1.7–7.7)
Neutrophils Relative %: 66 %
Platelet Count: 296 10*3/uL (ref 150–400)
RBC: 5.31 MIL/uL (ref 4.22–5.81)
RDW: 13.3 % (ref 11.5–15.5)
WBC Count: 7.1 10*3/uL (ref 4.0–10.5)
nRBC: 0 % (ref 0.0–0.2)

## 2019-12-24 LAB — CEA (IN HOUSE-CHCC): CEA (CHCC-In House): 5.13 ng/mL — ABNORMAL HIGH (ref 0.00–5.00)

## 2019-12-25 ENCOUNTER — Telehealth: Payer: Self-pay

## 2019-12-25 ENCOUNTER — Encounter: Payer: Self-pay | Admitting: General Practice

## 2019-12-25 NOTE — Telephone Encounter (Signed)
Noted waiting for a call with how to proceed

## 2019-12-25 NOTE — Telephone Encounter (Signed)
-----   Message from Amedeo Kinsman, Cook sent at 12/25/2019  9:26 AM EST ----- I will contact the transportation team to see what is available.  Abigail  ----- Message ----- From: Truitt Merle, MD Sent: 12/25/2019   8:30 AM EST To: Timothy Lasso, RN, Abigail Floydene Flock, LCSW, #  Thanks much.  Pt states he wants to do colonoscopy but he has no family member or friends available to come in with him. His phone number in chart is correct, please reach out to him.  I will keep you posted if we can find an volunteer.   Thanks   Krista Blue  ----- Message ----- From: Irving Copas., MD Sent: 12/25/2019   2:51 AM EST To: Timothy Lasso, RN, Abigail Floydene Flock, LCSW, #  YF,I am glad that he finally followed up.Understand the inability if he does not have someone who can come with him but certainly there are ways of trying to help this as you are stating.Patient can have a family member/friend/volunteer come with him to the hospital and wait in the waiting room and just be available when it is time to be discharged to get him home safely either by them driving or by them or bring together.I will have RN Gerarda Fraction reach out to the patient to see what we can do on Wednesday after your team has had a chance to talk with the patient about any volunteers that may be able to help.Zhania Shaheen,On Wednesday, please reach out to the patient as he is now ready for potential colonoscopy if we can find him a caregiver.If he has a family member or friend or potentially 1 of these volunteers who can come with him and drive him home or Melburn Popper with him back to his home that would be most ideal so that we can get his procedure in the books.It will be a hospital-based 90-minute EMR.Thanks.GM ----- Message ----- From: Truitt Merle, MD Sent: 12/24/2019   7:54 PM EST To: Amedeo Kinsman, LCSW, #  Gabe,  He came in for his f/u with [place] today. He is aware of his colonoscopy is due, but can not do it because he does not have anyone who  can come in with him for procedure, unless you are OK to let him drive and do colonoscopy without any one with him, or is volunteer an acceptable alternative? He said that he has not received phone call from your office lately.   Vernie Shanks and Webb Silversmith, could you help to identify a volunteer who can drive him to his colonoscopy procedure, and stay with him, drives him back afterwards?   Thanks   Krista Blue

## 2019-12-25 NOTE — Telephone Encounter (Signed)
Beverely Pace, LCSW  Mansouraty, Telford Nab., MD; Truitt Merle, MD; Amedeo Kinsman, LCSW; Timothy Lasso, RN Transportation Services says they can provide transport to/from the appointment. They may be able to wait for the 90 minute procedure. I have made the referral to [employer] - when the procedure is scheduled, patient or the colonoscopy provider should let Transportation Services know the time and date of the appointment so arrangements can be made. Transportation Services is reached by calling [phone], Thanks! Edwyna Shell

## 2019-12-25 NOTE — Progress Notes (Signed)
[place] CSW Progress Notes  Email from [employer]  - they can provide transport to/from the appointment. They may be able to wait for patient during the 90 minute procedure.  I have made the referral to [employer] - when the procedure is scheduled, patient or the colonoscopy provider should let Transportation Services know the time and date of the appointment so arrangements can be made.  Transportation Services is reached by calling [place], [place] Worker Phone:  [phone]

## 2019-12-26 NOTE — Telephone Encounter (Signed)
Mansouraty, Telford Nab., MD  Beverely Pace, LCSW; Truitt Merle, MD; Amedeo Kinsman, LCSW; Timothy Lasso, RN [facility], Thanks so much for helping with this. Ramel Tobon please move forward with trying to get the patient set up for the colonoscopy in the hospital-based setting. Please update Dr. Burr Medico and I about the timing of the procedure. Thanks. GM

## 2019-12-27 ENCOUNTER — Other Ambulatory Visit: Payer: Self-pay | Admitting: Hematology

## 2019-12-27 DIAGNOSIS — C182 Malignant neoplasm of ascending colon: Secondary | ICD-10-CM

## 2019-12-28 ENCOUNTER — Telehealth: Payer: Self-pay

## 2019-12-28 ENCOUNTER — Other Ambulatory Visit: Payer: Self-pay

## 2019-12-28 DIAGNOSIS — C182 Malignant neoplasm of ascending colon: Secondary | ICD-10-CM

## 2019-12-28 NOTE — Telephone Encounter (Signed)
I left message for Mr Deskin to return mycall to reviewed tumor marker levels.

## 2019-12-28 NOTE — Telephone Encounter (Signed)
-----   Message from Truitt Merle, MD sent at 12/27/2019 11:43 AM EST ----- Please let pt know his tumor marker was slightly elevated, and I recommend a repeated lab in 4-6 weeks, please set up, thanks   Truitt Merle

## 2020-01-02 NOTE — Telephone Encounter (Signed)
Left message on machine to call back  

## 2020-01-03 ENCOUNTER — Other Ambulatory Visit: Payer: Self-pay

## 2020-01-03 DIAGNOSIS — C801 Malignant (primary) neoplasm, unspecified: Secondary | ICD-10-CM

## 2020-01-03 NOTE — Telephone Encounter (Signed)
Left message on machine to call back  

## 2020-01-03 NOTE — Telephone Encounter (Signed)
Colon scheduled for 02/11/20 at 1115 am at [facility] with Dr Rush Landmark  COVID test on 1/6

## 2020-01-03 NOTE — Telephone Encounter (Signed)
COVID test at 1030 am

## 2020-01-04 NOTE — Telephone Encounter (Signed)
Communication received from the pt via My Chart.  All information has been sent regarding the procedure. He was asked to call with any questions or concerns

## 2020-01-30 ENCOUNTER — Inpatient Hospital Stay: Payer: Medicare Other | Attending: Hematology

## 2020-01-30 ENCOUNTER — Other Ambulatory Visit: Payer: Self-pay

## 2020-01-30 DIAGNOSIS — Z21 Asymptomatic human immunodeficiency virus [HIV] infection status: Secondary | ICD-10-CM | POA: Insufficient documentation

## 2020-01-30 DIAGNOSIS — F17210 Nicotine dependence, cigarettes, uncomplicated: Secondary | ICD-10-CM | POA: Diagnosis not present

## 2020-01-30 DIAGNOSIS — C182 Malignant neoplasm of ascending colon: Secondary | ICD-10-CM

## 2020-01-30 DIAGNOSIS — C180 Malignant neoplasm of cecum: Secondary | ICD-10-CM | POA: Diagnosis not present

## 2020-01-30 LAB — CEA (IN HOUSE-CHCC): CEA (CHCC-In House): 6.12 ng/mL — ABNORMAL HIGH (ref 0.00–5.00)

## 2020-02-05 ENCOUNTER — Telehealth: Payer: Self-pay | Admitting: Nurse Practitioner

## 2020-02-05 DIAGNOSIS — C182 Malignant neoplasm of ascending colon: Secondary | ICD-10-CM

## 2020-02-05 NOTE — Telephone Encounter (Signed)
I spoke to [name] about his rising CEA. He is a smoker. Has colonoscopy scheduled on 02/11/20. He agrees to the recommendation to obtain CT CAP in the next few weeks. Orders have been placed. Will f/up with him after report is back.   Santiago Glad, NP

## 2020-02-07 ENCOUNTER — Other Ambulatory Visit (HOSPITAL_COMMUNITY)
Admission: RE | Admit: 2020-02-07 | Discharge: 2020-02-07 | Disposition: A | Discharge status: Home / Self Care | Payer: Medicare Other | Source: Ambulatory Visit | Attending: Gastroenterology | Admitting: Gastroenterology

## 2020-02-07 DIAGNOSIS — Z01812 Encounter for preprocedural laboratory examination: Secondary | ICD-10-CM | POA: Insufficient documentation

## 2020-02-07 DIAGNOSIS — Z20822 Contact with and (suspected) exposure to covid-19: Secondary | ICD-10-CM | POA: Diagnosis not present

## 2020-02-07 LAB — SARS CORONAVIRUS 2 (TAT 6-24 HRS): SARS Coronavirus 2: NEGATIVE

## 2020-02-08 ENCOUNTER — Encounter (HOSPITAL_COMMUNITY): Payer: Self-pay | Admitting: Gastroenterology

## 2020-02-08 ENCOUNTER — Other Ambulatory Visit: Payer: Self-pay

## 2020-02-08 NOTE — Progress Notes (Signed)
Patient denies shortness of breath, fever, cough or chest pain.  PCP - Dr Merri Ray Cardiologist - n/a Infectious Disease - Dr Rhina Brackett [place] Oncology - Dr Burr Medico  Chest x-ray - n/a, CT Chest 06/19/19 EKG - DOS 02/11/20 Stress Test - n/a ECHO - n/a Cardiac Cath - n/a   STOP now taking any Aspirin (unless otherwise instructed by your surgeon), Aleve, Naproxen, Ibuprofen, Motrin, Advil, Goody's, BC's, all herbal medications, fish oil, and all vitamins.   Coronavirus Screening Covid test on 02/07/19 was negative.  Patient verbalized understanding of instructions that were given

## 2020-02-11 ENCOUNTER — Ambulatory Visit (HOSPITAL_COMMUNITY): Payer: Medicare Other | Admitting: Anesthesiology

## 2020-02-11 ENCOUNTER — Other Ambulatory Visit: Payer: Self-pay

## 2020-02-11 ENCOUNTER — Encounter (HOSPITAL_COMMUNITY)
Admission: RE | Disposition: A | Discharge status: Home / Self Care | Payer: Self-pay | Source: Home / Self Care | Attending: Gastroenterology

## 2020-02-11 ENCOUNTER — Encounter (HOSPITAL_COMMUNITY): Payer: Self-pay | Admitting: Gastroenterology

## 2020-02-11 ENCOUNTER — Ambulatory Visit (HOSPITAL_COMMUNITY)
Admission: RE | Admit: 2020-02-11 | Discharge: 2020-02-11 | Disposition: A | Discharge status: Home / Self Care | Payer: Medicare Other | Attending: Gastroenterology | Admitting: Gastroenterology

## 2020-02-11 DIAGNOSIS — Z85038 Personal history of other malignant neoplasm of large intestine: Secondary | ICD-10-CM

## 2020-02-11 DIAGNOSIS — K6289 Other specified diseases of anus and rectum: Secondary | ICD-10-CM

## 2020-02-11 DIAGNOSIS — K641 Second degree hemorrhoids: Secondary | ICD-10-CM | POA: Diagnosis not present

## 2020-02-11 DIAGNOSIS — C801 Malignant (primary) neoplasm, unspecified: Secondary | ICD-10-CM

## 2020-02-11 DIAGNOSIS — K5730 Diverticulosis of large intestine without perforation or abscess without bleeding: Secondary | ICD-10-CM | POA: Diagnosis not present

## 2020-02-11 DIAGNOSIS — Z1211 Encounter for screening for malignant neoplasm of colon: Secondary | ICD-10-CM | POA: Insufficient documentation

## 2020-02-11 DIAGNOSIS — Z9049 Acquired absence of other specified parts of digestive tract: Secondary | ICD-10-CM | POA: Diagnosis not present

## 2020-02-11 HISTORY — PX: BIOPSY: SHX5522

## 2020-02-11 HISTORY — PX: COLONOSCOPY WITH PROPOFOL: SHX5780

## 2020-02-11 SURGERY — COLONOSCOPY WITH PROPOFOL
Anesthesia: Monitor Anesthesia Care

## 2020-02-11 MED ORDER — PROPOFOL 500 MG/50ML IV EMUL
INTRAVENOUS | Status: DC | PRN
  Administered 2020-02-11: 150 ug/kg/min via INTRAVENOUS

## 2020-02-11 MED ORDER — SODIUM CHLORIDE 0.9 % IV SOLN: INTRAVENOUS | Status: DC

## 2020-02-11 MED ORDER — LACTATED RINGERS IV SOLN: INTRAVENOUS | Status: DC

## 2020-02-11 MED ORDER — LIDOCAINE 2% (20 MG/ML) 5 ML SYRINGE
INTRAMUSCULAR | Status: DC | PRN
  Administered 2020-02-11: 80 mg via INTRAVENOUS

## 2020-02-11 MED ORDER — PROPOFOL 10 MG/ML IV BOLUS
INTRAVENOUS | Status: DC | PRN
  Administered 2020-02-11 (×2): 20 mg via INTRAVENOUS

## 2020-02-11 SURGICAL SUPPLY — 22 items

## 2020-02-11 NOTE — Discharge Instructions (Signed)

## 2020-02-11 NOTE — Anesthesia Preprocedure Evaluation (Signed)
Anesthesia Evaluation  Patient identified by MRN, date of birth, ID band Patient awake    Reviewed: Allergy & Precautions, H&P , NPO status , Patient's Chart, lab work & pertinent test results  Airway Mallampati: II   Neck ROM: full    Dental   Pulmonary Current Smoker and Patient abstained from smoking.,    breath sounds clear to auscultation       Cardiovascular hypertension,  Rhythm:regular Rate:Normal     Neuro/Psych    GI/Hepatic   Endo/Other    Renal/GU stones     Musculoskeletal   Abdominal   Peds  Hematology  (+) HIV,   Anesthesia Other Findings   Reproductive/Obstetrics                             Anesthesia Physical Anesthesia Plan  ASA: II  Anesthesia Plan: MAC   Post-op Pain Management:    Induction: Intravenous  PONV Risk Score and Plan: 1 and Propofol infusion and Treatment may vary due to age or medical condition  Airway Management Planned: Simple Face Mask  Additional Equipment:   Intra-op Plan:   Post-operative Plan:   Informed Consent: I have reviewed the patients History and Physical, chart, labs and discussed the procedure including the risks, benefits and alternatives for the proposed anesthesia with the patient or authorized representative who has indicated his/her understanding and acceptance.     Dental advisory given  Plan Discussed with: Anesthesiologist and CRNA  Anesthesia Plan Comments:         Anesthesia Quick Evaluation

## 2020-02-11 NOTE — H&P (Signed)
GASTROENTEROLOGY PROCEDURE H&P NOTE   Primary Care Physician: Wendie Agreste, MD  HPI: [name] is a 70 y.o. male who presents for Colonoscopy for follow up of Colon Cancer resection s/p EMR.  Patient overdue for surveillance and has rising CEA.  CT scans pending completion next week for first real follow up.  Past Medical History:  Diagnosis Date  . BPH (benign prostatic hyperplasia)   . Erectile dysfunction   . History of kidney stones    passed stones  . HIV infection ([place])   . Hypertension   . Pruritic rash 04/21/2015  . Secondary polycythemia 05/26/2016  . Syphilis 04/19/2018   Past Surgical History:  Procedure Laterality Date  . COLONOSCOPY    . COLONOSCOPY WITH PROPOFOL N/A 06/13/2019   Procedure: COLONOSCOPY WITH PROPOFOL;  Surgeon: Rush Landmark Telford Nab., MD;  Location: [place];  Service: Gastroenterology;  Laterality: N/A;  . ENDOSCOPIC MUCOSAL RESECTION N/A 06/13/2019   Procedure: ENDOSCOPIC MUCOSAL RESECTION;  Surgeon: Rush Landmark Telford Nab., MD;  Location: [place];  Service: Gastroenterology;  Laterality: N/A;  . HEMOSTASIS CLIP PLACEMENT  06/13/2019   Procedure: HEMOSTASIS CLIP PLACEMENT;  Surgeon: Irving Copas., MD;  Location: [place];  Service: Gastroenterology;;  . HEMOSTASIS CONTROL  06/13/2019   Procedure: HEMOSTASIS CONTROL;  Surgeon: Irving Copas., MD;  Location: [place];  Service: Gastroenterology;;  . Chrystine Oiler SELECTIVE EACH ADDITIONAL VESSEL  06/13/2019  . IR ANGIOGRAM VISCERAL SELECTIVE  06/13/2019  . IR EMBO ART  VEN HEMORR LYMPH EXTRAV  INC GUIDE ROADMAPPING  06/13/2019  . IR US GUIDE VASC ACCESS RIGHT  06/13/2019  . PARTIAL COLECTOMY    . SCLEROTHERAPY  06/13/2019   Procedure: Clide Deutscher;  Surgeon: Mansouraty, Telford Nab., MD;  Location: [place];  Service: Gastroenterology;;  . Lia Foyer LIFTING INJECTION  06/13/2019   Procedure: SUBMUCOSAL LIFTING INJECTION;  Surgeon: Irving Copas., MD;   Location: [place];  Service: Gastroenterology;;   Current Facility-Administered Medications  Medication Dose Route Frequency Provider Last Rate Last Admin  . 0.9 %  sodium chloride infusion   Intravenous Continuous Mansouraty, Telford Nab., MD      . lactated ringers infusion   Intravenous Continuous Mansouraty, Telford Nab., MD 10 mL/hr at 02/11/20 1031 New Bag at 02/11/20 1031   No Known Allergies Family History  Problem Relation Age of Onset  . Heart failure Mother   . Stroke Father   . Colon cancer Neg Hx   . Esophageal cancer Neg Hx   . Stomach cancer Neg Hx   . Rectal cancer Neg Hx   . Inflammatory bowel disease Neg Hx   . Liver disease Neg Hx   . Pancreatic cancer Neg Hx    Social History   Socioeconomic History  . Marital status: Single    Spouse name: Not on file  . Number of children: 0  . Years of education: Not on file  . Highest education level: Not on file  Occupational History  . Occupation: retired  Tobacco Use  . Smoking status: Current Every Day Smoker    Packs/day: 1.00    Years: 44.00    Pack years: 44.00    Types: Cigarettes    Start date: 08/01/2016  . Smokeless tobacco: Never Used  . Tobacco comment: tobacco info given  Vaping Use  . Vaping Use: Never used  Substance and Sexual Activity  . Alcohol use: Yes    Alcohol/week: 0.0 standard drinks    Comment: social  . Drug  use: No  . Sexual activity: Yes    Comment: given condoms  Other Topics Concern  . Not on file  Social History Narrative  . Not on file   Social Determinants of Health   Financial Resource Strain: Not on file  Food Insecurity: Not on file  Transportation Needs: Not on file  Physical Activity: Not on file  Stress: Not on file  Social Connections: Not on file  Intimate Partner Violence: Not on file    Physical Exam: Vital signs in last 24 hours: Temp:  [98.3 F (36.8 C)] 98.3 F (36.8 C) (01/10 1017) Resp:  [20] 20 (01/10 1017) BP: (146)/(74) 146/74 (01/10  1017) SpO2:  [96 %] 96 % (01/10 1017) Weight:  [79.4 kg] 79.4 kg (01/10 1017)   GEN: NAD EYE: Sclerae anicteric ENT: MMM CV: Non-tachycardic GI: Soft, NT/ND NEURO:  Alert & Oriented x 3  Lab Results: No results for input(s): WBC, HGB, HCT, PLT in the last 72 hours. BMET No results for input(s): NA, K, CL, CO2, GLUCOSE, BUN, CREATININE, CALCIUM in the last 72 hours. LFT No results for input(s): PROT, ALBUMIN, AST, ALT, ALKPHOS, BILITOT, BILIDIR, IBILI in the last 72 hours. PT/INR No results for input(s): LABPROT, INR in the last 72 hours.   Impression / Plan: This is a 70 y.o.male who presents for Colonoscopy for follow up of Colon Cancer resection s/p EMR.  Patient overdue for surveillance and has rising CEA.  CT scans pending completion next week for first real follow up.  The risks and benefits of endoscopic evaluation were discussed with the patient; these include but are not limited to the risk of perforation, infection, bleeding, missed lesions, lack of diagnosis, severe illness requiring hospitalization, as well as anesthesia and sedation related illnesses.  The patient is agreeable to proceed.    Justice Britain, MD [facility] Advanced [facility]oscopy Office # [phone]

## 2020-02-11 NOTE — Anesthesia Procedure Notes (Signed)
Procedure Name: MAC Date/Time: 02/11/2020 11:10 AM Performed by: Rande Brunt, CRNA Pre-anesthesia Checklist: Patient identified, Emergency Drugs available, Suction available, Patient being monitored and Timeout performed Patient Re-evaluated:Patient Re-evaluated prior to induction Oxygen Delivery Method: Simple face mask Preoxygenation: Pre-oxygenation with 100% oxygen Induction Type: IV induction Placement Confirmation: CO2 detector and breath sounds checked- equal and bilateral Dental Injury: Teeth and Oropharynx as per pre-operative assessment

## 2020-02-11 NOTE — Op Note (Signed)
[facility] Patient Name: [name] Procedure Date : 02/11/2020 MRN: [id number] Attending MD: Justice Britain , MD Date of Birth: [birth year] CSN: [id number] Age: 70 Admit Type: Inpatient Procedure:                Colonoscopy Indications:              High risk colon cancer surveillance: Personal                            history of colon cancer (Cecal Polyp with Invasive                            Adenocarcinoma into Submucosa with 3 mm margin) -                            patient deferred Surgery evaluation but now has                            rising CEA Providers:                Justice Britain, MD, Elmer Ramp. Tilden Dome, RN, Ladona Ridgel, Technician Referring MD:             Carlota Raspberry. Havery Moros, MD, Ranell Patrick. Carlota Raspberry, MD,                            Truitt Merle, Wilhemina Cash. Burton Medicines:                Monitored Anesthesia Care Complications:            No immediate complications. Estimated Blood Loss:     Estimated blood loss was minimal. Procedure:                Pre-Anesthesia Assessment:                           - Prior to the procedure, a History and Physical                            was performed, and patient medications and                            allergies were reviewed. The patient's tolerance of                            previous anesthesia was also reviewed. The risks                            and benefits of the procedure and the sedation                            options and risks were discussed with the patient.  All questions were answered, and informed consent                            was obtained. Prior Anticoagulants: The patient has                            taken no previous anticoagulant or antiplatelet                            agents. ASA Grade Assessment: II - A patient with                            mild systemic disease. After reviewing the risks                             and benefits, the patient was deemed in                            satisfactory condition to undergo the procedure.                           After obtaining informed consent, the colonoscope                            was passed under direct vision. Throughout the                            procedure, the patient's blood pressure, pulse, and                            oxygen saturations were monitored continuously. The                            CF-HQ190L ([id number] Olympus colonoscope was                            introduced through the anus and advanced to the the                            cecum, identified by palpation. The colonoscopy was                            performed without difficulty. The patient tolerated                            the procedure. The quality of the bowel preparation                            was adequate. The terminal ileum, ileocecal valve,                            appendiceal orifice, and rectum were photographed. Scope In: 11:21:17 AM Scope Out: 11:55:39 AM Scope Withdrawal Time: 0 hours 31 minutes 33 seconds  Total Procedure Duration: 0 hours 34 minutes 22 seconds  Findings:      The digital rectal exam findings include hemorrhoids. Pertinent       negatives include no palpable rectal lesions.      The terminal ileum and ileocecal valve appeared normal.      A medium post mucosectomy scar was found in the cecum. There was areas       of polypoid tissue surrounding the cecal base which have more of a       typical appearance of clip artifact rather than recurrent adenoma. The       region of the cecal based was resected/removed with a cold snare.       Resection and retrieval were complete.      Multiple small-mouthed diverticula were found in the recto-sigmoid colon       and sigmoid colon.      Normal mucosa was found in the entire colon otherwise.      Non-bleeding non-thrombosed external and internal hemorrhoids were found       during  retroflexion, during perianal exam and during digital exam. The       hemorrhoids were Grade II (internal hemorrhoids that prolapse but reduce       spontaneously). Impression:               - Hemorrhoids found on digital rectal exam.                           - The examined portion of the ileum was normal.                           - Post mucosectomy scar in the cecum with areas of                            likely clip artifact noted in the cecal base though                            potential for residual adenoma is present. The                            cecal base was resected with a cold snare.                           - Diverticulosis in the recto-sigmoid colon and in                            the sigmoid colon.                           - Normal mucosa in the entire examined colon                            otherwise.                           - Non-bleeding non-thrombosed external and internal  hemorrhoids. Recommendation:           - The patient will be observed post-procedure,                            until all discharge criteria are met.                           - Discharge patient to home.                           - Patient has a contact number available for                            emergencies. The signs and symptoms of potential                            delayed complications were discussed with the                            patient. Return to normal activities tomorrow.                            Written discharge instructions were provided to the                            patient.                           - High fiber diet.                           - Use FiberCon 1-2 tablets PO daily.                           - Continue present medications.                           - Await pathology results.                           - Repeat colonoscopy for surveillance based on                            pathology results, but likely from a  colon cancer                            screening perspective would be a 1-year follow up,                            can consider 6-9 months if any evidence of                            adenomatous tissue is present on pathology.                           -  The findings and recommendations were discussed                            with the patient. Procedure Code(s):        --- Professional ---                           [id number], Colonoscopy, flexible; with removal of                            tumor(s), polyp(s), or other lesion(s) by snare                            technique Diagnosis Code(s):        --- Professional ---                           J09.326, Personal history of other malignant                            neoplasm of large intestine                           K64.1, Second degree hemorrhoids                           Z98.890, Other specified postprocedural states                           K57.30, Diverticulosis of large intestine without                            perforation or abscess without bleeding CPT copyright 2019 American Medical Association. All rights reserved. The codes documented in this report are preliminary and upon coder review may  be revised to meet current compliance requirements. Justice Britain, MD 02/11/2020 12:13:17 PM Number of Addenda: 0

## 2020-02-11 NOTE — Transfer of Care (Signed)
Immediate Anesthesia Transfer of Care Note  Patient: [name]  Procedure(s) Performed: COLONOSCOPY WITH PROPOFOL (N/A ) BIOPSY  Patient Location: Endoscopy Unit  Anesthesia Type:MAC  Level of Consciousness: awake, drowsy and patient cooperative  Airway & Oxygen Therapy: Patient Spontanous Breathing  Post-op Assessment: Report given to RN, Post -op Vital signs reviewed and stable and Patient moving all extremities  Post vital signs: Reviewed and stable  Last Vitals:  Vitals Value Taken Time  BP 145/85   Temp    Pulse 75   Resp 12   SpO2 98     Last Pain:  Vitals:   02/11/20 1017  TempSrc: Oral  PainSc: 0-No pain         Complications: No complications documented.

## 2020-02-12 ENCOUNTER — Encounter (HOSPITAL_COMMUNITY): Payer: Self-pay | Admitting: Gastroenterology

## 2020-02-12 LAB — SURGICAL PATHOLOGY

## 2020-02-12 NOTE — Anesthesia Postprocedure Evaluation (Signed)
Anesthesia Post Note  Patient: [name]  Procedure(s) Performed: COLONOSCOPY WITH PROPOFOL (N/A ) BIOPSY     Patient location during evaluation: Endoscopy Anesthesia Type: MAC Level of consciousness: awake and alert Pain management: pain level controlled Vital Signs Assessment: post-procedure vital signs reviewed and stable Respiratory status: spontaneous breathing, nonlabored ventilation, respiratory function stable and patient connected to nasal cannula oxygen Cardiovascular status: stable and blood pressure returned to baseline Postop Assessment: no apparent nausea or vomiting Anesthetic complications: no   No complications documented.  Last Vitals:  Vitals:   02/11/20 1210 02/11/20 1220  BP: (!) 143/80 (!) 155/82  Pulse: 71 66  Resp: 20 19  Temp:    SpO2: 100% 99%    Last Pain:  Vitals:   02/11/20 1220  TempSrc:   PainSc: 0-No pain                 Tacey Dimaggio S

## 2020-02-13 ENCOUNTER — Encounter: Payer: Self-pay | Admitting: Gastroenterology

## 2020-02-21 ENCOUNTER — Other Ambulatory Visit: Payer: Self-pay

## 2020-02-21 ENCOUNTER — Encounter (HOSPITAL_COMMUNITY): Payer: Self-pay

## 2020-02-21 ENCOUNTER — Ambulatory Visit (HOSPITAL_COMMUNITY)
Admission: RE | Admit: 2020-02-21 | Discharge: 2020-02-21 | Disposition: A | Discharge status: Home / Self Care | Payer: Medicare Other | Source: Ambulatory Visit | Attending: Nurse Practitioner | Admitting: Nurse Practitioner

## 2020-02-21 DIAGNOSIS — C182 Malignant neoplasm of ascending colon: Secondary | ICD-10-CM | POA: Insufficient documentation

## 2020-02-21 LAB — POCT I-STAT CREATININE: Creatinine, Ser: 1 mg/dL (ref 0.61–1.24)

## 2020-02-21 MED ORDER — IOHEXOL 300 MG/ML  SOLN
100.0000 mL | Freq: Once | INTRAMUSCULAR | Status: AC | PRN
  Administered 2020-02-21: 100 mL via INTRAVENOUS

## 2020-02-24 NOTE — Progress Notes (Addendum)
[name]   [phone] [phone]   Clinic Follow up Note   Patient Care Team: [name], [name] as PCP - General (Family Medicine) [name], [name], [name] as PCP - Infectious Diseases (Infectious Diseases) Aaron Ricker, RN as Oncology Nurse Navigator [name], [name] as Consulting Physician (Hematology) Aaron Samples, NP as Nurse Practitioner (Nurse Practitioner) [name], Aaron Starring., [name] as Consulting Physician (Gastroenterology) 02/25/2020  CHIEF COMPLAINT: Follow up h/o colon cancer   SUMMARY OF ONCOLOGIC HISTORY: Oncology History Overview Note  Cancer Staging Cancer of right colon [facility]) Staging form: Colon and Rectum, AJCC 8th Edition - Clinical stage from 06/13/2019: Stage I (cT1, cN0, cM0) - Signed by [name], [name] on 12/23/2019    Cancer of right colon (HCC)  03/13/2019 Procedure   Colonoscopy by Dr Adela Lank IMPRESSION - The examined portion of the ileum was normal. - Polypoid lesion in the cecum overlying the appendiceal orifice as outlined. Biopsied. - Diverticulosis in the sigmoid colon and in the cecum. - Internal hemorrhoids. - The examination was otherwise normal.     Diagnosis Surgical [P], colon, cecum polyp/polypoid lesion - SUPERFICIAL FRAGMENTS OF TUBULOVILLOUS ADENOMA WITH FOCAL HIGH-GRADE DYSPLASIA - NEGATIVE FOR CARCINOMA   03/28/2019 Imaging   CT AP W contrast  IMPRESSION: 1. 3.5 cm cecal mass, corresponding to the colonoscopy abnormality. No pericolonic extension or adjacent adenopathy. 2. Hepatic morphology which is suspicious for mild cirrhosis. Recannulized paraumbilical vein, suggesting portal venous hypertension. 3.  Aortic Atherosclerosis (ICD10-I70.0). 4. Mild prostatomegaly.   06/13/2019 Cancer Staging   Staging form: Colon and Rectum, AJCC 8th Edition - Clinical stage from 06/13/2019: Stage I (cT1, cN0, cM0) - Signed by [name], [name] on 12/23/2019   06/13/2019 Procedure    Colonoscopy for polypectomy by Dr Meridee Score  IMPRESSION - Hemorrhoids found on digital rectal exam. - The examined portion of the ileum was normal. - One 45-50 mm polyp in the cecum, removed with piecemeal mucosal resection. Resected and retrieved. Bleeding was noted during the procedure. Injected with Epinephrine as above. Clips (MR conditional) were placed. Persistent oozing noted. Hemostatic spray applied. - Diverticulosis in the recto-sigmoid colon, in the sigmoid colon, in the descending colon and in the ascending colon. - Non-bleeding non-thrombosed external and internal hemorrhoids   06/13/2019 Initial Biopsy   FINAL MICROSCOPIC DIAGNOSIS:   A. COLON, CECUM, POLYPECTOMY:  - Invasive adenocarcinoma, poorly differentiated, scattered small foci.  - Adenocarcinoma extends into superficial submucosa (depth of 0.3 cm).  - The surgical resection margins are negative for carcinoma.  - See oncology table below.    Mismatch Repair Protein (IHC)   SUMMARY INTERPRETATION: NORMAL    IHC EXPRESSION RESULTS   TEST           RESULT  MLH1:          Preserved nuclear expression  MSH2:          Preserved nuclear expression  MSH6:          Preserved nuclear expression  PMS2:          Preserved nuclear expression    06/19/2019 Imaging   CT Chest  IMPRESSION: 1. No evidence of metastatic disease in the chest. 2. Finely irregular liver surface suggests cirrhosis. 3. Aortic Atherosclerosis (ICD10-I70.0) and Emphysema (ICD10-J43.9).       12/23/2019 Initial Diagnosis   Cancer of right colon (HCC)   02/11/2020 Procedure   Colonoscopy by Dr. Meridee Score  -Hemorrhoids found on digital rectal exam. -  The examined portion of the ileum was normal. - Post mucosectomy scar in the cecum with areas of likely clip artifact noted in the cecal base though potential for residual adenoma is present. The cecal base was resected with a cold snare. - Diverticulosis in the recto-sigmoid colon and in the  sigmoid colon. - Normal mucosa in the entire examined colon otherwise. - Non-bleeding non-thrombosed external and internal hemorrhoids   02/11/2020 Pathology Results   FINAL MICROSCOPIC DIAGNOSIS:  A. COLON, CECUM BASE, BIOPSY:  - Colonic mucosa with benign lymphoid aggregates.  - No dysplasia, adenomatous change or carcinoma.      CURRENT THERAPY: Observation/close surveillance   INTERVAL HISTORY: [name] returns for follow up as scheduled. He was last seen by Dr. Burr Medico 12/24/19. He underwent colonoscopy by Dr. Rush Landmark in 02/2020 and repeat CT due to rising CEA (5-6). He is a current daily smoker. He feels well. Denies pain, change in bowel habits, bleeding, n/v, unintentional weight loss, fatigue, fever, chills, cough, chest pain, dyspnea, or other changes. He is compliant with meds. Has been vaccinated and boosted. Denies heavy alcohol use.    MEDICAL HISTORY:  Past Medical History:  Diagnosis Date  . BPH (benign prostatic hyperplasia)   . Erectile dysfunction   . History of kidney stones    passed stones  . HIV infection ([place])   . Hypertension   . Pruritic rash 04/21/2015  . Secondary polycythemia 05/26/2016  . Syphilis 04/19/2018    SURGICAL HISTORY: Past Surgical History:  Procedure Laterality Date  . BIOPSY  02/11/2020   Procedure: BIOPSY;  Surgeon: Aaron Landmark Aaron Nab., [name];  Location: [place];  Service: Gastroenterology;;  . COLONOSCOPY    . COLONOSCOPY WITH PROPOFOL N/A 06/13/2019   Procedure: COLONOSCOPY WITH PROPOFOL;  Surgeon: Aaron Landmark Aaron Nab., [name];  Location: [place];  Service: Gastroenterology;  Laterality: N/A;  . COLONOSCOPY WITH PROPOFOL N/A 02/11/2020   Procedure: COLONOSCOPY WITH PROPOFOL;  Surgeon: Aaron Landmark Aaron Nab., [name];  Location: [place];  Service: Gastroenterology;  Laterality: N/A;  . ENDOSCOPIC MUCOSAL RESECTION N/A 06/13/2019   Procedure: ENDOSCOPIC MUCOSAL RESECTION;  Surgeon: Aaron Landmark Aaron Nab., [name];  Location: [place];  Service: Gastroenterology;  Laterality: N/A;  . HEMOSTASIS CLIP PLACEMENT  06/13/2019   Procedure: HEMOSTASIS CLIP PLACEMENT;  Surgeon: Aaron Copas., [name];  Location: [place];  Service: Gastroenterology;;  . HEMOSTASIS CONTROL  06/13/2019   Procedure: HEMOSTASIS CONTROL;  Surgeon: Aaron Copas., [name];  Location: [place];  Service: Gastroenterology;;  . Chrystine Oiler SELECTIVE EACH ADDITIONAL VESSEL  06/13/2019  . IR ANGIOGRAM VISCERAL SELECTIVE  06/13/2019  . IR EMBO ART  VEN HEMORR LYMPH EXTRAV  INC GUIDE ROADMAPPING  06/13/2019  . IR US GUIDE VASC ACCESS RIGHT  06/13/2019  . PARTIAL COLECTOMY    . SCLEROTHERAPY  06/13/2019   Procedure: [name];  Surgeon: [name], Aaron Nab., [name];  Location: [place];  Service: Gastroenterology;;  . Lia Foyer LIFTING INJECTION  06/13/2019   Procedure: SUBMUCOSAL LIFTING INJECTION;  Surgeon: Aaron Copas., [name];  Location: [place];  Service: Gastroenterology;;    I have reviewed the social history and family history with the patient and they are unchanged from previous note.  ALLERGIES:  has No Known Allergies.  MEDICATIONS:  Current Outpatient Medications  Medication Sig Dispense Refill  . amLODipine (NORVASC) 10 MG tablet Take 1 tablet (10 mg total) by mouth daily. 90 tablet 2  . elvitegravir-cobicistat-emtricitabine-tenofovir (GENVOYA) 150-150-200-10 MG TABS tablet TAKE 1 TABLET BY MOUTH EVERY DAY WITH  BREAKFAST (Patient taking differently: Take 1 tablet by mouth daily with breakfast. TAKE 1 TABLET BY MOUTH EVERY DAY WITH BREAKFAST) 30 tablet 11   No current facility-administered medications for this visit.    PHYSICAL EXAMINATION: ECOG PERFORMANCE STATUS: 0 - Asymptomatic  Vitals:   02/25/20 1505  BP: 130/72  Pulse: 88  Resp: 18  Temp: (!) 97.3 F (36.3 C)  SpO2: 100%   Filed Weights   02/25/20 1505  Weight: 172 lb 4.8 oz (78.2 kg)    GENERAL:alert, no distress and  comfortable SKIN: no rash  EYES:  sclera clear NECK: without mass  LYMPH:  no palpable cervical or supraclavicular lymphadenopathy  LUNGS: clear with normal breathing effort HEART: regular rate & rhythm, no lower extremity edema ABDOMEN:abdomen soft, non-tender and normal bowel sounds. No hepatomegaly. No ascites  NEURO: alert & oriented x 3 with fluent speech, no focal motor/sensory deficits   LABORATORY DATA:  I have reviewed the data as listed CBC Latest Ref Rng & Units 02/25/2020 12/24/2019 09/17/2019  WBC 4.0 - 10.5 K/uL 8.5 7.1 6.0  Hemoglobin 13.0 - 17.0 g/dL 17.6(H) 16.9 17.8(H)  Hematocrit 39.0 - 52.0 % 50.7 49.8 52.8(H)  Platelets 150 - 400 K/uL 263 296 225     CMP Latest Ref Rng & Units 02/21/2020 12/24/2019 09/17/2019  Glucose 70 - 99 mg/dL - 104(H) 94  BUN 8 - 23 mg/dL - 11 11  Creatinine 0.61 - 1.24 mg/dL 1.00 1.18 1.18  Sodium 135 - 145 mmol/L - 136 137  Potassium 3.5 - 5.1 mmol/L - 4.2 4.7  Chloride 98 - 111 mmol/L - 104 103  CO2 22 - 32 mmol/L - 24 28  Calcium 8.9 - 10.3 mg/dL - 9.5 9.4  Total Protein 6.5 - 8.1 g/dL - 7.7 7.1  Total Bilirubin 0.3 - 1.2 mg/dL - 0.7 0.5  Alkaline Phos 38 - 126 U/L - 93 -  AST 15 - 41 U/L - 21 16  ALT 0 - 44 U/L - 27 17      RADIOGRAPHIC STUDIES: I have personally reviewed the radiological images as listed and agreed with the findings in the report. No results found.   ASSESSMENT & PLAN: 70 yo male with   1. Adenocarcinoma of the cecum, poorly differentiated, pT1NXM0, stage I, MMR normal  -He had a large cecal polyp on first screening colonoscopy 03/2019 which was negative for malignancy, repeat colonoscopy 3 months later in 06/2019 showed multiple foci of poorly differentiated adenocarcinoma extending into superficial submucosa to depth of 0.3. This was resected by polypectomy.  -CEA 4 after removal. He is a smoker. CT is negative for metastatic disease.  -Pt declined hemicolectomy due to lack of social support  -he agrees  with close surveillance.  -colonoscopy 02/2020 negative for residual disease. Repeat 02/2021 ([name]) -due to rising CEA (5-6, still current smoker), CT CAP obtained 02/21/20 showed soft tissue at cecal base, c/w scar tissue seen on colonoscopy. Negative for recurrent/metastatic disease -continue surveillance.  -next scan 6-9 months   2. HIV -diagnosed 1984, controlled per patient  -on Genvoya per Dr. Tommy Medal  Disposition:  Mr. Pickney is clinically doing well. Exam is benign, labs are pending. We reviewed recent colonoscopy, path, and imaging which shows scar tissue at the cecal base site of polypectomy, path negative for carcinoma. No evidence of cancer recurrence/metastasis on CT. We discussed role of guardant reveal ctDNA testing for assurance, he agrees. If positive, this predicts a very high risk of recurrence.  His elevated CEA is likely related to current smoking status. Will f/up on outstanding labs from today and ctDNA.   We reviewed the surveillance plan. We will see him back in 4 months, next CT in 6-9 months. Colonoscopy due next year 02/2021. We reviewed s/sx of recurrence and he will call if he experiences new fatigue, unintentional weight loss, abdominal pain, bleeding, change in bowel habits.   Patient seen with Dr. Burr Medico.   All questions were answered. The patient knows to call the clinic with any problems, questions or concerns. No barriers to learning were detected.     Alla Feeling, NP 02/25/20   Addendum  I have seen the patient, examined him. I agree with the assessment and and plan and have edited the notes.   Pt is clinically doing well, lab and scan reviewed in person. The soft tissue in cecum on CT scan is inside cecum, consistent with mucosa change on recent colonoscopy, which showed no evidence of cancer on biopsy. This is concordant. I do not have concern or suspicion for local recurrence. I recommend close monitoring, pt agrees with GuardantReveal today.  Will see pt back in 4 months. Case reviewed with Dr. Rush Landmark also.   Truitt Merle  02/25/2020

## 2020-02-25 ENCOUNTER — Encounter: Payer: Self-pay | Admitting: Nurse Practitioner

## 2020-02-25 ENCOUNTER — Inpatient Hospital Stay: Payer: Medicare Other | Attending: Hematology | Admitting: Nurse Practitioner

## 2020-02-25 ENCOUNTER — Inpatient Hospital Stay: Payer: Medicare Other

## 2020-02-25 ENCOUNTER — Other Ambulatory Visit: Payer: Self-pay

## 2020-02-25 VITALS — BP 130/72 | HR 88 | Temp 97.3°F | Resp 18 | Wt 172.3 lb

## 2020-02-25 DIAGNOSIS — F17210 Nicotine dependence, cigarettes, uncomplicated: Secondary | ICD-10-CM | POA: Insufficient documentation

## 2020-02-25 DIAGNOSIS — Z85038 Personal history of other malignant neoplasm of large intestine: Secondary | ICD-10-CM | POA: Diagnosis not present

## 2020-02-25 DIAGNOSIS — Z21 Asymptomatic human immunodeficiency virus [HIV] infection status: Secondary | ICD-10-CM | POA: Insufficient documentation

## 2020-02-25 DIAGNOSIS — C180 Malignant neoplasm of cecum: Secondary | ICD-10-CM

## 2020-02-25 DIAGNOSIS — C182 Malignant neoplasm of ascending colon: Secondary | ICD-10-CM

## 2020-02-25 LAB — CBC WITH DIFFERENTIAL (CANCER CENTER ONLY)
Abs Immature Granulocytes: 0.04 10*3/uL (ref 0.00–0.07)
Basophils Absolute: 0 10*3/uL (ref 0.0–0.1)
Basophils Relative: 0 %
Eosinophils Absolute: 0.1 10*3/uL (ref 0.0–0.5)
Eosinophils Relative: 2 %
HCT: 50.7 % (ref 39.0–52.0)
Hemoglobin: 17.6 g/dL — ABNORMAL HIGH (ref 13.0–17.0)
Immature Granulocytes: 1 %
Lymphocytes Relative: 22 %
Lymphs Abs: 1.9 10*3/uL (ref 0.7–4.0)
MCH: 32.8 pg (ref 26.0–34.0)
MCHC: 34.7 g/dL (ref 30.0–36.0)
MCV: 94.6 fL (ref 80.0–100.0)
Monocytes Absolute: 0.6 10*3/uL (ref 0.1–1.0)
Monocytes Relative: 7 %
Neutro Abs: 5.9 10*3/uL (ref 1.7–7.7)
Neutrophils Relative %: 68 %
Platelet Count: 263 10*3/uL (ref 150–400)
RBC: 5.36 MIL/uL (ref 4.22–5.81)
RDW: 13.2 % (ref 11.5–15.5)
WBC Count: 8.5 10*3/uL (ref 4.0–10.5)
nRBC: 0 % (ref 0.0–0.2)

## 2020-02-25 LAB — CMP (CANCER CENTER ONLY)
ALT: 26 U/L (ref 0–44)
AST: 23 U/L (ref 15–41)
Albumin: 4.7 g/dL (ref 3.5–5.0)
Alkaline Phosphatase: 91 U/L (ref 38–126)
Anion gap: 10 (ref 5–15)
BUN: 16 mg/dL (ref 8–23)
CO2: 24 mmol/L (ref 22–32)
Calcium: 9.7 mg/dL (ref 8.9–10.3)
Chloride: 104 mmol/L (ref 98–111)
Creatinine: 1.23 mg/dL (ref 0.61–1.24)
GFR, Estimated: >60 mL/min (ref >60)
Glucose, Bld: 100 mg/dL — ABNORMAL HIGH (ref 70–99)
Potassium: 4 mmol/L (ref 3.5–5.1)
Sodium: 138 mmol/L (ref 135–145)
Total Bilirubin: 0.9 mg/dL (ref 0.3–1.2)
Total Protein: 8.5 g/dL — ABNORMAL HIGH (ref 6.5–8.1)

## 2020-02-25 NOTE — Progress Notes (Signed)
[place] lab entered

## 2020-02-26 ENCOUNTER — Encounter: Payer: Self-pay | Admitting: Nurse Practitioner

## 2020-02-26 ENCOUNTER — Telehealth: Payer: Self-pay | Admitting: Nurse Practitioner

## 2020-02-26 LAB — CEA (IN HOUSE-CHCC): CEA (CHCC-In House): 7.63 ng/mL — ABNORMAL HIGH (ref 0.00–5.00)

## 2020-02-26 NOTE — Telephone Encounter (Signed)
Called pt to schedule appts per 1/24 LOS. Pt stated he did not feel comfortable coming in to the hospital in 4 month. That he would do every 6 months. He also declined scheduling an appt at this time and stated that he would call the office to schedule something when he is ready.

## 2020-03-01 ENCOUNTER — Encounter: Payer: Self-pay | Admitting: Family Medicine

## 2020-03-06 ENCOUNTER — Other Ambulatory Visit: Payer: Medicare Other

## 2020-03-06 ENCOUNTER — Other Ambulatory Visit: Payer: Self-pay

## 2020-03-06 ENCOUNTER — Other Ambulatory Visit (HOSPITAL_COMMUNITY)
Admission: RE | Admit: 2020-03-06 | Discharge: 2020-03-06 | Disposition: A | Discharge status: Home / Self Care | Payer: Medicare Other | Source: Ambulatory Visit | Attending: Infectious Disease | Admitting: Infectious Disease

## 2020-03-06 ENCOUNTER — Ambulatory Visit: Payer: Medicare Other

## 2020-03-06 DIAGNOSIS — Z113 Encounter for screening for infections with a predominantly sexual mode of transmission: Secondary | ICD-10-CM

## 2020-03-06 DIAGNOSIS — Z79899 Other long term (current) drug therapy: Secondary | ICD-10-CM

## 2020-03-06 DIAGNOSIS — B20 Human immunodeficiency virus [HIV] disease: Secondary | ICD-10-CM

## 2020-03-07 LAB — URINE CYTOLOGY ANCILLARY ONLY
Chlamydia: NEGATIVE
Comment: NEGATIVE
Comment: NORMAL
Neisseria Gonorrhea: NEGATIVE

## 2020-03-07 LAB — T-HELPER CELL (CD4) - (RCID CLINIC ONLY)
CD4 % Helper T Cell: 25 % — ABNORMAL LOW (ref 33–65)
CD4 T Cell Abs: 362 /uL — ABNORMAL LOW (ref 400–1790)

## 2020-03-09 ENCOUNTER — Other Ambulatory Visit: Payer: Self-pay | Admitting: Infectious Disease

## 2020-03-09 DIAGNOSIS — B20 Human immunodeficiency virus [HIV] disease: Secondary | ICD-10-CM

## 2020-03-10 LAB — CBC WITH DIFFERENTIAL/PLATELET
Absolute Monocytes: 580 cells/uL (ref 200–950)
Basophils Absolute: 12 cells/uL (ref 0–200)
Basophils Relative: 0.2 %
Eosinophils Absolute: 92 cells/uL (ref 15–500)
Eosinophils Relative: 1.5 %
HCT: 49.9 % (ref 38.5–50.0)
Hemoglobin: 17.7 g/dL — ABNORMAL HIGH (ref 13.2–17.1)
Lymphs Abs: 1568 cells/uL (ref 850–3900)
MCH: 33.4 pg — ABNORMAL HIGH (ref 27.0–33.0)
MCHC: 35.5 g/dL (ref 32.0–36.0)
MCV: 94.2 fL (ref 80.0–100.0)
MPV: 10.1 fL (ref 7.5–12.5)
Monocytes Relative: 9.5 %
Neutro Abs: 3849 cells/uL (ref 1500–7800)
Neutrophils Relative %: 63.1 %
Platelets: 220 10*3/uL (ref 140–400)
RBC: 5.3 10*6/uL (ref 4.20–5.80)
RDW: 13.5 % (ref 11.0–15.0)
Total Lymphocyte: 25.7 %
WBC: 6.1 10*3/uL (ref 3.8–10.8)

## 2020-03-10 LAB — COMPREHENSIVE METABOLIC PANEL
AG Ratio: 1.7 (calc) (ref 1.0–2.5)
ALT: 17 U/L (ref 9–46)
AST: 16 U/L (ref 10–35)
Albumin: 4.5 g/dL (ref 3.6–5.1)
Alkaline phosphatase (APISO): 81 U/L (ref 35–144)
BUN: 11 mg/dL (ref 7–25)
CO2: 24 mmol/L (ref 20–32)
Calcium: 9.8 mg/dL (ref 8.6–10.3)
Chloride: 103 mmol/L (ref 98–110)
Creat: 1.15 mg/dL (ref 0.70–1.25)
Globulin: 2.7 g/dL (calc) (ref 1.9–3.7)
Glucose, Bld: 78 mg/dL (ref 65–99)
Potassium: 4.6 mmol/L (ref 3.5–5.3)
Sodium: 139 mmol/L (ref 135–146)
Total Bilirubin: 0.5 mg/dL (ref 0.2–1.2)
Total Protein: 7.2 g/dL (ref 6.1–8.1)

## 2020-03-10 LAB — LIPID PANEL
Cholesterol: 161 mg/dL (ref <200)
HDL: 49 mg/dL (ref >=40)
LDL Cholesterol (Calc): 90 mg/dL (calc)
Non-HDL Cholesterol (Calc): 112 mg/dL (calc) (ref <130)
Total CHOL/HDL Ratio: 3.3 (calc) (ref <5.0)
Triglycerides: 121 mg/dL (ref <150)

## 2020-03-10 LAB — RPR: RPR Ser Ql: REACTIVE — AB

## 2020-03-10 LAB — HIV-1 RNA QUANT-NO REFLEX-BLD
HIV 1 RNA Quant: 55 Copies/mL — ABNORMAL HIGH
HIV-1 RNA Quant, Log: 1.74 Log cps/mL — ABNORMAL HIGH

## 2020-03-10 LAB — RPR TITER: RPR Titer: 1:8 {titer} — ABNORMAL HIGH

## 2020-03-10 LAB — FLUORESCENT TREPONEMAL AB(FTA)-IGG-BLD: Fluorescent Treponemal ABS: REACTIVE — AB

## 2020-03-27 ENCOUNTER — Encounter: Payer: Self-pay | Admitting: Infectious Disease

## 2020-03-27 ENCOUNTER — Ambulatory Visit (INDEPENDENT_AMBULATORY_CARE_PROVIDER_SITE_OTHER): Payer: Medicare Other | Admitting: Infectious Disease

## 2020-03-27 ENCOUNTER — Other Ambulatory Visit: Payer: Self-pay

## 2020-03-27 VITALS — BP 135/84 | HR 80 | Temp 98.4°F | Ht 70.0 in | Wt 173.0 lb

## 2020-03-27 DIAGNOSIS — C182 Malignant neoplasm of ascending colon: Secondary | ICD-10-CM

## 2020-03-27 DIAGNOSIS — A539 Syphilis, unspecified: Secondary | ICD-10-CM | POA: Diagnosis not present

## 2020-03-27 DIAGNOSIS — B20 Human immunodeficiency virus [HIV] disease: Secondary | ICD-10-CM | POA: Diagnosis present

## 2020-03-27 DIAGNOSIS — F17200 Nicotine dependence, unspecified, uncomplicated: Secondary | ICD-10-CM

## 2020-03-27 MED ORDER — DOVATO 50-300 MG PO TABS: 1.0000 | ORAL_TABLET | Freq: Every day | ORAL | 11 refills | Status: DC

## 2020-03-27 NOTE — Progress Notes (Signed)
Subjective:   Chief complaint: followup for his HIV on medications., Patient ID: [name], male    DOB: [birth year], 70 y.o.   MRN: [id number]  HPI  70 year old Caucasian male with HIV that has been superbly well controlled on Atripla --> STRIBILD-->  GENVOYA returns for routine followup.  Once I last saw [name] he was diagnosed with stage I colon cancer and status post resection of cancerous lesions from the colon.  Apparently he was offered a hemicolectomy but decided against this due to lack of sufficient social support.  Of note he says that he has previously had a partial colectomy to treat diverticulitis and had a prolonged stay in the hospital at the time.  He does have a rising CEA antigen CT scan done in January shows some abnormalities in the colon that have been thought to be due to scarring that was seen on endoscopy.  He does continue to smoke.  I have encouraged him from my own personal experience with losing someone to colon cancer to strongly consider a hemicolectomy to remove the areas where the cancer was found and reduce chances of him developing metastatic disease.  Would like to simplify his antiretroviral regimen and get him off the booster that is in [place] and we decided to switch him to [place]       Past Medical History:  Diagnosis Date  . BPH (benign prostatic hyperplasia)   . Erectile dysfunction   . History of kidney stones    passed stones  . HIV infection ([place])   . Hypertension   . Pruritic rash 04/21/2015  . Secondary polycythemia 05/26/2016  . Syphilis 04/19/2018    Past Surgical History:  Procedure Laterality Date  . BIOPSY  02/11/2020   Procedure: BIOPSY;  Surgeon: Rush Landmark Telford Nab., MD;  Location: [place];  Service: Gastroenterology;;  . COLONOSCOPY    . COLONOSCOPY WITH PROPOFOL N/A 06/13/2019   Procedure: COLONOSCOPY WITH PROPOFOL;  Surgeon: Rush Landmark Telford Nab., MD;  Location: [place];  Service: Gastroenterology;   Laterality: N/A;  . COLONOSCOPY WITH PROPOFOL N/A 02/11/2020   Procedure: COLONOSCOPY WITH PROPOFOL;  Surgeon: Rush Landmark Telford Nab., MD;  Location: [place];  Service: Gastroenterology;  Laterality: N/A;  . ENDOSCOPIC MUCOSAL RESECTION N/A 06/13/2019   Procedure: ENDOSCOPIC MUCOSAL RESECTION;  Surgeon: Rush Landmark Telford Nab., MD;  Location: [place];  Service: Gastroenterology;  Laterality: N/A;  . HEMOSTASIS CLIP PLACEMENT  06/13/2019   Procedure: HEMOSTASIS CLIP PLACEMENT;  Surgeon: Irving Copas., MD;  Location: [place];  Service: Gastroenterology;;  . HEMOSTASIS CONTROL  06/13/2019   Procedure: HEMOSTASIS CONTROL;  Surgeon: Irving Copas., MD;  Location: [place];  Service: Gastroenterology;;  . Chrystine Oiler SELECTIVE EACH ADDITIONAL VESSEL  06/13/2019  . IR ANGIOGRAM VISCERAL SELECTIVE  06/13/2019  . IR EMBO ART  VEN HEMORR LYMPH EXTRAV  INC GUIDE ROADMAPPING  06/13/2019  . IR US GUIDE VASC ACCESS RIGHT  06/13/2019  . PARTIAL COLECTOMY    . SCLEROTHERAPY  06/13/2019   Procedure: Clide Deutscher;  Surgeon: Mansouraty, Telford Nab., MD;  Location: [place];  Service: Gastroenterology;;  . Lia Foyer LIFTING INJECTION  06/13/2019   Procedure: SUBMUCOSAL LIFTING INJECTION;  Surgeon: Irving Copas., MD;  Location: [facility] ENDOSCOPY;  Service: Gastroenterology;;    Family History  Problem Relation Age of Onset  . Heart failure Mother   . Stroke Father   . Colon cancer Neg Hx   . Esophageal cancer Neg Hx   . Stomach cancer Neg Hx   .  Rectal cancer Neg Hx   . Inflammatory bowel disease Neg Hx   . Liver disease Neg Hx   . Pancreatic cancer Neg Hx       Social History   Socioeconomic History  . Marital status: Single    Spouse name: Not on file  . Number of children: 0  . Years of education: Not on file  . Highest education level: Not on file  Occupational History  . Occupation: retired  Tobacco Use  . Smoking status: Current Every Day Smoker     Packs/day: 1.00    Years: 44.00    Pack years: 44.00    Types: Cigarettes    Start date: 08/01/2016  . Smokeless tobacco: Never Used  Vaping Use  . Vaping Use: Never used  Substance and Sexual Activity  . Alcohol use: Yes    Alcohol/week: 0.0 standard drinks    Comment: social  . Drug use: No  . Sexual activity: Yes    Comment: declined condoms 03/27/20  Other Topics Concern  . Not on file  Social History Narrative  . Not on file   Social Determinants of Health   Financial Resource Strain: Not on file  Food Insecurity: Not on file  Transportation Needs: Not on file  Physical Activity: Not on file  Stress: Not on file  Social Connections: Not on file    No Known Allergies   Current Outpatient Medications:  .  amLODipine (NORVASC) 10 MG tablet, Take 1 tablet (10 mg total) by mouth daily., Disp: 90 tablet, Rfl: 2 .  GENVOYA 150-150-200-10 MG TABS tablet, TAKE 1 TABLET BY MOUTH EVERY DAY WITH BREAKFAST, Disp: 30 tablet, Rfl: 1    Review of Systems  Constitutional: Negative for activity change, appetite change, chills, diaphoresis, fatigue, fever and unexpected weight change.  HENT: Negative for postnasal drip, sinus pressure and trouble swallowing.   Eyes: Negative for photophobia, discharge, itching and visual disturbance.  Respiratory: Negative for chest tightness, shortness of breath and stridor.   Cardiovascular: Negative for palpitations and leg swelling.  Gastrointestinal: Negative for abdominal distention, anal bleeding, blood in stool and constipation.  Genitourinary: Negative for difficulty urinating, flank pain and hematuria.  Musculoskeletal: Negative for arthralgias, back pain, gait problem, joint swelling and myalgias.  Skin: Negative for color change, pallor and wound.  Neurological: Negative for dizziness, tremors, weakness and light-headedness.  Hematological: Negative for adenopathy. Does not bruise/bleed easily.  Psychiatric/Behavioral: Negative for  agitation, behavioral problems, confusion, decreased concentration, dysphoric mood, hallucinations, self-injury and sleep disturbance. The patient is not hyperactive.        Objective:   Physical Exam Constitutional:      General: He is not in acute distress.    Appearance: He is well-developed. He is not diaphoretic.  HENT:     Head: Normocephalic and atraumatic.     Mouth/Throat:     Pharynx: Uvula midline.  Eyes:     General: No scleral icterus.    Conjunctiva/sclera: Conjunctivae normal.  Neck:     Vascular: No JVD.     Trachea: No tracheal deviation.  Cardiovascular:     Rate and Rhythm: Normal rate and regular rhythm.  Pulmonary:     Effort: Pulmonary effort is normal. No respiratory distress.     Breath sounds: No wheezing.  Abdominal:     General: There is no distension.  Musculoskeletal:        General: No tenderness. Normal range of motion.     Cervical back: Normal range  of motion and neck supple.  Lymphadenopathy:     Cervical: No cervical adenopathy.  Skin:    General: Skin is warm and dry.     Coloration: Skin is not pale.     Findings: No erythema.  Neurological:     General: No focal deficit present.     Mental Status: He is alert and oriented to person, place, and time.     Motor: No abnormal muscle tone.     Coordination: Coordination normal.     Deep Tendon Reflexes: Reflexes are normal and symmetric.  Psychiatric:        Mood and Affect: Mood normal.        Behavior: Behavior normal.        Thought Content: Thought content normal.        Judgment: Judgment normal.           Assessment & Plan:   HIV: Switch to Dovato and recheck labs in 1 month's time.    Syphilis: Serofast at 1-8  Smoking: Continue to encourage smoking cessation Hypertension on amlodipine.  Colon cancer: Would encourage hemicolectomy.  He has follow-up with oncology.  COVID prevention he has had 3 vaccines

## 2020-04-08 ENCOUNTER — Telehealth: Payer: Self-pay

## 2020-04-08 NOTE — Telephone Encounter (Signed)
-----   Message from Alla Feeling, NP sent at 04/08/2020  2:03 PM EST ----- [place],  He has been reluctant to schedule f/up with [place]. His guardant reveal from 02/2020 is negative, no circulating tumor DNA detected which is good news, please let him know. If he agrees, please send schedule message for lab and f/up in 3-4 months.   Thanks, Regan Rakers

## 2020-04-08 NOTE — Telephone Encounter (Signed)
Called message left concerning [place] Reveal results encouraged to call back to make f/u appt and lab work

## 2020-04-22 ENCOUNTER — Other Ambulatory Visit: Payer: Medicare Other

## 2020-04-24 ENCOUNTER — Other Ambulatory Visit: Payer: Self-pay

## 2020-04-24 ENCOUNTER — Other Ambulatory Visit: Payer: Medicare Other

## 2020-04-24 DIAGNOSIS — B20 Human immunodeficiency virus [HIV] disease: Secondary | ICD-10-CM

## 2020-04-25 LAB — T-HELPER CELL (CD4) - (RCID CLINIC ONLY)
CD4 % Helper T Cell: 24 % — ABNORMAL LOW (ref 33–65)
CD4 T Cell Abs: 333 /uL — ABNORMAL LOW (ref 400–1790)

## 2020-04-25 LAB — GUARDANT 360

## 2020-04-28 LAB — CBC WITH DIFFERENTIAL/PLATELET
Absolute Monocytes: 626 cells/uL (ref 200–950)
Basophils Absolute: 20 cells/uL (ref 0–200)
Basophils Relative: 0.3 %
Eosinophils Absolute: 82 cells/uL (ref 15–500)
Eosinophils Relative: 1.2 %
HCT: 50.6 % — ABNORMAL HIGH (ref 38.5–50.0)
Hemoglobin: 17.3 g/dL — ABNORMAL HIGH (ref 13.2–17.1)
Lymphs Abs: 1605 cells/uL (ref 850–3900)
MCH: 32.5 pg (ref 27.0–33.0)
MCHC: 34.2 g/dL (ref 32.0–36.0)
MCV: 94.9 fL (ref 80.0–100.0)
MPV: 10.3 fL (ref 7.5–12.5)
Monocytes Relative: 9.2 %
Neutro Abs: 4468 cells/uL (ref 1500–7800)
Neutrophils Relative %: 65.7 %
Platelets: 225 10*3/uL (ref 140–400)
RBC: 5.33 10*6/uL (ref 4.20–5.80)
RDW: 13.2 % (ref 11.0–15.0)
Total Lymphocyte: 23.6 %
WBC: 6.8 10*3/uL (ref 3.8–10.8)

## 2020-04-28 LAB — COMPLETE METABOLIC PANEL WITH GFR
AG Ratio: 1.6 (calc) (ref 1.0–2.5)
ALT: 19 U/L (ref 9–46)
AST: 18 U/L (ref 10–35)
Albumin: 4.3 g/dL (ref 3.6–5.1)
Alkaline phosphatase (APISO): 81 U/L (ref 35–144)
BUN: 11 mg/dL (ref 7–25)
CO2: 25 mmol/L (ref 20–32)
Calcium: 9.4 mg/dL (ref 8.6–10.3)
Chloride: 103 mmol/L (ref 98–110)
Creat: 1.13 mg/dL (ref 0.70–1.25)
GFR, Est African American: 77 mL/min/{1.73_m2} (ref >=60)
GFR, Est Non African American: 66 mL/min/{1.73_m2} (ref >=60)
Globulin: 2.7 g/dL (calc) (ref 1.9–3.7)
Glucose, Bld: 89 mg/dL (ref 65–99)
Potassium: 4.8 mmol/L (ref 3.5–5.3)
Sodium: 138 mmol/L (ref 135–146)
Total Bilirubin: 0.6 mg/dL (ref 0.2–1.2)
Total Protein: 7 g/dL (ref 6.1–8.1)

## 2020-04-28 LAB — HIV-1 RNA QUANT-NO REFLEX-BLD
HIV 1 RNA Quant: 22 Copies/mL — ABNORMAL HIGH
HIV-1 RNA Quant, Log: 1.34 Log cps/mL — ABNORMAL HIGH

## 2020-05-08 ENCOUNTER — Other Ambulatory Visit: Payer: Self-pay

## 2020-05-08 ENCOUNTER — Encounter: Payer: Self-pay | Admitting: Infectious Disease

## 2020-05-08 ENCOUNTER — Ambulatory Visit (INDEPENDENT_AMBULATORY_CARE_PROVIDER_SITE_OTHER): Payer: Medicare Other | Admitting: Infectious Disease

## 2020-05-08 VITALS — BP 144/87 | HR 66 | Temp 97.6°F | Wt 172.0 lb

## 2020-05-08 DIAGNOSIS — D751 Secondary polycythemia: Secondary | ICD-10-CM

## 2020-05-08 DIAGNOSIS — F17200 Nicotine dependence, unspecified, uncomplicated: Secondary | ICD-10-CM | POA: Diagnosis not present

## 2020-05-08 DIAGNOSIS — C182 Malignant neoplasm of ascending colon: Secondary | ICD-10-CM | POA: Diagnosis not present

## 2020-05-08 DIAGNOSIS — B20 Human immunodeficiency virus [HIV] disease: Secondary | ICD-10-CM | POA: Diagnosis present

## 2020-05-08 DIAGNOSIS — A539 Syphilis, unspecified: Secondary | ICD-10-CM

## 2020-05-08 NOTE — Progress Notes (Signed)
Subjective:   Chief complaint: followup for his HIV on medications., Patient ID: [name], male    DOB: 07-Mar-1950, 70 y.o.   MRN: [id number]  HPI  70 year old Caucasian male with HIV that has been superbly well controlled on Atripla --> STRIBILD-->  GENVOYA returns for routine followup.   He has been diagnosed with stage I colon cancer and status post resection of cancerous lesions from the colon.  Apparently he was offered a hemicolectomy but decided against this due to lack of sufficient social support.  Of note he says that he has previously had a partial colectomy to treat diverticulitis and had a prolonged stay in the hospital at the time.  He d have a rising CEA antigen CT scan done in January shows some abnormalities in the colon that have been thought to be due to scarring that was seen on endoscopy.  He continues to smoke  Simplified his antiviral regimen from Genvoya to [facility] on return to clinic today for follow-up.  His viral load reminds [place] suppressed at 22 copies       Past Medical History:  Diagnosis Date  . BPH (benign prostatic hyperplasia)   . Erectile dysfunction   . History of kidney stones    passed stones  . HIV infection ([place])   . Hypertension   . Pruritic rash 04/21/2015  . Secondary polycythemia 05/26/2016  . Syphilis 04/19/2018    Past Surgical History:  Procedure Laterality Date  . BIOPSY  02/11/2020   Procedure: BIOPSY;  Surgeon: Rush Landmark Telford Nab., MD;  Location: [place];  Service: Gastroenterology;;  . COLONOSCOPY    . COLONOSCOPY WITH PROPOFOL N/A 06/13/2019   Procedure: COLONOSCOPY WITH PROPOFOL;  Surgeon: Rush Landmark Telford Nab., MD;  Location: [place];  Service: Gastroenterology;  Laterality: N/A;  . COLONOSCOPY WITH PROPOFOL N/A 02/11/2020   Procedure: COLONOSCOPY WITH PROPOFOL;  Surgeon: Rush Landmark Telford Nab., MD;  Location: [place];  Service: Gastroenterology;  Laterality: N/A;  . ENDOSCOPIC MUCOSAL RESECTION  N/A 06/13/2019   Procedure: ENDOSCOPIC MUCOSAL RESECTION;  Surgeon: Rush Landmark Telford Nab., MD;  Location: [place];  Service: Gastroenterology;  Laterality: N/A;  . HEMOSTASIS CLIP PLACEMENT  06/13/2019   Procedure: HEMOSTASIS CLIP PLACEMENT;  Surgeon: Irving Copas., MD;  Location: [place];  Service: Gastroenterology;;  . HEMOSTASIS CONTROL  06/13/2019   Procedure: HEMOSTASIS CONTROL;  Surgeon: Irving Copas., MD;  Location: [place];  Service: Gastroenterology;;  . Chrystine Oiler SELECTIVE EACH ADDITIONAL VESSEL  06/13/2019  . IR ANGIOGRAM VISCERAL SELECTIVE  06/13/2019  . IR EMBO ART  VEN HEMORR LYMPH EXTRAV  INC GUIDE ROADMAPPING  06/13/2019  . IR US GUIDE VASC ACCESS RIGHT  06/13/2019  . PARTIAL COLECTOMY    . SCLEROTHERAPY  06/13/2019   Procedure: Clide Deutscher;  Surgeon: Mansouraty, Telford Nab., MD;  Location: [place];  Service: Gastroenterology;;  . Lia Foyer LIFTING INJECTION  06/13/2019   Procedure: SUBMUCOSAL LIFTING INJECTION;  Surgeon: Irving Copas., MD;  Location: [facility] ENDOSCOPY;  Service: Gastroenterology;;    Family History  Problem Relation Age of Onset  . Heart failure Mother   . Stroke Father   . Colon cancer Neg Hx   . Esophageal cancer Neg Hx   . Stomach cancer Neg Hx   . Rectal cancer Neg Hx   . Inflammatory bowel disease Neg Hx   . Liver disease Neg Hx   . Pancreatic cancer Neg Hx       Social History   Socioeconomic History  .  Marital status: Single    Spouse name: Not on file  . Number of children: 0  . Years of education: Not on file  . Highest education level: Not on file  Occupational History  . Occupation: retired  Tobacco Use  . Smoking status: Current Every Day Smoker    Packs/day: 1.00    Years: 44.00    Pack years: 44.00    Types: Cigarettes    Start date: 08/01/2016  . Smokeless tobacco: Never Used  Vaping Use  . Vaping Use: Never used  Substance and Sexual Activity  . Alcohol use: Yes     Alcohol/week: 0.0 standard drinks    Comment: social  . Drug use: No  . Sexual activity: Yes    Comment: declined condoms 03/27/20  Other Topics Concern  . Not on file  Social History Narrative  . Not on file   Social Determinants of Health   Financial Resource Strain: Not on file  Food Insecurity: Not on file  Transportation Needs: Not on file  Physical Activity: Not on file  Stress: Not on file  Social Connections: Not on file    No Known Allergies   Current Outpatient Medications:  .  amLODipine (NORVASC) 10 MG tablet, Take 1 tablet (10 mg total) by mouth daily., Disp: 90 tablet, Rfl: 2 .  Dolutegravir-lamiVUDine (DOVATO) 50-300 MG TABS, Take 1 tablet by mouth daily., Disp: 30 tablet, Rfl: 11    Review of Systems  Constitutional: Negative for activity change, appetite change, chills, diaphoresis, fatigue, fever and unexpected weight change.  HENT: Negative for postnasal drip, sinus pressure and trouble swallowing.   Eyes: Negative for photophobia, discharge, itching and visual disturbance.  Respiratory: Negative for chest tightness, shortness of breath and stridor.   Cardiovascular: Negative for palpitations and leg swelling.  Gastrointestinal: Negative for abdominal distention, anal bleeding, blood in stool and constipation.  Genitourinary: Negative for difficulty urinating, flank pain and hematuria.  Musculoskeletal: Negative for arthralgias, back pain, gait problem, joint swelling, myalgias and neck stiffness.  Skin: Negative for color change, pallor and wound.  Neurological: Negative for dizziness, tremors, weakness and light-headedness.  Hematological: Negative for adenopathy. Does not bruise/bleed easily.  Psychiatric/Behavioral: Negative for agitation, behavioral problems, confusion, decreased concentration, dysphoric mood, hallucinations, self-injury and sleep disturbance. The patient is not hyperactive.        Objective:   Physical Exam Constitutional:       General: He is not in acute distress.    Appearance: He is well-developed. He is not diaphoretic.  HENT:     Head: Normocephalic and atraumatic.     Mouth/Throat:     Pharynx: Uvula midline.  Eyes:     General: No scleral icterus.    Extraocular Movements: Extraocular movements intact.     Conjunctiva/sclera: Conjunctivae normal.  Neck:     Vascular: No JVD.     Trachea: No tracheal deviation.  Cardiovascular:     Rate and Rhythm: Normal rate and regular rhythm.  Pulmonary:     Effort: Pulmonary effort is normal. No respiratory distress.     Breath sounds: No wheezing.  Abdominal:     General: There is no distension.  Musculoskeletal:        General: No tenderness. Normal range of motion.     Cervical back: Normal range of motion and neck supple.  Lymphadenopathy:     Cervical: No cervical adenopathy.  Skin:    General: Skin is warm and dry.     Coloration: Skin  is not pale.     Findings: No erythema.  Neurological:     General: No focal deficit present.     Mental Status: He is alert and oriented to person, place, and time.     Motor: No abnormal muscle tone.     Coordination: Coordination normal.     Deep Tendon Reflexes: Reflexes are normal and symmetric.  Psychiatric:        Mood and Affect: Mood normal.        Behavior: Behavior normal.        Thought Content: Thought content normal.        Judgment: Judgment normal.           Assessment & Plan:   HIV: Suppressed on Dovato return to clinic in 6 months to renew his SPAP program in July    Syphilis: Serofast at 1-8  Smoking: Continue to encourage smoking cessation Hypertension on amlodipine.  Colon cancer:have encouraged hemicolectomy.  He has follow-up with oncology.  Secondary polycythemia: due to smoking  I spent greater than 30 minutes with the patient including greater than 50% of time in face to face counsel of the patient and in coordination of their care.

## 2020-05-29 ENCOUNTER — Ambulatory Visit (INDEPENDENT_AMBULATORY_CARE_PROVIDER_SITE_OTHER): Payer: Medicare Other | Admitting: Family Medicine

## 2020-05-29 ENCOUNTER — Other Ambulatory Visit: Payer: Self-pay

## 2020-05-29 ENCOUNTER — Encounter: Payer: Self-pay | Admitting: Family Medicine

## 2020-05-29 DIAGNOSIS — I10 Essential (primary) hypertension: Secondary | ICD-10-CM | POA: Diagnosis not present

## 2020-05-29 MED ORDER — AMLODIPINE BESYLATE 10 MG PO TABS: 10.0000 mg | ORAL_TABLET | Freq: Every day | ORAL | 2 refills | Status: DC

## 2020-05-29 NOTE — Progress Notes (Signed)
Subjective:  Patient ID: [name], male    DOB: 08/18/1950  Age: 70 y.o. MRN: [id number]  CC:  Chief Complaint  Patient presents with  . Hypertension    Pt reports new 10 mg tablet amlodipine works well, no side effects noted and no physical symptoms reports still has occasional high but average 120-130     HPI [name] presents for   Hypertension: Crease control in October, 2021.  Amlodipine was increased to 10 mg daily.  Home readings approximately 120-130 on average. No new side effects.   Home readings: BP Readings from Last 3 Encounters:  05/29/20 128/64  05/08/20 (!) 144/87  03/27/20 135/84   Lab Results  Component Value Date   CREATININE 1.13 04/24/2020     History Patient Active Problem List   Diagnosis Date Noted  . Cancer of right colon ([place]) 12/23/2019  . Acute upper GI bleed 06/13/2019  . Cecal polyp 05/26/2019  . History of colonic polyps 05/26/2019  . Abnormal CT scan, colon 05/26/2019  . Syphilis 04/19/2018  . Secondary polycythemia 05/26/2016  . Pruritic rash 04/21/2015  . Erectile dysfunction 03/02/2012  . Allergic conjunctivitis 03/02/2012  . HTN (hypertension) 10/06/2010  . Smoking 10/06/2010  . BPH (benign prostatic hyperplasia) 10/06/2010  . COLONIC POLYPS 10/17/2008  . CYSTS OF ORAL SOFT TISSUES 10/17/2008  . DIVERTICULITIS OF COLON 10/17/2008  . Human immunodeficiency virus (HIV) disease ([place]) 06/06/2008  . THRUSH 06/06/2008  . Pancytopenia ([place]) 06/06/2008  . LUNG NODULE 06/06/2008  . RENAL CALCULUS 06/06/2008   Past Medical History:  Diagnosis Date  . BPH (benign prostatic hyperplasia)   . Erectile dysfunction   . History of kidney stones    passed stones  . HIV infection ([place])   . Hypertension   . Pruritic rash 04/21/2015  . Secondary polycythemia 05/26/2016  . Syphilis 04/19/2018   Past Surgical History:  Procedure Laterality Date  . BIOPSY  02/11/2020   Procedure: BIOPSY;  Surgeon: Rush Landmark Telford Nab., MD;   Location: [place];  Service: Gastroenterology;;  . COLONOSCOPY    . COLONOSCOPY WITH PROPOFOL N/A 06/13/2019   Procedure: COLONOSCOPY WITH PROPOFOL;  Surgeon: Rush Landmark Telford Nab., MD;  Location: [place];  Service: Gastroenterology;  Laterality: N/A;  . COLONOSCOPY WITH PROPOFOL N/A 02/11/2020   Procedure: COLONOSCOPY WITH PROPOFOL;  Surgeon: Rush Landmark Telford Nab., MD;  Location: [place];  Service: Gastroenterology;  Laterality: N/A;  . ENDOSCOPIC MUCOSAL RESECTION N/A 06/13/2019   Procedure: ENDOSCOPIC MUCOSAL RESECTION;  Surgeon: Rush Landmark Telford Nab., MD;  Location: [place];  Service: Gastroenterology;  Laterality: N/A;  . HEMOSTASIS CLIP PLACEMENT  06/13/2019   Procedure: HEMOSTASIS CLIP PLACEMENT;  Surgeon: Irving Copas., MD;  Location: [place];  Service: Gastroenterology;;  . HEMOSTASIS CONTROL  06/13/2019   Procedure: HEMOSTASIS CONTROL;  Surgeon: Irving Copas., MD;  Location: [place];  Service: Gastroenterology;;  . Chrystine Oiler SELECTIVE EACH ADDITIONAL VESSEL  06/13/2019  . IR ANGIOGRAM VISCERAL SELECTIVE  06/13/2019  . IR EMBO ART  VEN HEMORR LYMPH EXTRAV  INC GUIDE ROADMAPPING  06/13/2019  . IR US GUIDE VASC ACCESS RIGHT  06/13/2019  . PARTIAL COLECTOMY    . SCLEROTHERAPY  06/13/2019   Procedure: Clide Deutscher;  Surgeon: Mansouraty, Telford Nab., MD;  Location: [place];  Service: Gastroenterology;;  . Lia Foyer LIFTING INJECTION  06/13/2019   Procedure: SUBMUCOSAL LIFTING INJECTION;  Surgeon: Irving Copas., MD;  Location: [place];  Service: Gastroenterology;;   No Known Allergies Prior to Admission  medications   Medication Sig Start Date End Date Taking? Authorizing Provider  amLODipine (NORVASC) 10 MG tablet Take 1 tablet (10 mg total) by mouth daily. 11/29/19  Yes Wendie Agreste, MD  Dolutegravir-lamiVUDine (DOVATO) 50-300 MG TABS Take 1 tablet by mouth daily. 03/27/20  Yes Tommy Medal, Lavell Islam, MD   Social  History   Socioeconomic History  . Marital status: Single    Spouse name: Not on file  . Number of children: 0  . Years of education: Not on file  . Highest education level: Not on file  Occupational History  . Occupation: retired  Tobacco Use  . Smoking status: Current Every Day Smoker    Packs/day: 1.00    Years: 44.00    Pack years: 44.00    Types: Cigarettes    Start date: 08/01/2016  . Smokeless tobacco: Never Used  Vaping Use  . Vaping Use: Never used  Substance and Sexual Activity  . Alcohol use: Yes    Alcohol/week: 0.0 standard drinks    Comment: social  . Drug use: No  . Sexual activity: Yes    Comment: declined condoms 03/27/20  Other Topics Concern  . Not on file  Social History Narrative  . Not on file   Social Determinants of Health   Financial Resource Strain: Not on file  Food Insecurity: Not on file  Transportation Needs: Not on file  Physical Activity: Not on file  Stress: Not on file  Social Connections: Not on file  Intimate Partner Violence: Not on file    Review of Systems  Constitutional: Negative for fatigue and unexpected weight change.  Eyes: Negative for visual disturbance.  Respiratory: Negative for cough, chest tightness and shortness of breath.   Cardiovascular: Negative for chest pain, palpitations and leg swelling.  Gastrointestinal: Negative for abdominal pain and blood in stool.  Neurological: Negative for dizziness, light-headedness and headaches.     Objective:   Vitals:   05/29/20 0816  BP: 128/64  Pulse: 72  Resp: 15  Temp: 98 F (36.7 C)  TempSrc: Temporal  SpO2: 99%  Weight: 173 lb 12.8 oz (78.8 kg)  Height: 5\' 10"  (1.778 m)     Physical Exam Vitals reviewed.  Constitutional:      Appearance: He is well-developed.  HENT:     Head: Normocephalic and atraumatic.  Eyes:     Pupils: Pupils are equal, round, and reactive to light.  Neck:     Vascular: No carotid bruit or JVD.  Cardiovascular:     Rate and  Rhythm: Normal rate and regular rhythm.     Heart sounds: Normal heart sounds. No murmur heard.   Pulmonary:     Effort: Pulmonary effort is normal.     Breath sounds: Normal breath sounds. No rales.  Skin:    General: Skin is warm and dry.  Neurological:     Mental Status: He is alert and oriented to person, place, and time.        Assessment & Plan:  [name] is a 70 y.o. male . Essential hypertension - Plan: amLODipine (NORVASC) 10 MG tablet  - stable control, continue same dose amlodipine. 6 month follow up for physical.  Meds ordered this encounter  Medications  . amLODipine (NORVASC) 10 MG tablet    Sig: Take 1 tablet (10 mg total) by mouth daily.    Dispense:  90 tablet    Refill:  2   Patient Instructions  No change in meds  for now. Follow up in 6 months, but let me know if there are questions sooner.      Signed, Merri Ray, MD [facility]

## 2020-05-29 NOTE — Patient Instructions (Addendum)
No change in meds for now. Follow up in 6 months, but let me know if there are questions sooner.

## 2020-06-02 ENCOUNTER — Encounter: Payer: Self-pay | Admitting: Nurse Practitioner

## 2020-06-09 ENCOUNTER — Encounter: Payer: Self-pay | Admitting: Hematology

## 2020-06-09 ENCOUNTER — Inpatient Hospital Stay: Payer: Medicare Other | Attending: Hematology

## 2020-06-09 ENCOUNTER — Other Ambulatory Visit: Payer: Self-pay

## 2020-06-09 DIAGNOSIS — Z21 Asymptomatic human immunodeficiency virus [HIV] infection status: Secondary | ICD-10-CM | POA: Diagnosis not present

## 2020-06-09 DIAGNOSIS — F17210 Nicotine dependence, cigarettes, uncomplicated: Secondary | ICD-10-CM | POA: Diagnosis not present

## 2020-06-09 DIAGNOSIS — Z85038 Personal history of other malignant neoplasm of large intestine: Secondary | ICD-10-CM | POA: Insufficient documentation

## 2020-06-09 DIAGNOSIS — C180 Malignant neoplasm of cecum: Secondary | ICD-10-CM

## 2020-06-09 LAB — CBC WITH DIFFERENTIAL (CANCER CENTER ONLY)
Abs Immature Granulocytes: 0.03 10*3/uL (ref 0.00–0.07)
Basophils Absolute: 0 10*3/uL (ref 0.0–0.1)
Basophils Relative: 0 %
Eosinophils Absolute: 0.1 10*3/uL (ref 0.0–0.5)
Eosinophils Relative: 2 %
HCT: 51.1 % (ref 39.0–52.0)
Hemoglobin: 17.3 g/dL — ABNORMAL HIGH (ref 13.0–17.0)
Immature Granulocytes: 1 %
Lymphocytes Relative: 26 %
Lymphs Abs: 1.7 10*3/uL (ref 0.7–4.0)
MCH: 33 pg (ref 26.0–34.0)
MCHC: 33.9 g/dL (ref 30.0–36.0)
MCV: 97.5 fL (ref 80.0–100.0)
Monocytes Absolute: 0.6 10*3/uL (ref 0.1–1.0)
Monocytes Relative: 8 %
Neutro Abs: 4.2 10*3/uL (ref 1.7–7.7)
Neutrophils Relative %: 63 %
Platelet Count: 225 10*3/uL (ref 150–400)
RBC: 5.24 MIL/uL (ref 4.22–5.81)
RDW: 13.4 % (ref 11.5–15.5)
WBC Count: 6.6 10*3/uL (ref 4.0–10.5)
nRBC: 0 % (ref 0.0–0.2)

## 2020-06-09 LAB — CMP (CANCER CENTER ONLY)
ALT: 19 U/L (ref 0–44)
AST: 19 U/L (ref 15–41)
Albumin: 4.1 g/dL (ref 3.5–5.0)
Alkaline Phosphatase: 85 U/L (ref 38–126)
Anion gap: 10 (ref 5–15)
BUN: 9 mg/dL (ref 8–23)
CO2: 23 mmol/L (ref 22–32)
Calcium: 9.3 mg/dL (ref 8.9–10.3)
Chloride: 107 mmol/L (ref 98–111)
Creatinine: 1.1 mg/dL (ref 0.61–1.24)
GFR, Estimated: >60 mL/min (ref >60)
Glucose, Bld: 98 mg/dL (ref 70–99)
Potassium: 4.6 mmol/L (ref 3.5–5.1)
Sodium: 140 mmol/L (ref 135–145)
Total Bilirubin: 0.5 mg/dL (ref 0.3–1.2)
Total Protein: 7.6 g/dL (ref 6.5–8.1)

## 2020-06-09 LAB — CEA (IN HOUSE-CHCC): CEA (CHCC-In House): 4.61 ng/mL (ref 0.00–5.00)

## 2020-06-09 NOTE — Progress Notes (Signed)
[name]   [phone] Fax:([phone]) [phone]   Clinic Follow up Note   Patient Care Team: Wendie Agreste, MD as PCP - General (Family Medicine) Tommy Medal, Lavell Islam, MD as PCP - Infectious Diseases (Infectious Diseases) Jonnie Finner, RN as Oncology Nurse Navigator Truitt Merle, MD as Consulting Physician (Hematology) Alla Feeling, NP as Nurse Practitioner (Nurse Practitioner) Mansouraty, Telford Nab., MD as Consulting Physician (Gastroenterology)   I connected with [name] on 06/13/2020 at  9:40 AM EDT by telephone visit and verified that I am speaking with the correct person using two identifiers.  I discussed the limitations, risks, security and privacy concerns of performing an evaluation and management service by telephone and the availability of in person appointments. I also discussed with the patient that there may be a patient responsible charge related to this service. The patient expressed understanding and agreed to proceed.   Other persons participating in the visit and their role in the encounter:  None  Patient's location:  His home Provider's location:  My office   CHIEF COMPLAINT: F/u of colon cancer  SUMMARY OF ONCOLOGIC HISTORY: Oncology History Overview Note  Cancer Staging Cancer of right colon [facility]) Staging form: Colon and Rectum, AJCC 8th Edition - Clinical stage from 06/13/2019: Stage I (cT1, cN0, cM0) - Signed by Truitt Merle, MD on 12/23/2019    Cancer of right colon [facility])  03/13/2019 Procedure   Colonoscopy by Dr Havery Moros IMPRESSION - The examined portion of the ileum was normal. - Polypoid lesion in the cecum overlying the appendiceal orifice as outlined. Biopsied. - Diverticulosis in the sigmoid colon and in the cecum. - Internal hemorrhoids. - The examination was otherwise normal.     Diagnosis Surgical [P], colon, cecum polyp/polypoid lesion - SUPERFICIAL FRAGMENTS OF TUBULOVILLOUS ADENOMA WITH FOCAL  HIGH-GRADE DYSPLASIA - NEGATIVE FOR CARCINOMA   03/28/2019 Imaging   CT AP W contrast  IMPRESSION: 1. 3.5 cm cecal mass, corresponding to the colonoscopy abnormality. No pericolonic extension or adjacent adenopathy. 2. Hepatic morphology which is suspicious for mild cirrhosis. Recannulized paraumbilical vein, suggesting portal venous hypertension. 3.  Aortic Atherosclerosis (ICD10-I70.0). 4. Mild prostatomegaly.   06/13/2019 Cancer Staging   Staging form: Colon and Rectum, AJCC 8th Edition - Clinical stage from 06/13/2019: Stage I (cT1, cN0, cM0) - Signed by Truitt Merle, MD on 12/23/2019   06/13/2019 Procedure   Colonoscopy for polypectomy by Dr Rush Landmark  IMPRESSION - Hemorrhoids found on digital rectal exam. - The examined portion of the ileum was normal. - One 45-50 mm polyp in the cecum, removed with piecemeal mucosal resection. Resected and retrieved. Bleeding was noted during the procedure. Injected with Epinephrine as above. Clips (MR conditional) were placed. Persistent oozing noted. Hemostatic spray applied. - Diverticulosis in the recto-sigmoid colon, in the sigmoid colon, in the descending colon and in the ascending colon. - Non-bleeding non-thrombosed external and internal hemorrhoids   06/13/2019 Initial Biopsy   FINAL MICROSCOPIC DIAGNOSIS:   A. COLON, CECUM, POLYPECTOMY:  - Invasive adenocarcinoma, poorly differentiated, scattered small foci.  - Adenocarcinoma extends into superficial submucosa (depth of 0.3 cm).  - The surgical resection margins are negative for carcinoma.  - See oncology table below.    Mismatch Repair Protein (IHC)   SUMMARY INTERPRETATION: NORMAL    IHC EXPRESSION RESULTS   TEST           RESULT  MLH1:          Preserved nuclear expression  MSH2:  Preserved nuclear expression  MSH6:          Preserved nuclear expression  PMS2:          Preserved nuclear expression    06/19/2019 Imaging   CT Chest  IMPRESSION: 1. No evidence  of metastatic disease in the chest. 2. Finely irregular liver surface suggests cirrhosis. 3. Aortic Atherosclerosis (ICD10-I70.0) and Emphysema (ICD10-J43.9).       12/23/2019 Initial Diagnosis   Cancer of right colon ([place])   02/11/2020 Procedure   Colonoscopy by Dr. Rush Landmark  -Hemorrhoids found on digital rectal exam. - The examined portion of the ileum was normal. - Post mucosectomy scar in the cecum with areas of likely clip artifact noted in the cecal base though potential for residual adenoma is present. The cecal base was resected with a cold snare. - Diverticulosis in the recto-sigmoid colon and in the sigmoid colon. - Normal mucosa in the entire examined colon otherwise. - Non-bleeding non-thrombosed external and internal hemorrhoids   02/11/2020 Pathology Results   FINAL MICROSCOPIC DIAGNOSIS:  A. COLON, CECUM BASE, BIOPSY:  - Colonic mucosa with benign lymphoid aggregates.  - No dysplasia, adenomatous change or carcinoma.    02/21/2020 Imaging   CT CAP  IMPRESSION: 1. Persistent soft tissue prominence in the base of the cecum. The possibility of residual tumor should be considered, and correlation with repeat colonoscopy is suggested if clinically appropriate. 2. No definitive findings to suggest metastatic disease in the chest, abdomen or pelvis. 3. Morphologic changes in the liver indicative of underlying cirrhosis. 4. Aortic atherosclerosis. 5. There are calcifications of the aortic valve and mitral valve. Echocardiographic correlation for evaluation of potential valvular dysfunction may be warranted if clinically indicated. 6. Additional incidental findings, as above      CURRENT THERAPY:  Observation   INTERVAL HISTORY:  [name] presents for a virtual follow up. They identified themselves by birth date. He notes he is doing well with no major concerns.    REVIEW OF SYSTEMS:   Constitutional: Denies fevers, chills or abnormal weight loss Eyes:  Denies blurriness of vision Ears, nose, mouth, throat, and face: Denies mucositis or sore throat Respiratory: Denies cough, dyspnea or wheezes Cardiovascular: Denies palpitation, chest discomfort or lower extremity swelling Gastrointestinal:  Denies nausea, heartburn or change in bowel habits Skin: Denies abnormal skin rashes Lymphatics: Denies new lymphadenopathy or easy bruising Neurological:Denies numbness, tingling or new weaknesses Behavioral/Psych: Blackwell is stable, no new changes  All other systems were reviewed with the patient and are negative.  MEDICAL HISTORY:  Past Medical History:  Diagnosis Date  . BPH (benign prostatic hyperplasia)   . Erectile dysfunction   . History of kidney stones    passed stones  . HIV infection ([place])   . Hypertension   . Pruritic rash 04/21/2015  . Secondary polycythemia 05/26/2016  . Syphilis 04/19/2018    SURGICAL HISTORY: Past Surgical History:  Procedure Laterality Date  . BIOPSY  02/11/2020   Procedure: BIOPSY;  Surgeon: Rush Landmark Telford Nab., MD;  Location: [place];  Service: Gastroenterology;;  . COLONOSCOPY    . COLONOSCOPY WITH PROPOFOL N/A 06/13/2019   Procedure: COLONOSCOPY WITH PROPOFOL;  Surgeon: Rush Landmark Telford Nab., MD;  Location: [place];  Service: Gastroenterology;  Laterality: N/A;  . COLONOSCOPY WITH PROPOFOL N/A 02/11/2020   Procedure: COLONOSCOPY WITH PROPOFOL;  Surgeon: Rush Landmark Telford Nab., MD;  Location: [place];  Service: Gastroenterology;  Laterality: N/A;  . ENDOSCOPIC MUCOSAL RESECTION N/A 06/13/2019   Procedure: ENDOSCOPIC MUCOSAL RESECTION;  Surgeon: Irving Copas., MD;  Location: [place];  Service: Gastroenterology;  Laterality: N/A;  . HEMOSTASIS CLIP PLACEMENT  06/13/2019   Procedure: HEMOSTASIS CLIP PLACEMENT;  Surgeon: Irving Copas., MD;  Location: [place];  Service: Gastroenterology;;  . HEMOSTASIS CONTROL  06/13/2019   Procedure: HEMOSTASIS CONTROL;  Surgeon:  Irving Copas., MD;  Location: [place];  Service: Gastroenterology;;  . Chrystine Oiler SELECTIVE EACH ADDITIONAL VESSEL  06/13/2019  . IR ANGIOGRAM VISCERAL SELECTIVE  06/13/2019  . IR EMBO ART  VEN HEMORR LYMPH EXTRAV  INC GUIDE ROADMAPPING  06/13/2019  . IR US GUIDE VASC ACCESS RIGHT  06/13/2019  . PARTIAL COLECTOMY    . SCLEROTHERAPY  06/13/2019   Procedure: Clide Deutscher;  Surgeon: Mansouraty, Telford Nab., MD;  Location: [place];  Service: Gastroenterology;;  . Lia Foyer LIFTING INJECTION  06/13/2019   Procedure: SUBMUCOSAL LIFTING INJECTION;  Surgeon: Irving Copas., MD;  Location: [place];  Service: Gastroenterology;;    I have reviewed the social history and family history with the patient and they are unchanged from previous note.  ALLERGIES:  has No Known Allergies.  MEDICATIONS:  Current Outpatient Medications  Medication Sig Dispense Refill  . amLODipine (NORVASC) 10 MG tablet Take 1 tablet (10 mg total) by mouth daily. 90 tablet 2  . Dolutegravir-lamiVUDine (DOVATO) 50-300 MG TABS Take 1 tablet by mouth daily. 30 tablet 11   No current facility-administered medications for this visit.    PHYSICAL EXAMINATION: ECOG PERFORMANCE STATUS: 0 - Asymptomatic  No vitals taken today, Exam not performed today   LABORATORY DATA:  I have reviewed the data as listed CBC Latest Ref Rng & Units 06/09/2020 04/24/2020 03/06/2020  WBC 4.0 - 10.5 K/uL 6.6 6.8 6.1  Hemoglobin 13.0 - 17.0 g/dL 17.3(H) 17.3(H) 17.7(H)  Hematocrit 39.0 - 52.0 % 51.1 50.6(H) 49.9  Platelets 150 - 400 K/uL 225 225 220     CMP Latest Ref Rng & Units 06/09/2020 04/24/2020 03/06/2020  Glucose 70 - 99 mg/dL 98 89 78  BUN 8 - 23 mg/dL $Remove'9 11 11  'nKbjbtk$ Creatinine 0.61 - 1.24 mg/dL 1.10 1.13 1.15  Sodium 135 - 145 mmol/L 140 138 139  Potassium 3.5 - 5.1 mmol/L 4.6 4.8 4.6  Chloride 98 - 111 mmol/L 107 103 103  CO2 22 - 32 mmol/L $RemoveB'23 25 24  'vWPTJzvk$ Calcium 8.9 - 10.3 mg/dL 9.3 9.4 9.8  Total Protein 6.5 -  8.1 g/dL 7.6 7.0 7.2  Total Bilirubin 0.3 - 1.2 mg/dL 0.5 0.6 0.5  Alkaline Phos 38 - 126 U/L 85 - -  AST 15 - 41 U/L $Remo'19 18 16  'rEtDL$ ALT 0 - 44 U/L $Remo'19 19 17      'xZAWJ$ RADIOGRAPHIC STUDIES: I have personally reviewed the radiological images as listed and agreed with the findings in the report. No results found.   ASSESSMENT & PLAN:  SHERON ROBIN is a 70 y.o. male with    1. Adenocarcinoma of the cecum, poorly differentiated, pT1NXM0, stage I, MMR normal  -He has a large cecal polyp on first screening colonoscopy in 03/2019 which was negative for malignancy, repeat colonoscopy 3 months later in 06/2019 showed multiple foci of poorly differentiated adenocarcinoma extending into superficial submucosa to a depth of 0.3, when it was resected by polypectomy. CT is negative for metastatic disease.  -Pt declined surgery with hemicolectomy due to lack of support after surgery. -He understands he will need close surveillance going forward. His 02/21/20 Surveillance Colonoscopy with Dr. Rush Landmark was benign  and 02/21/20 CT CAP showed No definitive findings to suggest metastatic disease in the chest, abdomen or pelvis and Persistent soft tissue prominence in the base of the cecum. I reviewed with patient today.  -He is clinically doing well with no major concerns. His recent labs show Hg 17.3 (related to his smoking), CEA normalized.  I reviewed with him  -Continue close surveillance. Next colonoscopy in 02/2021 with Dr Rush Landmark. Will f/u clinically. No routine surveillance CT scan needed -F/u in 6 months.   2. HIV -diagnosed 1984, controlled per patient -on Genvoya per Dr. Tommy Medal   PLAN: -Lab and F/u in 6 months    No problem-specific Assessment & Plan notes found for this encounter.   No orders of the defined types were placed in this encounter.  I discussed the assessment and treatment plan with the patient. The patient was provided an opportunity to ask questions and all were answered. The  patient agreed with the plan and demonstrated an understanding of the instructions.  The patient was advised to call back or seek an in-person evaluation if the symptoms worsen or if the condition fails to improve as anticipated.  The total time spent in the appointment was 11 minutes.    Truitt Merle, MD 06/13/2020   I, Joslyn Devon, am acting as scribe for Truitt Merle, MD.   I have reviewed the above documentation for accuracy and completeness, and I agree with the above.

## 2020-06-13 ENCOUNTER — Inpatient Hospital Stay (HOSPITAL_BASED_OUTPATIENT_CLINIC_OR_DEPARTMENT_OTHER): Payer: Medicare Other | Admitting: Hematology

## 2020-06-13 DIAGNOSIS — C182 Malignant neoplasm of ascending colon: Secondary | ICD-10-CM

## 2020-06-14 ENCOUNTER — Encounter: Payer: Self-pay | Admitting: Hematology

## 2020-06-16 ENCOUNTER — Telehealth: Payer: Self-pay | Admitting: Hematology

## 2020-06-16 NOTE — Telephone Encounter (Signed)
Scheduled follow-up appointment per 5/13 los. Patient is aware. 

## 2020-08-07 ENCOUNTER — Ambulatory Visit: Payer: Medicare Other

## 2020-08-07 ENCOUNTER — Encounter: Payer: Self-pay | Admitting: Infectious Disease

## 2020-08-07 ENCOUNTER — Other Ambulatory Visit: Payer: Self-pay

## 2020-11-07 ENCOUNTER — Other Ambulatory Visit (HOSPITAL_COMMUNITY)
Admission: RE | Admit: 2020-11-07 | Discharge: 2020-11-07 | Disposition: A | Discharge status: Home / Self Care | Payer: Medicare Other | Source: Ambulatory Visit | Attending: Infectious Disease | Admitting: Infectious Disease

## 2020-11-07 ENCOUNTER — Ambulatory Visit (INDEPENDENT_AMBULATORY_CARE_PROVIDER_SITE_OTHER): Payer: Medicare Other | Admitting: Infectious Disease

## 2020-11-07 ENCOUNTER — Other Ambulatory Visit: Payer: Self-pay

## 2020-11-07 VITALS — BP 143/83 | HR 65 | Resp 16 | Ht 70.0 in | Wt 178.0 lb

## 2020-11-07 DIAGNOSIS — I10 Essential (primary) hypertension: Secondary | ICD-10-CM | POA: Diagnosis not present

## 2020-11-07 DIAGNOSIS — A539 Syphilis, unspecified: Secondary | ICD-10-CM | POA: Diagnosis not present

## 2020-11-07 DIAGNOSIS — C182 Malignant neoplasm of ascending colon: Secondary | ICD-10-CM

## 2020-11-07 DIAGNOSIS — B20 Human immunodeficiency virus [HIV] disease: Secondary | ICD-10-CM | POA: Insufficient documentation

## 2020-11-07 MED ORDER — DOVATO 50-300 MG PO TABS: 1.0000 | ORAL_TABLET | Freq: Every day | ORAL | 11 refills | Status: DC

## 2020-11-07 NOTE — Addendum Note (Signed)
Addended by: Caffie Pinto on: 11/07/2020 09:44 AM   Modules accepted: Orders

## 2020-11-07 NOTE — Progress Notes (Signed)
Subjective:   Chief complaint: followup for HIV disease on medications    ., Patient ID: [name], male    DOB: [birth year], 71 y.o.   MRN: [id number]  HPI  70 year old Caucasian male with HIV that has been superbly well controlled on Atripla --> STRIBILD-->  GENVOYA returns for routine followup.   He has been diagnosed with stage I colon cancer and status post resection of cancerous lesions from the colon.  Apparently he was offered a hemicolectomy but decided against this due to lack of sufficient social support.   Been followed closely by Dr. Burr Medico and had no evidence of recurrent disease.        Past Medical History:  Diagnosis Date   BPH (benign prostatic hyperplasia)    Erectile dysfunction    History of kidney stones    passed stones   HIV infection ([place])    Hypertension    Pruritic rash 04/21/2015   Secondary polycythemia 05/26/2016   Syphilis 04/19/2018    Past Surgical History:  Procedure Laterality Date   BIOPSY  02/11/2020   Procedure: BIOPSY;  Surgeon: Irving Copas., MD;  Location: [place];  Service: Gastroenterology;;   COLONOSCOPY     COLONOSCOPY WITH PROPOFOL N/A 06/13/2019   Procedure: COLONOSCOPY WITH PROPOFOL;  Surgeon: Irving Copas., MD;  Location: [place];  Service: Gastroenterology;  Laterality: N/A;   COLONOSCOPY WITH PROPOFOL N/A 02/11/2020   Procedure: COLONOSCOPY WITH PROPOFOL;  Surgeon: Rush Landmark Telford Nab., MD;  Location: [place];  Service: Gastroenterology;  Laterality: N/A;   ENDOSCOPIC MUCOSAL RESECTION N/A 06/13/2019   Procedure: ENDOSCOPIC MUCOSAL RESECTION;  Surgeon: Rush Landmark Telford Nab., MD;  Location: [place];  Service: Gastroenterology;  Laterality: N/A;   HEMOSTASIS CLIP PLACEMENT  06/13/2019   Procedure: HEMOSTASIS CLIP PLACEMENT;  Surgeon: Irving Copas., MD;  Location: [place];  Service: Gastroenterology;;   HEMOSTASIS CONTROL  06/13/2019   Procedure: HEMOSTASIS  CONTROL;  Surgeon: Irving Copas., MD;  Location: [place];  Service: Gastroenterology;;   IR ANGIOGRAM SELECTIVE EACH ADDITIONAL VESSEL  06/13/2019   IR ANGIOGRAM VISCERAL SELECTIVE  06/13/2019   IR EMBO ART  VEN HEMORR LYMPH EXTRAV  [place]  06/13/2019   IR US GUIDE VASC ACCESS RIGHT  06/13/2019   PARTIAL COLECTOMY     SCLEROTHERAPY  06/13/2019   Procedure: SCLEROTHERAPY;  Surgeon: Mansouraty, Telford Nab., MD;  Location: [place];  Service: Gastroenterology;;   SUBMUCOSAL LIFTING INJECTION  06/13/2019   Procedure: SUBMUCOSAL LIFTING INJECTION;  Surgeon: Irving Copas., MD;  Location: [facility];  Service: Gastroenterology;;    Family History  Problem Relation Age of Onset   Heart failure Mother    Stroke Father    Colon cancer Neg Hx    Esophageal cancer Neg Hx    Stomach cancer Neg Hx    Rectal cancer Neg Hx    Inflammatory bowel disease Neg Hx    Liver disease Neg Hx    Pancreatic cancer Neg Hx       Social History   Socioeconomic History   Marital status: Single    Spouse name: Not on file   Number of children: 0   Years of education: Not on file   Highest education level: Not on file  Occupational History   Occupation: retired  Tobacco Use   Smoking status: Every Day    Packs/day: 1.00    Years: 44.00    Pack years: 44.00    Types: Cigarettes  Start date: 08/01/2016   Smokeless tobacco: Never  Vaping Use   Vaping Use: Never used  Substance and Sexual Activity   Alcohol use: Yes    Alcohol/week: 0.0 standard drinks    Comment: social   Drug use: No   Sexual activity: Yes    Comment: declined condoms 03/27/20  Other Topics Concern   Not on file  Social History Narrative   Not on file   Social Determinants of Health   Financial Resource Strain: Not on file  Food Insecurity: Not on file  Transportation Needs: Not on file  Physical Activity: Not on file  Stress: Not on file  Social Connections: Not on file    No  Known Allergies   Current Outpatient Medications:    amLODipine (NORVASC) 10 MG tablet, Take 1 tablet (10 mg total) by mouth daily., Disp: 90 tablet, Rfl: 2   Dolutegravir-lamiVUDine (DOVATO) 50-300 MG TABS, Take 1 tablet by mouth daily., Disp: 30 tablet, Rfl: 11    Review of Systems  Constitutional:  Negative for activity change, appetite change, chills, diaphoresis, fatigue, fever and unexpected weight change.  HENT:  Negative for congestion, rhinorrhea, sinus pressure, sneezing, sore throat and trouble swallowing.   Eyes:  Negative for photophobia and visual disturbance.  Respiratory:  Negative for cough, chest tightness, shortness of breath, wheezing and stridor.   Cardiovascular:  Negative for chest pain, palpitations and leg swelling.  Gastrointestinal:  Negative for abdominal distention, abdominal pain, anal bleeding, blood in stool, constipation, diarrhea, nausea and vomiting.  Genitourinary:  Negative for difficulty urinating, dysuria, flank pain and hematuria.  Musculoskeletal:  Negative for arthralgias, back pain, gait problem, joint swelling and myalgias.  Skin:  Negative for color change, pallor, rash and wound.  Neurological:  Negative for dizziness, tremors, weakness and light-headedness.  Hematological:  Negative for adenopathy. Does not bruise/bleed easily.  Psychiatric/Behavioral:  Negative for agitation, behavioral problems, confusion, decreased concentration, dysphoric mood and sleep disturbance.       Objective:   Physical Exam Constitutional:      Appearance: He is well-developed.  HENT:     Head: Normocephalic and atraumatic.  Eyes:     Conjunctiva/sclera: Conjunctivae normal.  Cardiovascular:     Rate and Rhythm: Normal rate and regular rhythm.  Pulmonary:     Effort: Pulmonary effort is normal. No respiratory distress.     Breath sounds: No wheezing.  Abdominal:     General: There is no distension.     Palpations: Abdomen is soft.  Musculoskeletal:         General: No tenderness. Normal range of motion.     Cervical back: Normal range of motion and neck supple.  Skin:    General: Skin is warm and dry.     Coloration: Skin is not pale.     Findings: No erythema or rash.  Neurological:     General: No focal deficit present.     Mental Status: He is alert and oriented to person, place, and time.  Psychiatric:        Mood and Affect: Mood normal.        Behavior: Behavior normal.        Thought Content: Thought content normal.        Judgment: Judgment normal.           HIV disease:  I am checking a viral load CD4 count CMP CBC with differential RPR  I am continuing his prescription of Dovato  Hypertension he continues  on amlodipine  Colon cancer: has not had evidence of recurrence  Vaccine counseling: He has had updated bivalent booster, and annual flu shot. He wanted Monkeypox but we are not giving the first doses in clinic

## 2020-11-10 LAB — URINE CYTOLOGY ANCILLARY ONLY
Chlamydia: NEGATIVE
Comment: NEGATIVE
Comment: NORMAL
Neisseria Gonorrhea: NEGATIVE

## 2020-11-11 LAB — CBC WITH DIFFERENTIAL/PLATELET
Absolute Monocytes: 480 cells/uL (ref 200–950)
Basophils Absolute: 30 cells/uL (ref 0–200)
Basophils Relative: 0.5 %
Eosinophils Absolute: 102 cells/uL (ref 15–500)
Eosinophils Relative: 1.7 %
HCT: 52.4 % — ABNORMAL HIGH (ref 38.5–50.0)
Hemoglobin: 18.4 g/dL — ABNORMAL HIGH (ref 13.2–17.1)
Lymphs Abs: 1938 cells/uL (ref 850–3900)
MCH: 34 pg — ABNORMAL HIGH (ref 27.0–33.0)
MCHC: 35.1 g/dL (ref 32.0–36.0)
MCV: 96.9 fL (ref 80.0–100.0)
MPV: 10.3 fL (ref 7.5–12.5)
Monocytes Relative: 8 %
Neutro Abs: 3450 cells/uL (ref 1500–7800)
Neutrophils Relative %: 57.5 %
Platelets: 229 10*3/uL (ref 140–400)
RBC: 5.41 10*6/uL (ref 4.20–5.80)
RDW: 13.3 % (ref 11.0–15.0)
Total Lymphocyte: 32.3 %
WBC: 6 10*3/uL (ref 3.8–10.8)

## 2020-11-11 LAB — RPR: RPR Ser Ql: REACTIVE — AB

## 2020-11-11 LAB — COMPLETE METABOLIC PANEL WITH GFR
AG Ratio: 1.7 (calc) (ref 1.0–2.5)
ALT: 21 U/L (ref 9–46)
AST: 18 U/L (ref 10–35)
Albumin: 4.5 g/dL (ref 3.6–5.1)
Alkaline phosphatase (APISO): 78 U/L (ref 35–144)
BUN: 11 mg/dL (ref 7–25)
CO2: 27 mmol/L (ref 20–32)
Calcium: 9.5 mg/dL (ref 8.6–10.3)
Chloride: 105 mmol/L (ref 98–110)
Creat: 1.11 mg/dL (ref 0.70–1.35)
Globulin: 2.7 g/dL (calc) (ref 1.9–3.7)
Glucose, Bld: 92 mg/dL (ref 65–99)
Potassium: 4.7 mmol/L (ref 3.5–5.3)
Sodium: 137 mmol/L (ref 135–146)
Total Bilirubin: 0.7 mg/dL (ref 0.2–1.2)
Total Protein: 7.2 g/dL (ref 6.1–8.1)
eGFR: 72 mL/min/{1.73_m2} (ref >=60)

## 2020-11-11 LAB — T-HELPER CELLS (CD4) COUNT (NOT AT ARMC)
Absolute CD4: 496 cells/uL (ref 490–1740)
CD4 T Helper %: 25 % — ABNORMAL LOW (ref 30–61)
Total lymphocyte count: 2000 cells/uL (ref 850–3900)

## 2020-11-11 LAB — HIV-1 RNA QUANT-NO REFLEX-BLD
HIV 1 RNA Quant: NOT DETECTED Copies/mL
HIV-1 RNA Quant, Log: NOT DETECTED Log cps/mL

## 2020-11-11 LAB — RPR TITER: RPR Titer: 1:4 {titer} — ABNORMAL HIGH

## 2020-11-11 LAB — FLUORESCENT TREPONEMAL AB(FTA)-IGG-BLD: Fluorescent Treponemal ABS: REACTIVE — AB

## 2020-11-26 ENCOUNTER — Other Ambulatory Visit: Payer: Self-pay

## 2020-11-26 ENCOUNTER — Encounter: Payer: Self-pay | Admitting: Family Medicine

## 2020-11-26 ENCOUNTER — Ambulatory Visit (INDEPENDENT_AMBULATORY_CARE_PROVIDER_SITE_OTHER): Payer: Medicare Other | Admitting: Family Medicine

## 2020-11-26 VITALS — BP 126/70 | HR 68 | Temp 97.9°F | Resp 16 | Ht 70.0 in | Wt 173.4 lb

## 2020-11-26 DIAGNOSIS — Z13220 Encounter for screening for lipoid disorders: Secondary | ICD-10-CM | POA: Diagnosis not present

## 2020-11-26 DIAGNOSIS — Z131 Encounter for screening for diabetes mellitus: Secondary | ICD-10-CM

## 2020-11-26 DIAGNOSIS — I10 Essential (primary) hypertension: Secondary | ICD-10-CM

## 2020-11-26 DIAGNOSIS — Z0000 Encounter for general adult medical examination without abnormal findings: Secondary | ICD-10-CM | POA: Diagnosis not present

## 2020-11-26 DIAGNOSIS — I700 Atherosclerosis of aorta: Secondary | ICD-10-CM | POA: Diagnosis not present

## 2020-11-26 DIAGNOSIS — D582 Other hemoglobinopathies: Secondary | ICD-10-CM | POA: Diagnosis not present

## 2020-11-26 DIAGNOSIS — Z130 Encounter for screening for diseases of the blood and blood-forming organs and certain disorders involving the immune mechanism: Secondary | ICD-10-CM

## 2020-11-26 LAB — COMPREHENSIVE METABOLIC PANEL
ALT: 20 U/L (ref 0–53)
AST: 21 U/L (ref 0–37)
Albumin: 4.5 g/dL (ref 3.5–5.2)
Alkaline Phosphatase: 73 U/L (ref 39–117)
BUN: 11 mg/dL (ref 6–23)
CO2: 30 mEq/L (ref 19–32)
Calcium: 9.5 mg/dL (ref 8.4–10.5)
Chloride: 104 mEq/L (ref 96–112)
Creatinine, Ser: 1.2 mg/dL (ref 0.40–1.50)
GFR: 61.93 mL/min (ref >60.00)
Glucose, Bld: 84 mg/dL (ref 70–99)
Potassium: 4.9 mEq/L (ref 3.5–5.1)
Sodium: 138 mEq/L (ref 135–145)
Total Bilirubin: 0.7 mg/dL (ref 0.2–1.2)
Total Protein: 7.3 g/dL (ref 6.0–8.3)

## 2020-11-26 LAB — CBC WITH DIFFERENTIAL/PLATELET
Basophils Absolute: 0 10*3/uL (ref 0.0–0.1)
Basophils Relative: 0.3 % (ref 0.0–3.0)
Eosinophils Absolute: 0.1 10*3/uL (ref 0.0–0.7)
Eosinophils Relative: 2.3 % (ref 0.0–5.0)
HCT: 52.3 % — ABNORMAL HIGH (ref 39.0–52.0)
Hemoglobin: 17.5 g/dL — ABNORMAL HIGH (ref 13.0–17.0)
Lymphocytes Relative: 27.6 % (ref 12.0–46.0)
Lymphs Abs: 1.3 10*3/uL (ref 0.7–4.0)
MCHC: 33.5 g/dL (ref 30.0–36.0)
MCV: 99.8 fl (ref 78.0–100.0)
Monocytes Absolute: 0.4 10*3/uL (ref 0.1–1.0)
Monocytes Relative: 8.9 % (ref 3.0–12.0)
Neutro Abs: 2.9 10*3/uL (ref 1.4–7.7)
Neutrophils Relative %: 60.9 % (ref 43.0–77.0)
Platelets: 194 10*3/uL (ref 150.0–400.0)
RBC: 5.24 Mil/uL (ref 4.22–5.81)
RDW: 14.1 % (ref 11.5–15.5)
WBC: 4.7 10*3/uL (ref 4.0–10.5)

## 2020-11-26 LAB — LIPID PANEL
Cholesterol: 157 mg/dL (ref 0–200)
HDL: 42.7 mg/dL (ref >39.00)
LDL Cholesterol: 101 mg/dL — ABNORMAL HIGH (ref 0–99)
NonHDL: 114.75
Total CHOL/HDL Ratio: 4
Triglycerides: 71 mg/dL (ref 0.0–149.0)
VLDL: 14.2 mg/dL (ref 0.0–40.0)

## 2020-11-26 MED ORDER — AMLODIPINE BESYLATE 10 MG PO TABS: 10.0000 mg | ORAL_TABLET | Freq: Every day | ORAL | 2 refills | Status: DC

## 2020-11-26 NOTE — Progress Notes (Signed)
Subjective:  Patient ID: [name], male    DOB: 01-12-1951  Age: 70 y.o. MRN: [id number]  CC:  Chief Complaint  Patient presents with   Medicare Wellness    Pt here for annual exam, no concerns today, no refills needed at this time    Atherosclerosis of aorta     HPI [name] presents for   Presents for annual wellness exam.   Care team: PCP: me Infectious disease, Dr. Tommy Medal Oncology Dr. Burr Medico Gastroenterology, Dr. Rush Landmark (adenocarcinoma of cecum)  Retired. Hiking with [employer].   Hypertension: Amlodipine 10 mg daily. No new leg swelling or side effects.  Home readings:120/70 range.  BP Readings from Last 3 Encounters:  11/26/20 126/70  11/07/20 (!) 143/83  05/29/20 128/64   Lab Results  Component Value Date   CREATININE 1.11 11/07/2020   HIV disease Followed by infectious disease, Dr. Tommy Medal Takes Dovato HIV RNA not detected on 11/07/2020, CD4 496  Elevated hemoglobin 18.4 on 11/07/2020 previously 17.3 in May.  No testosterone, no supplements.  Drinks gallon tea per day. Min water.   Right colon cancer Diagnosed with polypectomy in May 2021.  CT negative for metastatic disease.  Declined hemicolectomy.  Undergoing surveillance colonoscopy.  Next scheduled in January 2023.  29-month follow-up on May 13 discussion with hematology. CEA normalized 06/09/20.   Aortic atherosclerosis Noted on imaging with CT chest February 21, 2020.  Some calcifications of aortic valve and mitral valve.  No current statin.  No known history of CAD/PAD. No CP with exertion, no claudication.  FH: father with CVA at 34 yo, no FH of heart disease.  Discussed cardiology referral for above including valve calcifications - declined.   Lab Results  Component Value Date   CHOL 161 03/06/2020   HDL 49 03/06/2020   LDLCALC 90 03/06/2020   TRIG 121 03/06/2020   CHOLHDL 3.3 03/06/2020    Fall screening Fall Risk  11/26/2020 05/29/2020 03/27/2020 11/29/2019 03/27/2019  Falls  in the past year? 0 0 0 0 0  Number falls in past yr: 0 - - - 0  Injury with Fall? 0 - - - 0  Risk for fall due to : No Fall Risks - No Fall Risks - -  Follow up Falls evaluation completed Falls evaluation completed Falls evaluation completed Falls evaluation completed -   Lighting in home:adequate Loose rugs/carpets/pets: dog at home. Stairs:1 flight with handrail.  Grab bars in bathroom: yes in shower.  Timed up and go:6 seconds, normal gait, no instability.   Depression Screening: Depression screen [facility] 2/9 11/26/2020 05/29/2020 03/27/2020 11/29/2019 02/22/2019  Decreased Interest 0 0 0 0 0  Down, Depressed, Hopeless 0 0 0 0 0  PHQ - 2 Score 0 0 0 0 0    Cancer Screening: Colon - as above - colonoscopy planned early next year.  Lab Results  Component Value Date   PSA 0.25 Test Methodology: [employer] PSA 05/15/2008  The natural history of prostate cancer and ongoing controversy regarding screening and potential treatment outcomes of prostate cancer has been discussed with the patient. The meaning of a false positive PSA and a false negative PSA has been discussed. He indicates understanding of the limitations of this screening test and wishes NOT to proceed with screening PSA testing. No FH prostate CA.    Immunization History  Administered Date(s) Administered   DTP 09/01/2009   Fluad Quad(high Dose 65+) 10/23/2018   H1N1 06/06/2008   Hepatitis A 07/23/2008,  09/01/2009   Hepatitis B 07/23/2008, 09/01/2009, 10/06/2010   Influenza Split 10/06/2011, 10/02/2012, 10/02/2013   Influenza Whole 11/14/2008, 09/01/2009, 10/06/2010   Influenza, High Dose Seasonal PF 10/15/2017   Influenza,inj,Quad PF,6+ Mos 11/03/2015, 12/16/2016   Influenza-Unspecified 12/19/2014, 11/17/2019   Moderna Sars-Covid-2 Vaccination 03/16/2019, 04/13/2019, 06/17/2019, 05/05/2020, 10/29/2020   PPD Test 06/06/2008   Pneumococcal Conjugate-13 10/23/2018   Pneumococcal Polysaccharide-23 06/06/2008, 11/29/2019   Flu vaccine and covid booster 9/28 at [facility].  Shingles vaccine - has not  had - recommended at his pharmacy,   Functional Status Survey: Is the patient deaf or have difficulty hearing?: No Does the patient have difficulty seeing, even when wearing glasses/contacts?: No Does the patient have difficulty concentrating, remembering, or making decisions?: No Does the patient have difficulty walking or climbing stairs?: No Does the patient have difficulty dressing or bathing?: No Does the patient have difficulty doing errands alone such as visiting a doctor's office or shopping?: No  Memory Screen: 6CIT Screen 11/26/2020  What Year? 0 points  What month? 0 points  What time? 0 points  Count back from 20 0 points  Months in reverse 0 points  Repeat phrase 0 points  Total Score 0    Alcohol Screening: [place] Visit from 11/26/2020 in [place]  AUDIT-C Score 0       Tobacco:  Smoker - 1 pack per day. Not ready to quit. Chest CT earlier this year.   Vision Screening   Right eye Left eye Both eyes  Without correction     With correction 20/30-1 20/25 20/20-1   Optho/optometry: last visit 1 year ago. Wears contact lens  Dental: Every 6 months usually.   Exercise: Walking dog - daily, 30-40 min.   Advanced Directives:  Paperwork provided.   History Patient Active Problem List   Diagnosis Date Noted   Cancer of right colon ([place]) 12/23/2019   Acute upper GI bleed 06/13/2019   Cecal polyp 05/26/2019   History of colonic polyps 05/26/2019   Abnormal CT scan, colon 05/26/2019   Syphilis 04/19/2018   Secondary polycythemia 05/26/2016   Pruritic rash 04/21/2015   Erectile dysfunction 03/02/2012   Allergic conjunctivitis 03/02/2012   HTN (hypertension) 10/06/2010   Smoking 10/06/2010   BPH (benign prostatic hyperplasia) 10/06/2010   COLONIC POLYPS 10/17/2008   CYSTS OF ORAL SOFT TISSUES 10/17/2008    DIVERTICULITIS OF COLON 10/17/2008   Human immunodeficiency virus (HIV) disease ([place]) 06/06/2008   THRUSH 06/06/2008   Pancytopenia ([place]) 06/06/2008   LUNG NODULE 06/06/2008   RENAL CALCULUS 06/06/2008   Past Medical History:  Diagnosis Date   BPH (benign prostatic hyperplasia)    Erectile dysfunction    History of kidney stones    passed stones   HIV infection ([place])    Hypertension    Pruritic rash 04/21/2015   Secondary polycythemia 05/26/2016   Syphilis 04/19/2018   Past Surgical History:  Procedure Laterality Date   BIOPSY  02/11/2020   Procedure: BIOPSY;  Surgeon: Irving Copas., MD;  Location: [place];  Service: Gastroenterology;;   COLONOSCOPY     COLONOSCOPY WITH PROPOFOL N/A 06/13/2019   Procedure: COLONOSCOPY WITH PROPOFOL;  Surgeon: Irving Copas., MD;  Location: [facility] ENDOSCOPY;  Service: Gastroenterology;  Laterality: N/A;   COLONOSCOPY WITH PROPOFOL N/A 02/11/2020   Procedure: COLONOSCOPY WITH PROPOFOL;  Surgeon: Rush Landmark Telford Nab., MD;  Location: [place];  Service: Gastroenterology;  Laterality: N/A;   ENDOSCOPIC MUCOSAL RESECTION N/A 06/13/2019   Procedure:  ENDOSCOPIC MUCOSAL RESECTION;  Surgeon: Rush Landmark, Telford Nab., MD;  Location: [place];  Service: Gastroenterology;  Laterality: N/A;   HEMOSTASIS CLIP PLACEMENT  06/13/2019   Procedure: HEMOSTASIS CLIP PLACEMENT;  Surgeon: Irving Copas., MD;  Location: [place];  Service: Gastroenterology;;   HEMOSTASIS CONTROL  06/13/2019   Procedure: HEMOSTASIS CONTROL;  Surgeon: Irving Copas., MD;  Location: [place];  Service: Gastroenterology;;   IR ANGIOGRAM SELECTIVE EACH ADDITIONAL VESSEL  06/13/2019   IR ANGIOGRAM VISCERAL SELECTIVE  06/13/2019   IR EMBO ART  VEN HEMORR LYMPH EXTRAV  INC GUIDE ROADMAPPING  06/13/2019   IR US GUIDE VASC ACCESS RIGHT  06/13/2019   PARTIAL COLECTOMY     SCLEROTHERAPY  06/13/2019   Procedure: SCLEROTHERAPY;  Surgeon: Mansouraty, Telford Nab., MD;  Location: [place];  Service: Gastroenterology;;   SUBMUCOSAL LIFTING INJECTION  06/13/2019   Procedure: SUBMUCOSAL LIFTING INJECTION;  Surgeon: Irving Copas., MD;  Location: [place];  Service: Gastroenterology;;   No Known Allergies Prior to Admission medications   Medication Sig Start Date End Date Taking? Authorizing Provider  amLODipine (NORVASC) 10 MG tablet Take 1 tablet (10 mg total) by mouth daily. 05/29/20  Yes Wendie Agreste, MD  Dolutegravir-lamiVUDine (DOVATO) 50-300 MG TABS Take 1 tablet by mouth daily. 11/07/20  Yes Tommy Medal, Lavell Islam, MD   Social History   Socioeconomic History   Marital status: Single    Spouse name: Not on file   Number of children: 0   Years of education: Not on file   Highest education level: Not on file  Occupational History   Occupation: retired  Tobacco Use   Smoking status: Every Day    Packs/day: 1.00    Years: 44.00    Pack years: 44.00    Types: Cigarettes    Start date: 08/01/2016   Smokeless tobacco: Never  Vaping Use   Vaping Use: Never used  Substance and Sexual Activity   Alcohol use: Yes    Comment: social rare   Drug use: No   Sexual activity: Yes    Comment: declined condoms 03/27/20  Other Topics Concern   Not on file  Social History Narrative   Not on file   Social Determinants of Health   Financial Resource Strain: Not on file  Food Insecurity: Not on file  Transportation Needs: Not on file  Physical Activity: Not on file  Stress: Not on file  Social Connections: Not on file  Intimate Partner Violence: Not on file    Review of Systems 13 point review of systems per patient health survey noted.  Negative other than as indicated above or in HPI.    Objective:   Vitals:   11/26/20 0807  BP: 126/70  Pulse: 68  Resp: 16  Temp: 97.9 F (36.6 C)  TempSrc: Temporal  SpO2: 96%  Weight: 173 lb 6.4 oz (78.7 kg)  Height: 5\' 10"  (1.778 m)     Physical Exam Vitals reviewed.   Constitutional:      Appearance: He is well-developed.  HENT:     Head: Normocephalic and atraumatic.     Right Ear: External ear normal.     Left Ear: External ear normal.  Eyes:     Conjunctiva/sclera: Conjunctivae normal.     Pupils: Pupils are equal, round, and reactive to light.  Neck:     Thyroid: No thyromegaly.  Cardiovascular:     Rate and Rhythm: Normal rate and regular rhythm.  Heart sounds: Normal heart sounds.  Pulmonary:     Effort: Pulmonary effort is normal. No respiratory distress.     Breath sounds: Normal breath sounds. No wheezing.  Abdominal:     General: There is no distension.     Palpations: Abdomen is soft.     Tenderness: There is no abdominal tenderness.  Musculoskeletal:        General: No tenderness. Normal range of motion.     Cervical back: Normal range of motion and neck supple.  Lymphadenopathy:     Cervical: No cervical adenopathy.  Skin:    General: Skin is warm and dry.  Neurological:     Mental Status: He is alert and oriented to person, place, and time.     Deep Tendon Reflexes: Reflexes are normal and symmetric.  Psychiatric:        Behavior: Behavior normal.    Assessment & Plan:  [name] is a 70 y.o. male . Wellness examination - Plan: CBC with Differential/Platelet, Comprehensive metabolic panel, Lipid panel  - - anticipatory guidance as below in AVS, screening labs if needed. Health maintenance items as above in HPI discussed/recommended as applicable.  - no concerning responses on depression, fall, or functional status screening. Any positive responses noted as above. Advanced directives discussed as in [facility].   Atherosclerosis of aorta (HCC) - Plan: Lipid panel Screening for hyperlipidemia - Plan: Lipid panel  -With calcification of valves as above.  Check lipid panel, prior testing overall normal.  Recommended cardiology eval as may need echo to evaluate valves further, patient declined.  Essential hypertension - Plan:  Comprehensive metabolic panel, amLODipine (NORVASC) 10 MG tablet  -  Stable, tolerating current regimen. Medications refilled. Labs pending as above.   Screening for deficiency anemia - Plan: CBC with Differential/Platelet  Screening for diabetes mellitus  Elevated hemoglobin ([place]) - Plan: CBC with Differential/Platelet  -Increase water intake discussed, recheck CBC.  Consider lab only visit for recheck CBC with increased hydration versus follow-up with his hematologist/oncologist next month.   Meds ordered this encounter  Medications   amLODipine (NORVASC) 10 MG tablet    Sig: Take 1 tablet (10 mg total) by mouth daily.    Dispense:  90 tablet    Refill:  2   Patient Instructions  If blood counts are still elevated, can try increased water intake and lab only visit. If still elevated then, would recommend discussing with Dr. Burr Medico.   Let me know if you would like to meet with cardiology regarding the calcifications seen on your heart valves.  I will check cholesterol levels again today as plaque/atherosclerosis was seen on previous imaging of the aorta.  Shingles vaccine can be given at your pharmacy.   Continue same blood pressure medication for now.  Let me know if you are ready to quit smoking.  I am happy to help.  Thanks for coming in today.  Preventive Care 48 Years and Older, Male Preventive care refers to lifestyle choices and visits with your health care provider that can promote health and wellness. This includes: A yearly physical exam. This is also called an annual wellness visit. Regular dental and eye exams. Immunizations. Screening for certain conditions. Healthy lifestyle choices, such as: Eating a healthy diet. Getting regular exercise. Not using drugs or products that contain nicotine and tobacco. Limiting alcohol use. What can I expect for my preventive care visit? Physical exam Your health care provider will check your: Height and weight. These may be  used  to calculate your BMI (body mass index). BMI is a measurement that tells if you are at a healthy weight. Heart rate and blood pressure. Body temperature. Skin for abnormal spots. Counseling Your health care provider may ask you questions about your: Past medical problems. Family's medical history. Alcohol, tobacco, and drug use. Emotional well-being. Home life and relationship well-being. Sexual activity. Diet, exercise, and sleep habits. History of falls. Memory and ability to understand (cognition). Work and work Statistician. Access to firearms. What immunizations do I need? Vaccines are usually given at various ages, according to a schedule. Your health care provider will recommend vaccines for you based on your age, medical history, and lifestyle or other factors, such as travel or where you work. What tests do I need? Blood tests Lipid and cholesterol levels. These may be checked every 5 years, or more often depending on your overall health. Hepatitis C test. Hepatitis B test. Screening Lung cancer screening. You may have this screening every year starting at age 9 if you have a 30-pack-year history of smoking and currently smoke or have quit within the past 15 years. Colorectal cancer screening. All adults should have this screening starting at age 70 and continuing until age 47. Your health care provider may recommend screening at age 28 if you are at increased risk. You will have tests every 1-10 years, depending on your results and the type of screening test. Prostate cancer screening. Recommendations will vary depending on your family history and other risks. Genital exam to check for testicular cancer or hernias. Diabetes screening. This is done by checking your blood sugar (glucose) after you have not eaten for a while (fasting). You may have this done every 1-3 years. Abdominal aortic aneurysm (AAA) screening. You may need this if you are a current or former  smoker. STD (sexually transmitted disease) testing, if you are at risk. Follow these instructions at home: Eating and drinking  Eat a diet that includes fresh fruits and vegetables, whole grains, lean protein, and low-fat dairy products. Limit your intake of foods with high amounts of sugar, saturated fats, and salt. Take vitamin and mineral supplements as recommended by your health care provider. Do not drink alcohol if your health care provider tells you not to drink. If you drink alcohol: Limit how much you have to 0-2 drinks a day. Be aware of how much alcohol is in your drink. In the [place] one drink equals one 12 oz bottle of beer (355 mL), one 5 oz glass of wine (148 mL), or one 1 oz glass of hard liquor (44 mL). Lifestyle Take daily care of your teeth and gums. Brush your teeth every morning and night with fluoride toothpaste. Floss one time each day. Stay active. Exercise for at least 30 minutes 5 or more days each week. Do not use any products that contain nicotine or tobacco, such as cigarettes, e-cigarettes, and chewing tobacco. If you need help quitting, ask your health care provider. Do not use drugs. If you are sexually active, practice safe sex. Use a condom or other form of protection to prevent STIs (sexually transmitted infections). Talk with your health care provider about taking a low-dose aspirin or statin. Find healthy ways to cope with stress, such as: Meditation, yoga, or listening to music. Journaling. Talking to a trusted person. Spending time with friends and family. Safety Always wear your seat belt while driving or riding in a vehicle. Do not drive: If you have been drinking alcohol. Do  not ride with someone who has been drinking. When you are tired or distracted. While texting. Wear a helmet and other protective equipment during sports activities. If you have firearms in your house, make sure you follow all gun safety procedures. What's next? Visit your  health care provider once a year for an annual wellness visit. Ask your health care provider how often you should have your eyes and teeth checked. Stay up to date on all vaccines. This information is not intended to replace advice given to you by your health care provider. Make sure you discuss any questions you have with your health care provider. Document Revised: 03/28/2020 Document Reviewed: 01/12/2018 Elsevier Patient Education  2022 [place],   Merri Ray, MD [place], [place] [facility] 11/26/20 9:06 AM

## 2020-11-26 NOTE — Patient Instructions (Addendum)
If blood counts are still elevated, can try increased water intake and lab only visit. If still elevated then, would recommend discussing with Dr. Burr Medico.   Let me know if you would like to meet with cardiology regarding the calcifications seen on your heart valves.  I will check cholesterol levels again today as plaque/atherosclerosis was seen on previous imaging of the aorta.  Shingles vaccine can be given at your pharmacy.   Continue same blood pressure medication for now.  Let me know if you are ready to quit smoking.  I am happy to help.  Thanks for coming in today.  Preventive Care 69 Years and Older, Male Preventive care refers to lifestyle choices and visits with your health care provider that can promote health and wellness. This includes: A yearly physical exam. This is also called an annual wellness visit. Regular dental and eye exams. Immunizations. Screening for certain conditions. Healthy lifestyle choices, such as: Eating a healthy diet. Getting regular exercise. Not using drugs or products that contain nicotine and tobacco. Limiting alcohol use. What can I expect for my preventive care visit? Physical exam Your health care provider will check your: Height and weight. These may be used to calculate your BMI (body mass index). BMI is a measurement that tells if you are at a healthy weight. Heart rate and blood pressure. Body temperature. Skin for abnormal spots. Counseling Your health care provider may ask you questions about your: Past medical problems. Family's medical history. Alcohol, tobacco, and drug use. Emotional well-being. Home life and relationship well-being. Sexual activity. Diet, exercise, and sleep habits. History of falls. Memory and ability to understand (cognition). Work and work Statistician. Access to firearms. What immunizations do I need? Vaccines are usually given at various ages, according to a schedule. Your health care provider will  recommend vaccines for you based on your age, medical history, and lifestyle or other factors, such as travel or where you work. What tests do I need? Blood tests Lipid and cholesterol levels. These may be checked every 5 years, or more often depending on your overall health. Hepatitis C test. Hepatitis B test. Screening Lung cancer screening. You may have this screening every year starting at age 69 if you have a 30-pack-year history of smoking and currently smoke or have quit within the past 15 years. Colorectal cancer screening. All adults should have this screening starting at age 24 and continuing until age 33. Your health care provider may recommend screening at age 53 if you are at increased risk. You will have tests every 1-10 years, depending on your results and the type of screening test. Prostate cancer screening. Recommendations will vary depending on your family history and other risks. Genital exam to check for testicular cancer or hernias. Diabetes screening. This is done by checking your blood sugar (glucose) after you have not eaten for a while (fasting). You may have this done every 1-3 years. Abdominal aortic aneurysm (AAA) screening. You may need this if you are a current or former smoker. STD (sexually transmitted disease) testing, if you are at risk. Follow these instructions at home: Eating and drinking  Eat a diet that includes fresh fruits and vegetables, whole grains, lean protein, and low-fat dairy products. Limit your intake of foods with high amounts of sugar, saturated fats, and salt. Take vitamin and mineral supplements as recommended by your health care provider. Do not drink alcohol if your health care provider tells you not to drink. If you drink alcohol: Limit how  much you have to 0-2 drinks a day. Be aware of how much alcohol is in your drink. In the [place] one drink equals one 12 oz bottle of beer (355 mL), one 5 oz glass of wine (148 mL), or one 1 oz  glass of hard liquor (44 mL). Lifestyle Take daily care of your teeth and gums. Brush your teeth every morning and night with fluoride toothpaste. Floss one time each day. Stay active. Exercise for at least 30 minutes 5 or more days each week. Do not use any products that contain nicotine or tobacco, such as cigarettes, e-cigarettes, and chewing tobacco. If you need help quitting, ask your health care provider. Do not use drugs. If you are sexually active, practice safe sex. Use a condom or other form of protection to prevent STIs (sexually transmitted infections). Talk with your health care provider about taking a low-dose aspirin or statin. Find healthy ways to cope with stress, such as: Meditation, yoga, or listening to music. Journaling. Talking to a trusted person. Spending time with friends and family. Safety Always wear your seat belt while driving or riding in a vehicle. Do not drive: If you have been drinking alcohol. Do not ride with someone who has been drinking. When you are tired or distracted. While texting. Wear a helmet and other protective equipment during sports activities. If you have firearms in your house, make sure you follow all gun safety procedures. What's next? Visit your health care provider once a year for an annual wellness visit. Ask your health care provider how often you should have your eyes and teeth checked. Stay up to date on all vaccines. This information is not intended to replace advice given to you by your health care provider. Make sure you discuss any questions you have with your health care provider. Document Revised: 03/28/2020 Document Reviewed: 01/12/2018 Elsevier Patient Education  2022 [employer].

## 2020-12-15 ENCOUNTER — Other Ambulatory Visit: Payer: Medicare Other

## 2020-12-15 ENCOUNTER — Ambulatory Visit: Payer: Medicare Other | Admitting: Hematology

## 2021-01-12 ENCOUNTER — Inpatient Hospital Stay: Payer: Medicare Other | Attending: Hematology

## 2021-01-12 ENCOUNTER — Other Ambulatory Visit: Payer: Self-pay

## 2021-01-12 DIAGNOSIS — Z21 Asymptomatic human immunodeficiency virus [HIV] infection status: Secondary | ICD-10-CM | POA: Insufficient documentation

## 2021-01-12 DIAGNOSIS — Z85038 Personal history of other malignant neoplasm of large intestine: Secondary | ICD-10-CM | POA: Diagnosis not present

## 2021-01-12 DIAGNOSIS — F17210 Nicotine dependence, cigarettes, uncomplicated: Secondary | ICD-10-CM | POA: Diagnosis not present

## 2021-01-12 DIAGNOSIS — C180 Malignant neoplasm of cecum: Secondary | ICD-10-CM

## 2021-01-12 LAB — CBC WITH DIFFERENTIAL (CANCER CENTER ONLY)
Abs Immature Granulocytes: 0.03 10*3/uL (ref 0.00–0.07)
Basophils Absolute: 0 10*3/uL (ref 0.0–0.1)
Basophils Relative: 0 %
Eosinophils Absolute: 0.1 10*3/uL (ref 0.0–0.5)
Eosinophils Relative: 1 %
HCT: 49.4 % (ref 39.0–52.0)
Hemoglobin: 16.8 g/dL (ref 13.0–17.0)
Immature Granulocytes: 1 %
Lymphocytes Relative: 27 %
Lymphs Abs: 1.7 10*3/uL (ref 0.7–4.0)
MCH: 32.6 pg (ref 26.0–34.0)
MCHC: 34 g/dL (ref 30.0–36.0)
MCV: 95.9 fL (ref 80.0–100.0)
Monocytes Absolute: 0.5 10*3/uL (ref 0.1–1.0)
Monocytes Relative: 8 %
Neutro Abs: 4.1 10*3/uL (ref 1.7–7.7)
Neutrophils Relative %: 63 %
Platelet Count: 204 10*3/uL (ref 150–400)
RBC: 5.15 MIL/uL (ref 4.22–5.81)
RDW: 12.9 % (ref 11.5–15.5)
WBC Count: 6.5 10*3/uL (ref 4.0–10.5)
nRBC: 0 % (ref 0.0–0.2)

## 2021-01-12 LAB — CMP (CANCER CENTER ONLY)
ALT: 17 U/L (ref 0–44)
AST: 18 U/L (ref 15–41)
Albumin: 4 g/dL (ref 3.5–5.0)
Alkaline Phosphatase: 73 U/L (ref 38–126)
Anion gap: 9 (ref 5–15)
BUN: 12 mg/dL (ref 8–23)
CO2: 24 mmol/L (ref 22–32)
Calcium: 9 mg/dL (ref 8.9–10.3)
Chloride: 107 mmol/L (ref 98–111)
Creatinine: 1.21 mg/dL (ref 0.61–1.24)
GFR, Estimated: >60 mL/min (ref >60)
Glucose, Bld: 90 mg/dL (ref 70–99)
Potassium: 4.4 mmol/L (ref 3.5–5.1)
Sodium: 140 mmol/L (ref 135–145)
Total Bilirubin: 0.6 mg/dL (ref 0.3–1.2)
Total Protein: 7.4 g/dL (ref 6.5–8.1)

## 2021-01-12 LAB — CEA (IN HOUSE-CHCC): CEA (CHCC-In House): 4.06 ng/mL (ref 0.00–5.00)

## 2021-01-14 ENCOUNTER — Telehealth: Payer: Self-pay | Admitting: Hematology

## 2021-01-14 NOTE — Telephone Encounter (Signed)
Scheduled per sch msg. Called and left msg  

## 2021-02-06 ENCOUNTER — Inpatient Hospital Stay: Payer: Medicare Other | Attending: Hematology | Admitting: Hematology

## 2021-02-06 ENCOUNTER — Other Ambulatory Visit: Payer: Self-pay

## 2021-02-06 DIAGNOSIS — C182 Malignant neoplasm of ascending colon: Secondary | ICD-10-CM | POA: Diagnosis not present

## 2021-02-06 NOTE — Progress Notes (Signed)
[place]   [phone] [phone]   Clinic Follow up Note   Patient Care Team: Wendie Agreste, MD as PCP - General (Family Medicine) Tommy Medal, Lavell Islam, MD as PCP - Infectious Diseases (Infectious Diseases) Jonnie Finner, RN (Inactive) as Oncology Nurse Navigator Truitt Merle, MD as Consulting Physician (Hematology) Alla Feeling, NP as Nurse Practitioner (Nurse Practitioner) Mansouraty, Telford Nab., MD as Consulting Physician (Gastroenterology)  Date of Service:  02/06/2021  I connected with [name] on 02/06/2021 at  9:40 AM EST by telephone visit and verified that I am speaking with the correct person using two identifiers.  I discussed the limitations, risks, security and privacy concerns of performing an evaluation and management service by telephone and the availability of in person appointments. I also discussed with the patient that there may be a patient responsible charge related to this service. The patient expressed understanding and agreed to proceed.   Other persons participating in the visit and their role in the encounter:  none  Patient's location:  home Provider's location:  my office  CHIEF COMPLAINT: f/u of colon cancer  CURRENT THERAPY:  Observation  ASSESSMENT & PLAN:  [name] is a 71 y.o. male with   1. Adenocarcinoma of the cecum, poorly differentiated, pT1NXM0, stage I, MMR normal  -He has a large cecal polyp on first screening colonoscopy in 03/2019 which was negative for malignancy, repeat colonoscopy 3 months later in 06/2019 showed multiple foci of poorly differentiated adenocarcinoma extending into superficial submucosa to a depth of 0.3, when it was resected by polypectomy. CT was negative for metastatic disease.  -Pt declined surgery with hemicolectomy due to lack of support after surgery. He understands he will need close surveillance going forward.  -His 02/21/20 Surveillance Colonoscopy with Dr.  Rush Landmark was benign and 02/21/20 CT CAP showed No definitive findings to suggest metastatic disease in the chest, abdomen or pelvis and Persistent soft tissue prominence in the base of the cecum.  -he is due for surveillance colonoscopy. He reports he has issues with transportation and notes it is difficult to find someone to go with and wait for him. I reviewed the importance of colonoscopy as it provides more information than CT scan for local issues. I discussed the option of CT colonography, as this would allow him to drive himself/not need someone with him. He feels this would be easier for him to complete. I will reach out to Dr. Rush Landmark to ensure he agrees with this plan. -He is clinically doing well with no major concerns. His recent labs from 01/12/21 show CBC, CMP, and CEA all WNL.  I reviewed with him  -F/u in 6 months.    2. HIV -diagnosed 1984, controlled per patient  -on [place] per Dr. Tommy Medal     PLAN: -I will reach out to Dr. Rush Landmark regarding his preference of  CT colonography over colonoscopy, and plan to schedule it in 6 months  -Lab and F/u in 6 months after CT    No problem-specific Assessment & Plan notes found for this encounter.   SUMMARY OF ONCOLOGIC HISTORY: Oncology History Overview Note  Cancer Staging Cancer of right colon [facility]) Staging form: Colon and Rectum, AJCC 8th Edition - Clinical stage from 06/13/2019: Stage I (cT1, cN0, cM0) - Signed by Truitt Merle, MD on 12/23/2019    Cancer of right colon [facility])  03/13/2019 Procedure   Colonoscopy by Dr Havery Moros IMPRESSION - The examined portion  of the ileum was normal. - Polypoid lesion in the cecum overlying the appendiceal orifice as outlined. Biopsied. - Diverticulosis in the sigmoid colon and in the cecum. - Internal hemorrhoids. - The examination was otherwise normal.     Diagnosis Surgical [P], colon, cecum polyp/polypoid lesion - SUPERFICIAL FRAGMENTS OF TUBULOVILLOUS ADENOMA WITH FOCAL  HIGH-GRADE DYSPLASIA - NEGATIVE FOR CARCINOMA   03/28/2019 Imaging   CT AP W contrast  IMPRESSION: 1. 3.5 cm cecal mass, corresponding to the colonoscopy abnormality. No pericolonic extension or adjacent adenopathy. 2. Hepatic morphology which is suspicious for mild cirrhosis. Recannulized paraumbilical vein, suggesting portal venous hypertension. 3.  Aortic Atherosclerosis (ICD10-I70.0). 4. Mild prostatomegaly.   06/13/2019 Cancer Staging   Staging form: Colon and Rectum, AJCC 8th Edition - Clinical stage from 06/13/2019: Stage I (cT1, cN0, cM0) - Signed by Truitt Merle, MD on 12/23/2019    06/13/2019 Procedure   Colonoscopy for polypectomy by Dr Rush Landmark  IMPRESSION - Hemorrhoids found on digital rectal exam. - The examined portion of the ileum was normal. - One 45-50 mm polyp in the cecum, removed with piecemeal mucosal resection. Resected and retrieved. Bleeding was noted during the procedure. Injected with Epinephrine as above. Clips (MR conditional) were placed. Persistent oozing noted. Hemostatic spray applied. - Diverticulosis in the recto-sigmoid colon, in the sigmoid colon, in the descending colon and in the ascending colon. - Non-bleeding non-thrombosed external and internal hemorrhoids   06/13/2019 Initial Biopsy   FINAL MICROSCOPIC DIAGNOSIS:   A. COLON, CECUM, POLYPECTOMY:  - Invasive adenocarcinoma, poorly differentiated, scattered small foci.  - Adenocarcinoma extends into superficial submucosa (depth of 0.3 cm).  - The surgical resection margins are negative for carcinoma.  - See oncology table below.    Mismatch Repair Protein (IHC)   SUMMARY INTERPRETATION: NORMAL    IHC EXPRESSION RESULTS   TEST           RESULT  MLH1:          Preserved nuclear expression  MSH2:          Preserved nuclear expression  MSH6:          Preserved nuclear expression  PMS2:          Preserved nuclear expression    06/19/2019 Imaging   CT Chest  IMPRESSION: 1. No  evidence of metastatic disease in the chest. 2. Finely irregular liver surface suggests cirrhosis. 3. Aortic Atherosclerosis (ICD10-I70.0) and Emphysema (ICD10-J43.9).       12/23/2019 Initial Diagnosis   Cancer of right colon ([place])   02/11/2020 Procedure   Colonoscopy by Dr. Rush Landmark  -Hemorrhoids found on digital rectal exam. - The examined portion of the ileum was normal. - Post mucosectomy scar in the cecum with areas of likely clip artifact noted in the cecal base though potential for residual adenoma is present. The cecal base was resected with a cold snare. - Diverticulosis in the recto-sigmoid colon and in the sigmoid colon. - Normal mucosa in the entire examined colon otherwise. - Non-bleeding non-thrombosed external and internal hemorrhoids   02/11/2020 Pathology Results   FINAL MICROSCOPIC DIAGNOSIS:  A. COLON, CECUM BASE, BIOPSY:  - Colonic mucosa with benign lymphoid aggregates.  - No dysplasia, adenomatous change or carcinoma.    02/21/2020 Imaging   CT CAP  IMPRESSION: 1. Persistent soft tissue prominence in the base of the cecum. The possibility of residual tumor should be considered, and correlation with repeat colonoscopy is suggested if clinically appropriate. 2. No definitive findings to suggest metastatic  disease in the chest, abdomen or pelvis. 3. Morphologic changes in the liver indicative of underlying cirrhosis. 4. Aortic atherosclerosis. 5. There are calcifications of the aortic valve and mitral valve. Echocardiographic correlation for evaluation of potential valvular dysfunction may be warranted if clinically indicated. 6. Additional incidental findings, as above      INTERVAL HISTORY:  [name] was contacted for a follow up of colon cancer. He identified himself by birth date. We last spoke by phone on 06/13/20.  He reports he is doing very well; he denies pain or stomach issues.    All other systems were reviewed with the patient and are  negative.  MEDICAL HISTORY:  Past Medical History:  Diagnosis Date   BPH (benign prostatic hyperplasia)    Colon cancer [facility])    Erectile dysfunction    History of kidney stones    passed stones   HIV infection ([place])    Hypertension    Pruritic rash 04/21/2015   Secondary polycythemia 05/26/2016   Syphilis 04/19/2018    SURGICAL HISTORY: Past Surgical History:  Procedure Laterality Date   BIOPSY  02/11/2020   Procedure: BIOPSY;  Surgeon: Irving Copas., MD;  Location: [facility] ENDOSCOPY;  Service: Gastroenterology;;   COLONOSCOPY     COLONOSCOPY WITH PROPOFOL N/A 06/13/2019   Procedure: COLONOSCOPY WITH PROPOFOL;  Surgeon: Irving Copas., MD;  Location: [place];  Service: Gastroenterology;  Laterality: N/A;   COLONOSCOPY WITH PROPOFOL N/A 02/11/2020   Procedure: COLONOSCOPY WITH PROPOFOL;  Surgeon: Rush Landmark Telford Nab., MD;  Location: [place];  Service: Gastroenterology;  Laterality: N/A;   ENDOSCOPIC MUCOSAL RESECTION N/A 06/13/2019   Procedure: ENDOSCOPIC MUCOSAL RESECTION;  Surgeon: Rush Landmark Telford Nab., MD;  Location: [place];  Service: Gastroenterology;  Laterality: N/A;   HEMOSTASIS CLIP PLACEMENT  06/13/2019   Procedure: HEMOSTASIS CLIP PLACEMENT;  Surgeon: Irving Copas., MD;  Location: [place];  Service: Gastroenterology;;   HEMOSTASIS CONTROL  06/13/2019   Procedure: HEMOSTASIS CONTROL;  Surgeon: Irving Copas., MD;  Location: [place];  Service: Gastroenterology;;   IR ANGIOGRAM SELECTIVE EACH ADDITIONAL VESSEL  06/13/2019   IR ANGIOGRAM VISCERAL SELECTIVE  06/13/2019   IR EMBO ART  VEN HEMORR LYMPH EXTRAV  INC GUIDE ROADMAPPING  06/13/2019   IR US GUIDE VASC ACCESS RIGHT  06/13/2019   PARTIAL COLECTOMY     SCLEROTHERAPY  06/13/2019   Procedure: SCLEROTHERAPY;  Surgeon: Mansouraty, Telford Nab., MD;  Location: [place];  Service: Gastroenterology;;   SUBMUCOSAL LIFTING INJECTION  06/13/2019   Procedure: SUBMUCOSAL  LIFTING INJECTION;  Surgeon: Irving Copas., MD;  Location: [place];  Service: Gastroenterology;;    I have reviewed the social history and family history with the patient and they are unchanged from previous note.  ALLERGIES:  has No Known Allergies.  MEDICATIONS:  Current Outpatient Medications  Medication Sig Dispense Refill   amLODipine (NORVASC) 10 MG tablet Take 1 tablet (10 mg total) by mouth daily. 90 tablet 2   Dolutegravir-lamiVUDine (DOVATO) 50-300 MG TABS Take 1 tablet by mouth daily. 30 tablet 11   No current facility-administered medications for this visit.    PHYSICAL EXAMINATION: ECOG PERFORMANCE STATUS: 0 - Asymptomatic  There were no vitals filed for this visit. Wt Readings from Last 3 Encounters:  11/26/20 173 lb 6.4 oz (78.7 kg)  11/07/20 178 lb (80.7 kg)  05/29/20 173 lb 12.8 oz (78.8 kg)     No vitals taken today, Exam not performed today  LABORATORY DATA:  I  have reviewed the data as listed CBC Latest Ref Rng & Units 01/12/2021 11/26/2020 11/07/2020  WBC 4.0 - 10.5 K/uL 6.5 4.7 6.0  Hemoglobin 13.0 - 17.0 g/dL 16.8 17.5(H) 18.4(H)  Hematocrit 39.0 - 52.0 % 49.4 52.3(H) 52.4(H)  Platelets 150 - 400 K/uL 204 194.0 229     CMP Latest Ref Rng & Units 01/12/2021 11/26/2020 11/07/2020  Glucose 70 - 99 mg/dL 90 84 92  BUN 8 - 23 mg/dL _0 Creatinine 0.61 - 1.24 mg/dL 1.21 1.20 1.11  Sodium 135 - 145 mmol/L 140 138 137  Potassium 3.5 - 5.1 mmol/L 4.4 4.9 4.7  Chloride 98 - 111 mmol/L 107 104 105  CO2 22 - 32 mmol/L _1 Calcium 8.9 - 10.3 mg/dL 9.0 9.5 9.5  Total Protein 6.5 - 8.1 g/dL 7.4 7.3 7.2  Total Bilirubin 0.3 - 1.2 mg/dL 0.6 0.7 0.7  Alkaline Phos 38 - 126 U/L 73 73 -  AST 15 - 41 U/L _2 ALT 0 - 44 U/L _3 RADIOGRAPHIC STUDIES: I have personally reviewed the radiological images as listed and agreed with the findings in the report. No results found.    No orders of the defined types were placed  in this encounter.  All questions were answered. The patient knows to call the clinic with any problems, questions or concerns. No barriers to learning was detected. The total time spent in the appointment was 22 minutes.     Truitt Merle, MD 02/06/2021   I, Wilburn Mylar, am acting as scribe for Truitt Merle, MD.   I have reviewed the above documentation for accuracy and completeness, and I agree with the above.

## 2021-02-09 ENCOUNTER — Telehealth: Payer: Self-pay | Admitting: Hematology

## 2021-02-09 NOTE — Telephone Encounter (Signed)
Left message with follow-up appointments per 1/6 los. °

## 2021-02-25 IMAGING — CT CT CHEST-ABD-PELV W/ CM
3 of 5 series · 14 of 36 positions shown, 16 images · IV contrast (APPLIED)
Comparison: Chest CT 06/19/2019. CT of the abdomen and pelvis
03/28/2019.

CLINICAL DATA: 69-year-old male with history of colorectal cancer
with rising CEA levels. Follow-up study.

EXAM:
CT CHEST, ABDOMEN, AND PELVIS WITH CONTRAST
TECHNIQUE: Multidetector CT imaging of the chest, abdomen and pelvis was
performed following the standard protocol during bolus
administration of intravenous contrast.
CONTRAST:  100mL OMNIPAQUE IOHEXOL 300 MG/ML  SOLN

[Series 2: cap with · axial · 0.80mm/px · z∈[-576,-56]mm · 9 of 130 slices shown, 11 images]
[im 13/130  mediastinal]
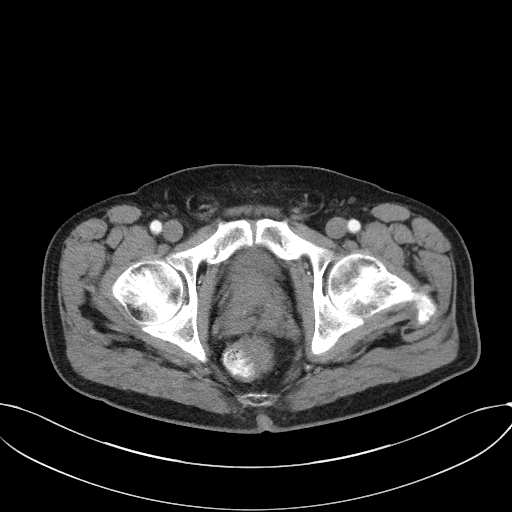
[im 13/130  bone]
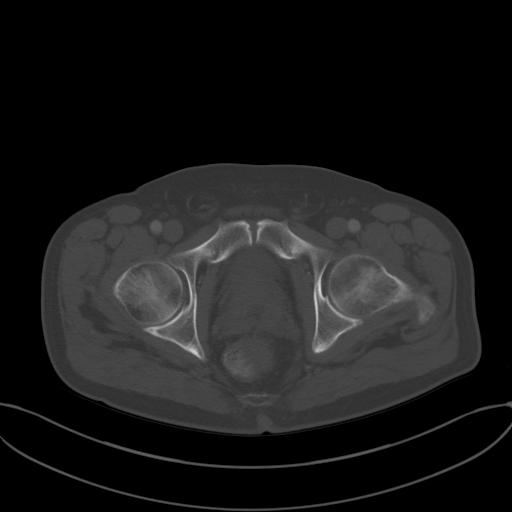
[im 26/130  mediastinal]
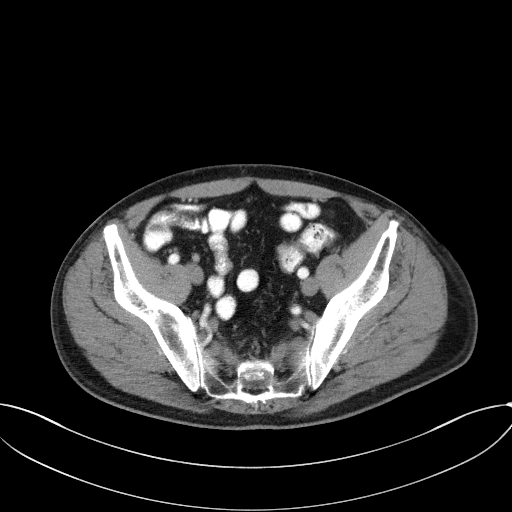
[im 39/130  mediastinal]
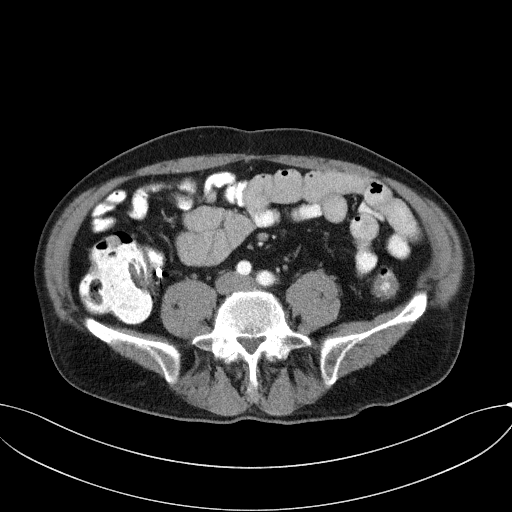
[im 52/130  mediastinal]
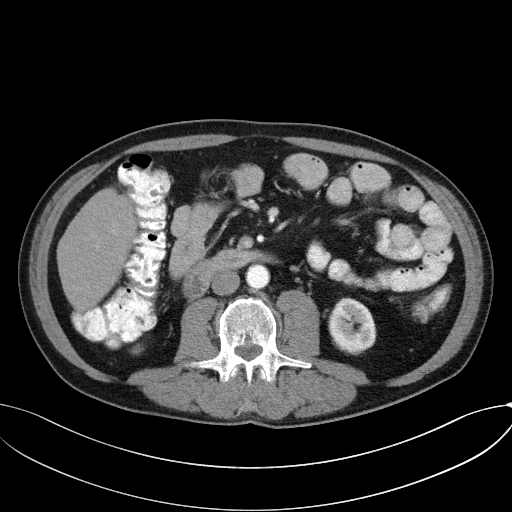
[im 65/130  mediastinal]
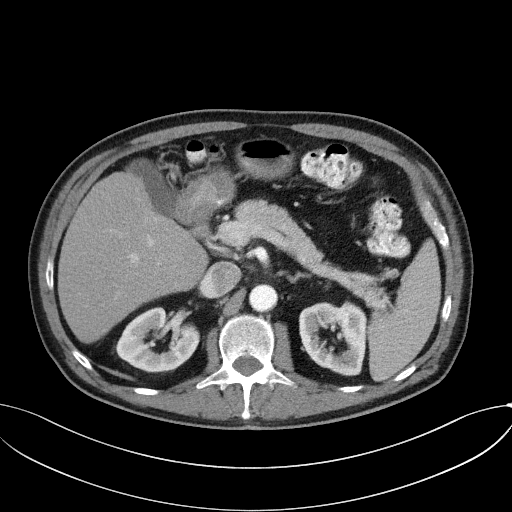
[im 78/130  mediastinal]
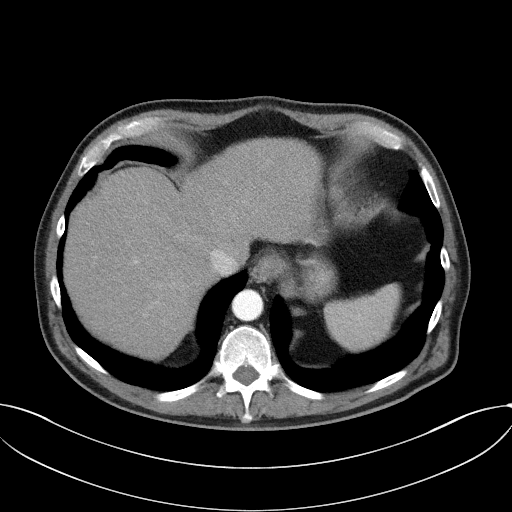
[im 91/130  mediastinal]
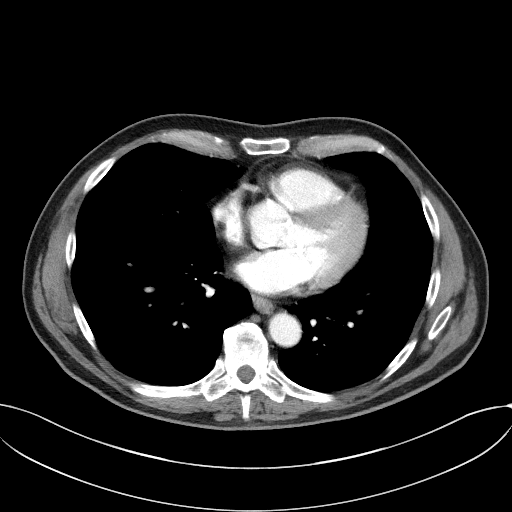
[im 104/130  mediastinal]
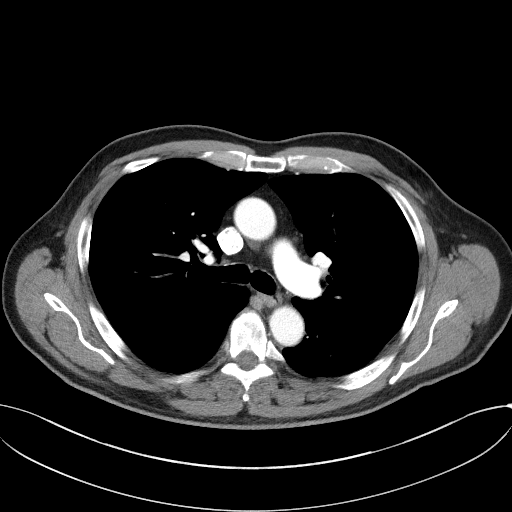
[im 117/130  mediastinal]
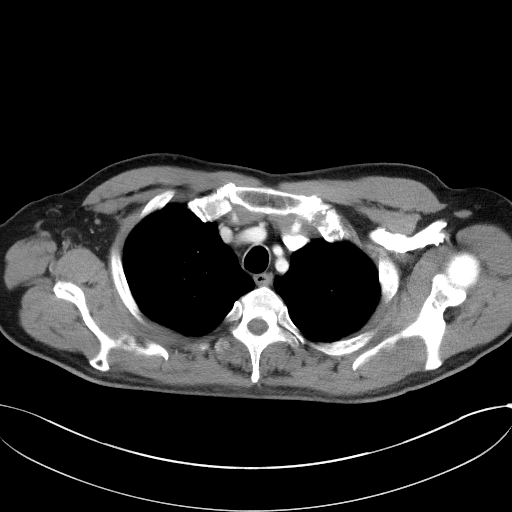
[im 117/130  bone]
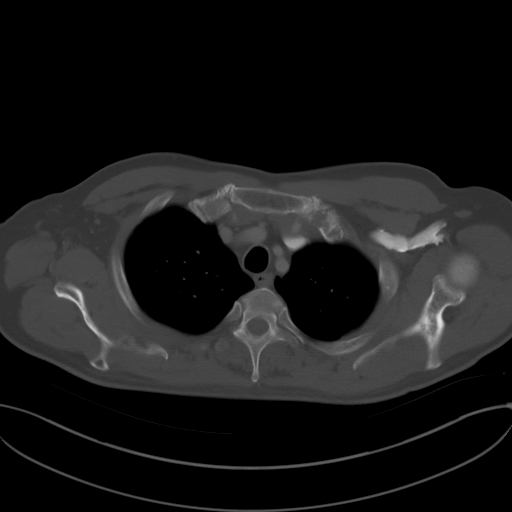

[Series 4: coronals · coronal · 0.84mm/px · 3 of 156 slices shown]
[im 32/156  mediastinal]
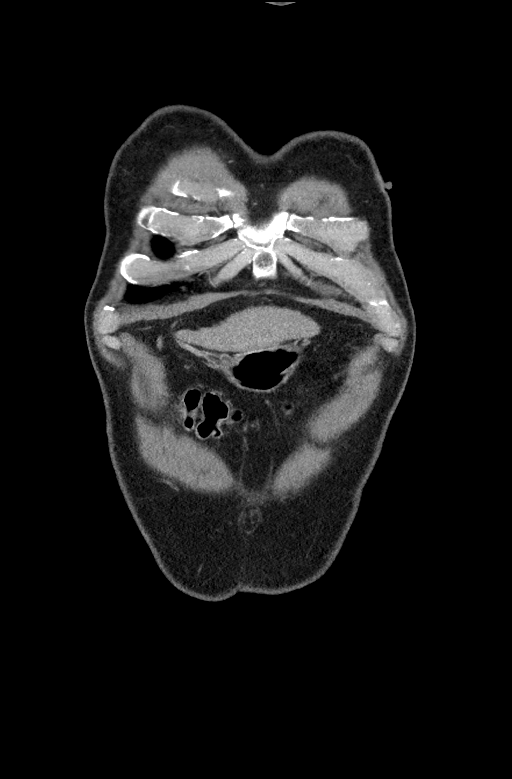
[im 63/156  mediastinal]
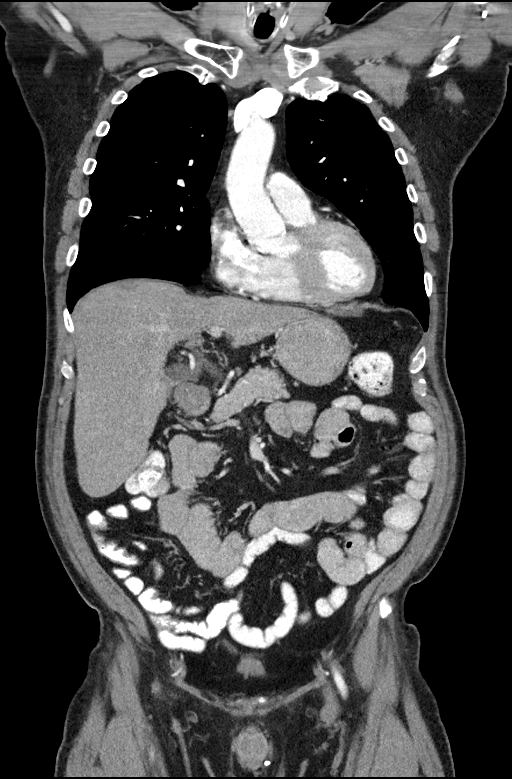
[im 94/156  mediastinal]
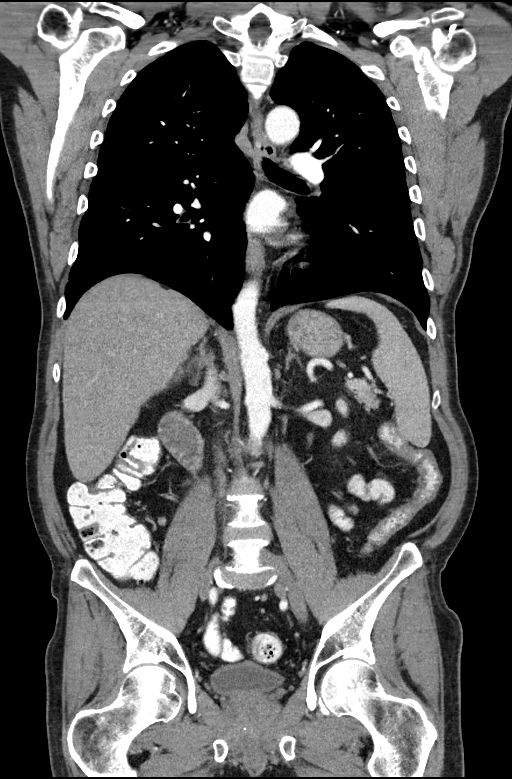

[Series 6: lung · axial · 0.80mm/px · z∈[-264,-214]mm · 2 of 150 slices shown]
[im 13/150  bone]
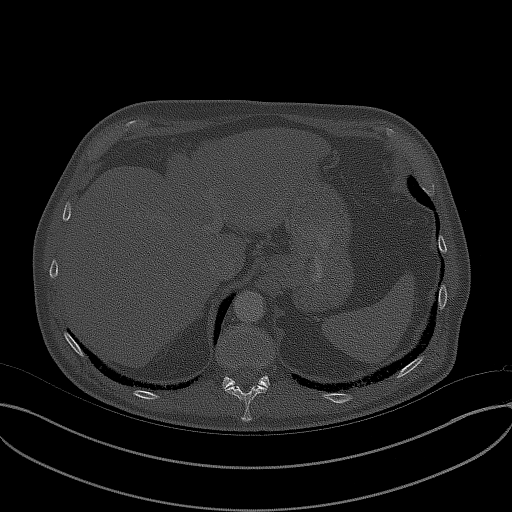
[im 38/150  bone]
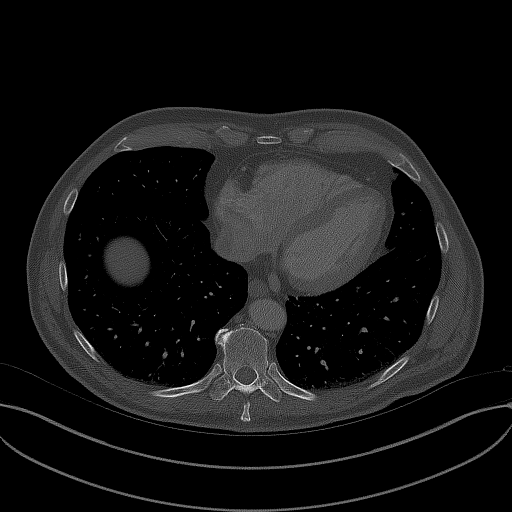

[14 of 36 positions shown; findings below may reference images not displayed]

FINDINGS: CT CHEST FINDINGS

Cardiovascular: Heart size is normal. There is no significant
pericardial fluid, thickening or pericardial calcification. Aortic
atherosclerosis. No definite coronary artery calcification.
Calcifications of the aortic valve and anterior leaflet of the
mitral valve.

Mediastinum/Nodes: No pathologically enlarged mediastinal or hilar
lymph nodes. Esophagus is unremarkable in appearance. No axillary
lymphadenopathy.

Lungs/Pleura: A few scattered tiny 2-4 mm subpleural nodules are
noted in the lungs, stable compared to prior examinations,
considered benign (presumably subpleural lymph nodes). No other
suspicious appearing pulmonary nodules or masses are noted. No acute
consolidative airspace disease. No pleural effusions.

Musculoskeletal: There are no aggressive appearing lytic or blastic
lesions noted in the visualized portions of the skeleton.

CT ABDOMEN PELVIS FINDINGS

Hepatobiliary: Liver has a slightly shrunken appearance and nodular
contour, indicative of underlying cirrhosis. No discrete cystic or
solid hepatic lesions are identified on today's examination. No
intra or extrahepatic biliary ductal dilatation. Gallbladder is
unremarkable in appearance.

Pancreas: No pancreatic mass. No pancreatic ductal dilatation. No
pancreatic or peripancreatic fluid collections or inflammatory
changes.

Spleen: Small calcified granulomas in the spleen. Otherwise,
unremarkable.

Adrenals/Urinary Tract: Tiny subcentimeter low-attenuation lesions
in both kidneys, too small to definitively characterize, but
statistically likely to represent tiny cysts. No
hydroureteronephrosis. Urinary bladder is normal in appearance.
Bilateral adrenal glands are normal in appearance.

Stomach/Bowel: The appearance of the stomach is normal. There is no
pathologic dilatation of small bowel or colon. Normal appendix. Soft
tissue prominence in the base of the cecum measuring approximately
3.0 x 2.0 x 2.1 cm (axial image 98 of series 2 and coronal image 81
of series 4). Surgical clips adjacent to the cecum.

Vascular/Lymphatic: Aortic atherosclerosis, without evidence of
aneurysm or dissection in the abdominal or pelvic vasculature. No
lymphadenopathy noted in the abdomen or pelvis.

Reproductive: Prostate gland and seminal vesicles are unremarkable
in appearance.

Other: No significant volume of ascites.  No pneumoperitoneum.

Musculoskeletal: There are no aggressive appearing lytic or blastic
lesions noted in the visualized portions of the skeleton.
IMPRESSION: 1. Persistent soft tissue prominence in the base of the cecum. The
possibility of residual tumor should be considered, and correlation
with repeat colonoscopy is suggested if clinically appropriate.
2. No definitive findings to suggest metastatic disease in the
chest, abdomen or pelvis.
3. Morphologic changes in the liver indicative of underlying
cirrhosis.
4. Aortic atherosclerosis.
5. There are calcifications of the aortic valve and mitral valve.
Echocardiographic correlation for evaluation of potential valvular
dysfunction may be warranted if clinically indicated.
6. Additional incidental findings, as above.

## 2021-03-16 ENCOUNTER — Telehealth: Payer: Self-pay

## 2021-03-16 NOTE — Telephone Encounter (Signed)
Patient called stating that his friend tested positive for Syphilis and he has now developed a rash. Requested appointment to be tested. Patient scheduled with Janene Madeira, NP on 2/14 at 1:45 pm.    Tomi Bamberger, CMA

## 2021-03-17 ENCOUNTER — Encounter: Payer: Self-pay | Admitting: Infectious Diseases

## 2021-03-17 ENCOUNTER — Other Ambulatory Visit: Payer: Self-pay

## 2021-03-17 ENCOUNTER — Ambulatory Visit (INDEPENDENT_AMBULATORY_CARE_PROVIDER_SITE_OTHER): Payer: Medicare Other | Admitting: Infectious Diseases

## 2021-03-17 VITALS — BP 150/82 | HR 76 | Temp 98.5°F | Wt 172.0 lb

## 2021-03-17 DIAGNOSIS — Z202 Contact with and (suspected) exposure to infections with a predominantly sexual mode of transmission: Secondary | ICD-10-CM

## 2021-03-17 DIAGNOSIS — A539 Syphilis, unspecified: Secondary | ICD-10-CM

## 2021-03-17 MED ORDER — PENICILLIN G BENZATHINE 1200000 UNIT/2ML IM SUSY
1.2000 10*6.[IU] | PREFILLED_SYRINGE | Freq: Once | INTRAMUSCULAR | Status: AC
  Administered 2021-03-17: 1.2 10*6.[IU] via INTRAMUSCULAR

## 2021-03-17 MED ORDER — PENICILLIN G BENZATHINE 1200000 UNIT/2ML IM SUSY
1.2000 10*6.[IU] | PREFILLED_SYRINGE | Freq: Once | INTRAMUSCULAR | Status: AC
Start: 2021-03-17 — End: 2021-03-17
  Administered 2021-03-17: 1.2 10*6.[IU] via INTRAMUSCULAR

## 2021-03-17 NOTE — Assessment & Plan Note (Signed)
Known sexual exposure - I don't think his rash is c/w syphilis and appears more dermatitis. Overall the rash is improving from what he describes.  Will collect RPR today and treat with 1 shot of bicillin 2.4 million units for exposure.

## 2021-03-17 NOTE — Progress Notes (Signed)
Subjective:     [name] is a 71 y.o. male here today for concern about syphilis exposure.   He called to report that a partner had tested positive for syphilis and [name] reports a rash that started between gluteal fold this weekend. At first it was quite red, inflamed and itchy; has since calmed down. No genital or oral ulcerations to his awareness. No rash on hands/feet or torso.   [name] has well controlled HIV disease followed by Dr. Tommy Medal and is on once daily Dovato for management. LOV in October 2022.    Review of Systems: Review of Systems  Constitutional:  Negative for chills and fever.  HENT:  Negative for sore throat.   Eyes:  Negative for visual disturbance.  Gastrointestinal:  Negative for abdominal pain, anal bleeding and rectal pain.  Genitourinary:  Negative for dysuria, genital sores, penile discharge, penile pain, scrotal swelling and testicular pain.  Musculoskeletal:  Negative for arthralgias and joint swelling.  Skin:  Positive for rash.  Neurological:  Negative for headaches.  Hematological:  Negative for adenopathy.   Sexual History:  Prefers male partners  Past Medical History:  Diagnosis Date   BPH (benign prostatic hyperplasia)    Colon cancer ([place])    Erectile dysfunction    History of kidney stones    passed stones   HIV infection ([place])    Hypertension    Pruritic rash 04/21/2015   Secondary polycythemia 05/26/2016   Syphilis 04/19/2018    Outpatient Medications Prior to Visit  Medication Sig Dispense Refill   amLODipine (NORVASC) 10 MG tablet Take 1 tablet (10 mg total) by mouth daily. 90 tablet 2   Dolutegravir-lamiVUDine (DOVATO) 50-300 MG TABS Take 1 tablet by mouth daily. 30 tablet 11   No facility-administered medications prior to visit.    No Known Allergies  Family History  Problem Relation Age of Onset   Heart failure Mother    Stroke Father    Colon cancer Neg Hx    Esophageal cancer Neg Hx    Stomach cancer  Neg Hx    Rectal cancer Neg Hx    Inflammatory bowel disease Neg Hx    Liver disease Neg Hx    Pancreatic cancer Neg Hx         Objective:  Objective  Vitals:   03/17/21 1341  BP: (!) 150/82  Pulse: 76  Temp: 98.5 F (36.9 C)   Body mass index is 24.68 kg/m.   Physical Exam  Physical Exam Constitutional:      Appearance: Normal appearance. He is not ill-appearing.  HENT:     Head: Normocephalic.     Mouth/Throat:     Mouth: Mucous membranes are moist.     Pharynx: Oropharynx is clear.  Eyes:     General: No scleral icterus. Pulmonary:     Effort: Pulmonary effort is normal.  Musculoskeletal:        General: Normal range of motion.     Cervical back: Normal range of motion.  Skin:    Coloration: Skin is not jaundiced or pale.          Comments: Flaking rash that appears slightly pink noted in patchy distribution along gluteal fold, right moreso than left. There are a few red appearing scabs that have healed over.  No peri-anal rash noted. No posterior scrotal lesions.   Neurological:     Mental Status: He is alert and oriented to person, place,  and time.  Psychiatric:        Mood and Affect: Mood normal.        Judgment: Judgment normal.         Assessment & Plan:    Problem List Items Addressed This Visit       Unprioritized   Syphilis    Known sexual exposure - I don't think his rash is c/w syphilis and appears more dermatitis. Overall the rash is improving from what he describes.  Will collect RPR today and treat with 1 shot of bicillin 2.4 million units for exposure.       Other Visit Diagnoses     Exposure to syphilis    -  Primary   Relevant Orders   RPR       Janene Madeira, MSN, NP-C [facility] Office: [phone] Pager: [phone]

## 2021-03-17 NOTE — Addendum Note (Signed)
Addended by: Adelfa Koh on: 03/17/2021 02:27 PM   Modules accepted: Orders

## 2021-03-19 LAB — RPR: RPR Ser Ql: REACTIVE — AB

## 2021-03-19 LAB — FLUORESCENT TREPONEMAL AB(FTA)-IGG-BLD: Fluorescent Treponemal ABS: REACTIVE — AB

## 2021-03-19 LAB — RPR TITER: RPR Titer: 1:4 {titer} — ABNORMAL HIGH

## 2021-03-30 ENCOUNTER — Encounter: Payer: Self-pay | Admitting: Gastroenterology

## 2021-05-27 ENCOUNTER — Encounter: Payer: Self-pay | Admitting: Family Medicine

## 2021-05-27 ENCOUNTER — Ambulatory Visit (INDEPENDENT_AMBULATORY_CARE_PROVIDER_SITE_OTHER): Payer: Medicare Other | Admitting: Family Medicine

## 2021-05-27 VITALS — BP 140/68 | HR 64 | Temp 98.2°F | Resp 16 | Ht 70.0 in | Wt 174.6 lb

## 2021-05-27 DIAGNOSIS — F17200 Nicotine dependence, unspecified, uncomplicated: Secondary | ICD-10-CM

## 2021-05-27 DIAGNOSIS — C189 Malignant neoplasm of colon, unspecified: Secondary | ICD-10-CM | POA: Diagnosis not present

## 2021-05-27 DIAGNOSIS — I10 Essential (primary) hypertension: Secondary | ICD-10-CM | POA: Diagnosis not present

## 2021-05-27 DIAGNOSIS — I700 Atherosclerosis of aorta: Secondary | ICD-10-CM

## 2021-05-27 MED ORDER — AMLODIPINE BESYLATE 10 MG PO TABS: 10.0000 mg | ORAL_TABLET | Freq: Every day | ORAL | 2 refills | Status: DC

## 2021-05-27 NOTE — Progress Notes (Signed)
? ?Subjective:  ?Patient ID: [name], male    DOB: 17-Apr-1950  Age: 71 y.o. MRN: [id number] ? ?CC:  ?Chief Complaint  ?Patient presents with  ? Hypertension  ?  Pt here for BP check, doing well no concerns requested we do not do blood work today   ? ? ?HPI ?[name] presents for  ? ?Hypertension: ?Amlodipine 10 mg daily.  ?Home readings: 130/70.  ?Occasional added salt, usually cooking at home. Frozen dinners.  ? ?BP Readings from Last 3 Encounters:  ?05/27/21 140/68  ?03/17/21 (!) 150/82  ?11/26/20 126/70  ? ?Lab Results  ?Component Value Date  ? CREATININE 1.21 01/12/2021  ? ?Lab Results  ?Component Value Date  ? WBC 6.5 01/12/2021  ? HGB 16.8 01/12/2021  ? HCT 49.4 01/12/2021  ? MCV 95.9 01/12/2021  ? PLT 204 01/12/2021  ? ?Nicotine addiction ?Smokes cigarettes, no plans to quit at this time. ? ?Aortic atherosclerosis ?Noted on CT chest January 2022.  Calcifications of aortic valve and mitral valve also noted, recommended cardiology referral previously, declined.  No known personal history of CAD, PAD.  He is not on statin. Discussed these results and ascvd score - declined statin.  ? ?Lab Results  ?Component Value Date  ? CHOL 157 11/26/2020  ? HDL 42.70 11/26/2020  ? LDLCALC 101 (H) 11/26/2020  ? TRIG 71.0 11/26/2020  ? CHOLHDL 4 11/26/2020  ?The 10-year ASCVD risk score (Arnett DK, et al., 2019) is: 26.3% ?  Values used to calculate the score: ?    Age: 58 years ?    Sex: Male ?    Is Non-Hispanic African American: No ?    Diabetic: No ?    Tobacco smoker: Yes ?    Systolic Blood Pressure: 222 mmHg ?    Is BP treated: Yes ?    HDL Cholesterol: 42.7 mg/dL ?    Total Cholesterol: 157 mg/dL ? ?HIV disease, followed by infectious disease Dr. Drucilla Blackwell.  Treated with Dovato.  ? ?Colon cancer ?Noted in May 2021.  Surveillance.  Oncology Dr.Feng, gastroenterology Dr. Rush Landmark. Colonoscopy in January 2022.  Hemorrhoids noted, post mucosectomy scar in the cecum, clip artifact versus residual adenoma.  Cecal base  resected with cold snare.  Pathology indicated colonic mucosa with benign lymphoid aggregates, no dysplasia, adenomatous change or carcinoma.  Diverticulosis in the rectosigmoid colon and sigmoid colon.  Normal mucosa otherwise.  CT CAP without findings to suggest metastatic disease in January 2022.  Due for surveillance colonoscopy, option of CT colonography discussed with oncology in January.  CBC, CMP and CEA all within normal limits in December 2022. ?It appears virtual colonoscopy was ordered but not yet completed. He does not plan to have virtual colonoscopy as may not show what is needed.  ?Still trying to have someone available for when he has colonoscopy, then will schedule. ? ?History ?Patient Active Problem List  ? Diagnosis Date Noted  ? Cancer of right colon ([place]) 12/23/2019  ? Acute upper GI bleed 06/13/2019  ? Cecal polyp 05/26/2019  ? History of colonic polyps 05/26/2019  ? Abnormal CT scan, colon 05/26/2019  ? Syphilis 04/19/2018  ? Secondary polycythemia 05/26/2016  ? Pruritic rash 04/21/2015  ? Erectile dysfunction 03/02/2012  ? Allergic conjunctivitis 03/02/2012  ? HTN (hypertension) 10/06/2010  ? Smoking 10/06/2010  ? BPH (benign prostatic hyperplasia) 10/06/2010  ? COLONIC POLYPS 10/17/2008  ? CYSTS OF ORAL SOFT TISSUES 10/17/2008  ? DIVERTICULITIS OF COLON 10/17/2008  ? Human immunodeficiency virus (  HIV) disease ([place]) 06/06/2008  ? Pancytopenia ([place]) 06/06/2008  ? LUNG NODULE 06/06/2008  ? RENAL CALCULUS 06/06/2008  ? ?Past Medical History:  ?Diagnosis Date  ? BPH (benign prostatic hyperplasia)   ? Colon cancer ([place])   ? Erectile dysfunction   ? History of kidney stones   ? passed stones  ? HIV infection ([place])   ? Hypertension   ? Pruritic rash 04/21/2015  ? Secondary polycythemia 05/26/2016  ? Syphilis 04/19/2018  ? ?Past Surgical History:  ?Procedure Laterality Date  ? BIOPSY  02/11/2020  ? Procedure: BIOPSY;  Surgeon: Irving Copas., MD;  Location: [facility];  Service:  Gastroenterology;;  ? COLONOSCOPY    ? COLONOSCOPY WITH PROPOFOL N/A 06/13/2019  ? Procedure: COLONOSCOPY WITH PROPOFOL;  Surgeon: Mansouraty, Telford Nab., MD;  Location: [facility];  Service: Gastroenterology;  Laterality: N/A;  ? COLONOSCOPY WITH PROPOFOL N/A 02/11/2020  ? Procedure: COLONOSCOPY WITH PROPOFOL;  Surgeon: Mansouraty, Telford Nab., MD;  Location: [facility];  Service: Gastroenterology;  Laterality: N/A;  ? ENDOSCOPIC MUCOSAL RESECTION N/A 06/13/2019  ? Procedure: ENDOSCOPIC MUCOSAL RESECTION;  Surgeon: Rush Landmark Telford Nab., MD;  Location: [facility];  Service: Gastroenterology;  Laterality: N/A;  ? HEMOSTASIS CLIP PLACEMENT  06/13/2019  ? Procedure: HEMOSTASIS CLIP PLACEMENT;  Surgeon: Irving Copas., MD;  Location: [facility];  Service: Gastroenterology;;  ? HEMOSTASIS CONTROL  06/13/2019  ? Procedure: HEMOSTASIS CONTROL;  Surgeon: Rush Landmark Telford Nab., MD;  Location: [facility];  Service: Gastroenterology;;  ? IR [place] VESSEL  06/13/2019  ? IR ANGIOGRAM VISCERAL SELECTIVE  06/13/2019  ? IR EMBO ART  VEN HEMORR LYMPH EXTRAV  INC GUIDE ROADMAPPING  06/13/2019  ? IR US GUIDE VASC ACCESS RIGHT  06/13/2019  ? PARTIAL COLECTOMY    ? SCLEROTHERAPY  06/13/2019  ? Procedure: SCLEROTHERAPY;  Surgeon: Mansouraty, Telford Nab., MD;  Location: [facility];  Service: Gastroenterology;;  ? [place] INJECTION  06/13/2019  ? Procedure: SUBMUCOSAL LIFTING INJECTION;  Surgeon: Irving Copas., MD;  Location: [facility];  Service: Gastroenterology;;  ? ?No Known Allergies ?Prior to Admission medications   ?Medication Sig Start Date End Date Taking? Authorizing Provider  ?amLODipine (NORVASC) 10 MG tablet Take 1 tablet (10 mg total) by mouth daily. 11/26/20  Yes Wendie Agreste, MD  ?Dolutegravir-lamiVUDine (DOVATO) 50-300 MG TABS Take 1 tablet by mouth daily. 11/07/20  Yes Tommy Medal, Lavell Islam, MD  ? ?Social History  ? ?Socioeconomic History  ? Marital  status: Single  ?  Spouse name: Not on file  ? Number of children: 0  ? Years of education: Not on file  ? Highest education level: Not on file  ?Occupational History  ? Occupation: retired  ?Tobacco Use  ? Smoking status: Every Day  ?  Packs/day: 1.00  ?  Years: 44.00  ?  Pack years: 44.00  ?  Types: Cigarettes  ?  Start date: 08/01/2016  ? Smokeless tobacco: Never  ?Vaping Use  ? Vaping Use: Never used  ?Substance and Sexual Activity  ? Alcohol use: Yes  ?  Comment: social rare  ? Drug use: No  ? Sexual activity: Yes  ?  Comment: given condoms  ?Other Topics Concern  ? Not on file  ?Social History Narrative  ? Not on file  ? ?Social Determinants of Health  ? ?Financial Resource Strain: Not on file  ?Food Insecurity: Not on file  ?Transportation Needs: Not on file  ?Physical Activity: Not on file  ?Stress: Not  on file  ?Social Connections: Not on file  ?Intimate Partner Violence: Not on file  ? ? ?Review of Systems  ?Constitutional:  Negative for fatigue and unexpected weight change.  ?Eyes:  Negative for visual disturbance.  ?Respiratory:  Negative for cough, chest tightness and shortness of breath.   ?Cardiovascular:  Negative for chest pain, palpitations and leg swelling.  ?Gastrointestinal:  Negative for abdominal pain and blood in stool.  ?Neurological:  Negative for dizziness, light-headedness and headaches.  ? ? ?Objective:  ? ?Vitals:  ? 05/27/21 0838  ?BP: 140/68  ?Pulse: 64  ?Resp: 16  ?Temp: 98.2 ?F (36.8 ?C)  ?TempSrc: Temporal  ?SpO2: 100%  ?Weight: 174 lb 9.6 oz (79.2 kg)  ?Height: '5\' 10"'$  (1.778 m)  ? ? ? ?Physical Exam ?Vitals reviewed.  ?Constitutional:   ?   Appearance: He is well-developed.  ?HENT:  ?   Head: Normocephalic and atraumatic.  ?Neck:  ?   Vascular: No carotid bruit or JVD.  ?Cardiovascular:  ?   Rate and Rhythm: Normal rate and regular rhythm.  ?   Heart sounds: Normal heart sounds. No murmur heard. ?Pulmonary:  ?   Effort: Pulmonary effort is normal.  ?   Breath sounds: Normal breath  sounds. No rales.  ?Abdominal:  ?   General: Abdomen is flat.  ?   Palpations: Abdomen is soft.  ?   Tenderness: There is no abdominal tenderness.  ?Musculoskeletal:  ?   Right lower leg: No edema.  ?   Left lower

## 2021-05-27 NOTE — Patient Instructions (Addendum)
Ok to remain on same dose amlodipine if you can cut back on salt/sodium in diet. If remains over 130/80 - let me know.  ? ?With previous aortic atherosclerosis I would consider a cholesterol medication.  Let me know if you change your mind on that or would like to discuss it further. ? ?Keep follow-up with your specialist.   I reviewed your most recent blood work and since you will likely have blood work in the next few months I did not check any today.  Let me know if there are questions and take care. ? ? ?Managing Your Hypertension ?Hypertension, also called high blood pressure, is when the force of the blood pressing against the walls of the arteries is too strong. Arteries are blood vessels that carry blood from your heart throughout your body. Hypertension forces the heart to work harder to pump blood and may cause the arteries to become narrow or stiff. ?Understanding blood pressure readings ?A blood pressure reading includes a higher number over a lower number: ?The first, or top, number is called the systolic pressure. It is a measure of the pressure in your arteries as your heart beats. ?The second, or bottom number, is called the diastolic pressure. It is a measure of the pressure in your arteries as the heart relaxes. ?For most people, a normal blood pressure is below 120/80. Your personal target blood pressure may vary depending on your medical conditions, your age, and other factors. ?Blood pressure is classified into four stages. Based on your blood pressure reading, your health care provider may use the following stages to determine what type of treatment you need, if any. Systolic pressure and diastolic pressure are measured in a unit called millimeters of mercury (mmHg). ?Normal ?Systolic pressure: below 161. ?Diastolic pressure: below 80. ?Elevated ?Systolic pressure: 096-045. ?Diastolic pressure: below 80. ?Hypertension stage 1 ?Systolic pressure: 409-811. ?Diastolic pressure:  91-47. ?Hypertension stage 2 ?Systolic pressure: 829 or above. ?Diastolic pressure: 90 or above. ?How can this condition affect me? ?Managing your hypertension is very important. Over time, hypertension can damage the arteries and decrease blood flow to parts of the body, including the brain, heart, and kidneys. Having untreated or uncontrolled hypertension can lead to: ?A heart attack. ?A stroke. ?A weakened blood vessel (aneurysm). ?Heart failure. ?Kidney damage. ?Eye damage. ?Memory and concentration problems. ?Vascular dementia. ?What actions can I take to manage this condition? ?Hypertension can be managed by making lifestyle changes and possibly by taking medicines. Your health care provider will help you make a plan to bring your blood pressure within a normal range. You may be referred for counseling on a healthy diet and physical activity. ?Nutrition ? ?Eat a diet that is high in fiber and potassium, and low in salt (sodium), added sugar, and fat. An example eating plan is called the DASH diet. DASH stands for Dietary Approaches to Stop Hypertension. To eat this way: ?Eat plenty of fresh fruits and vegetables. Try to fill one-half of your plate at each meal with fruits and vegetables. ?Eat whole grains, such as whole-wheat pasta, brown rice, or whole-grain bread. Fill about one-fourth of your plate with whole grains. ?Eat low-fat dairy products. ?Avoid fatty cuts of meat, processed or cured meats, and poultry with skin. Fill about one-fourth of your plate with lean proteins such as fish, chicken without skin, beans, eggs, and tofu. ?Avoid pre-made and processed foods. These tend to be higher in sodium, added sugar, and fat. ?Reduce your daily sodium intake. Many people with  hypertension should eat less than 1,500 mg of sodium a day. ?Lifestyle ? ?Work with your health care provider to maintain a healthy body weight or to lose weight. Ask what an ideal weight is for you. ?Get at least 30 minutes of exercise  that causes your heart to beat faster (aerobic exercise) most days of the week. Activities may include walking, swimming, or biking. ?Include exercise to strengthen your muscles (resistance exercise), such as weight lifting, as part of your weekly exercise routine. Try to do these types of exercises for 30 minutes at least 3 days a week. ?Do not use any products that contain nicotine or tobacco. These products include cigarettes, chewing tobacco, and vaping devices, such as e-cigarettes. If you need help quitting, ask your health care provider. ?Control any long-term (chronic) conditions you have, such as high cholesterol or diabetes. ?Identify your sources of stress and find ways to manage stress. This may include meditation, deep breathing, or making time for fun activities. ?Alcohol use ?Do not drink alcohol if: ?Your health care provider tells you not to drink. ?You are pregnant, may be pregnant, or are planning to become pregnant. ?If you drink alcohol: ?Limit how much you have to: ?0-1 drink a day for women. ?0-2 drinks a day for men. ?Know how much alcohol is in your drink. In the [place] one drink equals one 12 oz bottle of beer (355 mL), one 5 oz glass of wine (148 mL), or one 1? oz glass of hard liquor (44 mL). ?Medicines ?Your health care provider may prescribe medicine if lifestyle changes are not enough to get your blood pressure under control and if: ?Your systolic blood pressure is 130 or higher. ?Your diastolic blood pressure is 80 or higher. ?Take medicines only as told by your health care provider. Follow the directions carefully. Blood pressure medicines must be taken as told by your health care provider. The medicine does not work as well when you skip doses. Skipping doses also puts you at risk for problems. ?Monitoring ?Before you monitor your blood pressure: ?Do not smoke, drink caffeinated beverages, or exercise within 30 minutes before taking a measurement. ?Use the bathroom and empty your  bladder (urinate). ?Sit quietly for at least 5 minutes before taking measurements. ?Monitor your blood pressure at home as told by your health care provider. To do this: ?Sit with your back straight and supported. ?Place your feet flat on the floor. Do not cross your legs. ?Support your arm on a flat surface, such as a table. Make sure your upper arm is at heart level. ?Each time you measure, take two or three readings one minute apart and record the results. ?You may also need to have your blood pressure checked regularly by your health care provider. ?General information ?Talk with your health care provider about your diet, exercise habits, and other lifestyle factors that may be contributing to hypertension. ?Review all the medicines you take with your health care provider because there may be side effects or interactions. ?Keep all follow-up visits. Your health care provider can help you create and adjust your plan for managing your high blood pressure. ?Where to find more information ?National Heart, Lung, and Blood Institute: https://wilson-eaton.com/ ?American Heart Association: www.heart.org ?Contact a health care provider if: ?You think you are having a reaction to medicines you have taken. ?You have repeated (recurrent) headaches. ?You feel dizzy. ?You have swelling in your ankles. ?You have trouble with your vision. ?Get help right away if: ?You develop a severe  headache or confusion. ?You have unusual weakness or numbness, or you feel faint. ?You have severe pain in your chest or abdomen. ?You vomit repeatedly. ?You have trouble breathing. ?These symptoms may be an emergency. Get help right away. Call 911. ?Do not wait to see if the symptoms will go away. ?Do not drive yourself to the hospital. ?Summary ?Hypertension is when the force of blood pumping through your arteries is too strong. If this condition is not controlled, it may put you at risk for serious complications. ?Your personal target blood pressure  may vary depending on your medical conditions, your age, and other factors. For most people, a normal blood pressure is less than 120/80. ?Hypertension is managed by lifestyle changes, medicines, or both. ?Lifestyle changes

## 2021-08-03 ENCOUNTER — Other Ambulatory Visit: Payer: Self-pay

## 2021-08-03 ENCOUNTER — Inpatient Hospital Stay: Payer: Medicare Other | Attending: Hematology

## 2021-08-03 DIAGNOSIS — Z85038 Personal history of other malignant neoplasm of large intestine: Secondary | ICD-10-CM | POA: Diagnosis present

## 2021-08-03 DIAGNOSIS — Z21 Asymptomatic human immunodeficiency virus [HIV] infection status: Secondary | ICD-10-CM | POA: Insufficient documentation

## 2021-08-03 DIAGNOSIS — C180 Malignant neoplasm of cecum: Secondary | ICD-10-CM

## 2021-08-03 DIAGNOSIS — F17210 Nicotine dependence, cigarettes, uncomplicated: Secondary | ICD-10-CM | POA: Diagnosis not present

## 2021-08-03 LAB — CBC WITH DIFFERENTIAL (CANCER CENTER ONLY)
Abs Immature Granulocytes: 0.01 10*3/uL (ref 0.00–0.07)
Basophils Absolute: 0 10*3/uL (ref 0.0–0.1)
Basophils Relative: 1 %
Eosinophils Absolute: 0.1 10*3/uL (ref 0.0–0.5)
Eosinophils Relative: 2 %
HCT: 50.3 % (ref 39.0–52.0)
Hemoglobin: 17.9 g/dL — ABNORMAL HIGH (ref 13.0–17.0)
Immature Granulocytes: 0 %
Lymphocytes Relative: 32 %
Lymphs Abs: 1.9 10*3/uL (ref 0.7–4.0)
MCH: 33.5 pg (ref 26.0–34.0)
MCHC: 35.6 g/dL (ref 30.0–36.0)
MCV: 94 fL (ref 80.0–100.0)
Monocytes Absolute: 0.6 10*3/uL (ref 0.1–1.0)
Monocytes Relative: 10 %
Neutro Abs: 3.4 10*3/uL (ref 1.7–7.7)
Neutrophils Relative %: 55 %
Platelet Count: 219 10*3/uL (ref 150–400)
RBC: 5.35 MIL/uL (ref 4.22–5.81)
RDW: 13.3 % (ref 11.5–15.5)
WBC Count: 6.1 10*3/uL (ref 4.0–10.5)
nRBC: 0 % (ref 0.0–0.2)

## 2021-08-03 LAB — CMP (CANCER CENTER ONLY)
ALT: 20 U/L (ref 0–44)
AST: 17 U/L (ref 15–41)
Albumin: 4.1 g/dL (ref 3.5–5.0)
Alkaline Phosphatase: 78 U/L (ref 38–126)
Anion gap: 7 (ref 5–15)
BUN: 14 mg/dL (ref 8–23)
CO2: 23 mmol/L (ref 22–32)
Calcium: 9.5 mg/dL (ref 8.9–10.3)
Chloride: 107 mmol/L (ref 98–111)
Creatinine: 1.34 mg/dL — ABNORMAL HIGH (ref 0.61–1.24)
GFR, Estimated: 57 mL/min — ABNORMAL LOW (ref >60)
Glucose, Bld: 98 mg/dL (ref 70–99)
Potassium: 4.5 mmol/L (ref 3.5–5.1)
Sodium: 137 mmol/L (ref 135–145)
Total Bilirubin: 0.5 mg/dL (ref 0.3–1.2)
Total Protein: 7.3 g/dL (ref 6.5–8.1)

## 2021-08-03 LAB — CEA (IN HOUSE-CHCC): CEA (CHCC-In House): 5.71 ng/mL — ABNORMAL HIGH (ref 0.00–5.00)

## 2021-08-09 NOTE — Progress Notes (Unsigned)
[place]   Telephone:(336) [phone] Fax:([phone]) [phone]   Clinic Follow up Note   Patient Care Team: Wendie Agreste, MD as PCP - General (Family Medicine) Tommy Medal, Lavell Islam, MD as PCP - Infectious Diseases (Infectious Diseases) Jonnie Finner, RN (Inactive) as Oncology Nurse Navigator Truitt Merle, MD as Consulting Physician (Hematology) Alla Feeling, NP as Nurse Practitioner (Nurse Practitioner) Mansouraty, Telford Nab., MD as Consulting Physician (Gastroenterology)  Date of Service:  08/09/2021  I connected with [name] on 08/09/2021 at  8:20 AM EDT by telephone visit and verified that I am speaking with the correct person using two identifiers.  I discussed the limitations, risks, security and privacy concerns of performing an evaluation and management service by telephone and the availability of in person appointments. I also discussed with the patient that there may be a patient responsible charge related to this service. The patient expressed understanding and agreed to proceed.   Other persons participating in the visit and their role in the encounter:  none  Patient's location:  home Provider's location:  my office  CHIEF COMPLAINT: f/u of colon cancer  CURRENT THERAPY:  Observation  ASSESSMENT & PLAN:  [name] is a 71 y.o. male with   1. Adenocarcinoma of the cecum, poorly differentiated, pT1NXM0, stage I, MMR normal  -He has a large cecal polyp on first screening colonoscopy in 03/2019 which was negative for malignancy, repeat colonoscopy 3 months later in 06/2019 showed multiple foci of poorly differentiated adenocarcinoma extending into superficial submucosa to a depth of 0.3, when it was resected by polypectomy. CT was negative for metastatic disease.  -Pt declined surgery with hemicolectomy due to lack of support after surgery. He understands he will need close surveillance going forward.  -last colonoscopy and CT in 02/2020 were negative   -   2. HIV -diagnosed 1984, controlled per patient  -on [place] per Dr. Tommy Medal     PLAN: -I will reach out to Dr. Rush Landmark regarding his preference of  CT colonography over colonoscopy, and plan to schedule it in 6 months  -Lab and F/u in 6 months after CT    No problem-specific Assessment & Plan notes found for this encounter.   SUMMARY OF ONCOLOGIC HISTORY: Oncology History Overview Note  Cancer Staging Cancer of right colon [facility]) Staging form: Colon and Rectum, AJCC 8th Edition - Clinical stage from 06/13/2019: Stage I (cT1, cN0, cM0) - Signed by Truitt Merle, MD on 12/23/2019    Cancer of right colon [facility])  03/13/2019 Procedure   Colonoscopy by Dr Havery Moros IMPRESSION - The examined portion of the ileum was normal. - Polypoid lesion in the cecum overlying the appendiceal orifice as outlined. Biopsied. - Diverticulosis in the sigmoid colon and in the cecum. - Internal hemorrhoids. - The examination was otherwise normal.     Diagnosis Surgical [P], colon, cecum polyp/polypoid lesion - SUPERFICIAL FRAGMENTS OF TUBULOVILLOUS ADENOMA WITH FOCAL HIGH-GRADE DYSPLASIA - NEGATIVE FOR CARCINOMA   03/28/2019 Imaging   CT AP W contrast  IMPRESSION: 1. 3.5 cm cecal mass, corresponding to the colonoscopy abnormality. No pericolonic extension or adjacent adenopathy. 2. Hepatic morphology which is suspicious for mild cirrhosis. Recannulized paraumbilical vein, suggesting portal venous hypertension. 3.  Aortic Atherosclerosis (ICD10-I70.0). 4. Mild prostatomegaly.   06/13/2019 Cancer Staging   Staging form: Colon and Rectum, AJCC 8th Edition - Clinical stage from 06/13/2019: Stage I (cT1, cN0, cM0) - Signed by Truitt Merle, MD on 12/23/2019   06/13/2019  Procedure   Colonoscopy for polypectomy by Dr Rush Landmark  IMPRESSION - Hemorrhoids found on digital rectal exam. - The examined portion of the ileum was normal. - One 45-50 mm polyp in the cecum, removed with piecemeal mucosal  resection. Resected and retrieved. Bleeding was noted during the procedure. Injected with Epinephrine as above. Clips (MR conditional) were placed. Persistent oozing noted. Hemostatic spray applied. - Diverticulosis in the recto-sigmoid colon, in the sigmoid colon, in the descending colon and in the ascending colon. - Non-bleeding non-thrombosed external and internal hemorrhoids   06/13/2019 Initial Biopsy   FINAL MICROSCOPIC DIAGNOSIS:   A. COLON, CECUM, POLYPECTOMY:  - Invasive adenocarcinoma, poorly differentiated, scattered small foci.  - Adenocarcinoma extends into superficial submucosa (depth of 0.3 cm).  - The surgical resection margins are negative for carcinoma.  - See oncology table below.    Mismatch Repair Protein (IHC)   SUMMARY INTERPRETATION: NORMAL    IHC EXPRESSION RESULTS   TEST           RESULT  MLH1:          Preserved nuclear expression  MSH2:          Preserved nuclear expression  MSH6:          Preserved nuclear expression  PMS2:          Preserved nuclear expression    06/19/2019 Imaging   CT Chest  IMPRESSION: 1. No evidence of metastatic disease in the chest. 2. Finely irregular liver surface suggests cirrhosis. 3. Aortic Atherosclerosis (ICD10-I70.0) and Emphysema (ICD10-J43.9).       12/23/2019 Initial Diagnosis   Cancer of right colon ([place])   02/11/2020 Procedure   Colonoscopy by Dr. Rush Landmark  -Hemorrhoids found on digital rectal exam. - The examined portion of the ileum was normal. - Post mucosectomy scar in the cecum with areas of likely clip artifact noted in the cecal base though potential for residual adenoma is present. The cecal base was resected with a cold snare. - Diverticulosis in the recto-sigmoid colon and in the sigmoid colon. - Normal mucosa in the entire examined colon otherwise. - Non-bleeding non-thrombosed external and internal hemorrhoids   02/11/2020 Pathology Results   FINAL MICROSCOPIC DIAGNOSIS:  A. COLON, CECUM  BASE, BIOPSY:  - Colonic mucosa with benign lymphoid aggregates.  - No dysplasia, adenomatous change or carcinoma.    02/21/2020 Imaging   CT CAP  IMPRESSION: 1. Persistent soft tissue prominence in the base of the cecum. The possibility of residual tumor should be considered, and correlation with repeat colonoscopy is suggested if clinically appropriate. 2. No definitive findings to suggest metastatic disease in the chest, abdomen or pelvis. 3. Morphologic changes in the liver indicative of underlying cirrhosis. 4. Aortic atherosclerosis. 5. There are calcifications of the aortic valve and mitral valve. Echocardiographic correlation for evaluation of potential valvular dysfunction may be warranted if clinically indicated. 6. Additional incidental findings, as above      INTERVAL HISTORY:  [name] was contacted for a follow up of colon cancer. He identified himself by birth date. We last spoke by phone on 06/13/20.  He reports he is doing very well; he denies pain or stomach issues.    All other systems were reviewed with the patient and are negative.  MEDICAL HISTORY:  Past Medical History:  Diagnosis Date   BPH (benign prostatic hyperplasia)    Colon cancer [facility])    Erectile dysfunction    History of kidney stones    passed stones  HIV infection ([place])    Hypertension    Pruritic rash 04/21/2015   Secondary polycythemia 05/26/2016   Syphilis 04/19/2018    SURGICAL HISTORY: Past Surgical History:  Procedure Laterality Date   BIOPSY  02/11/2020   Procedure: BIOPSY;  Surgeon: Irving Copas., MD;  Location: [place];  Service: Gastroenterology;;   COLONOSCOPY     COLONOSCOPY WITH PROPOFOL N/A 06/13/2019   Procedure: COLONOSCOPY WITH PROPOFOL;  Surgeon: Irving Copas., MD;  Location: [place];  Service: Gastroenterology;  Laterality: N/A;   COLONOSCOPY WITH PROPOFOL N/A 02/11/2020   Procedure: COLONOSCOPY WITH PROPOFOL;  Surgeon: Rush Landmark  Telford Nab., MD;  Location: [place];  Service: Gastroenterology;  Laterality: N/A;   ENDOSCOPIC MUCOSAL RESECTION N/A 06/13/2019   Procedure: ENDOSCOPIC MUCOSAL RESECTION;  Surgeon: Rush Landmark Telford Nab., MD;  Location: [place];  Service: Gastroenterology;  Laterality: N/A;   HEMOSTASIS CLIP PLACEMENT  06/13/2019   Procedure: HEMOSTASIS CLIP PLACEMENT;  Surgeon: Irving Copas., MD;  Location: [place];  Service: Gastroenterology;;   HEMOSTASIS CONTROL  06/13/2019   Procedure: HEMOSTASIS CONTROL;  Surgeon: Irving Copas., MD;  Location: [place];  Service: Gastroenterology;;   IR ANGIOGRAM SELECTIVE EACH ADDITIONAL VESSEL  06/13/2019   IR ANGIOGRAM VISCERAL SELECTIVE  06/13/2019   IR EMBO ART  VEN HEMORR LYMPH EXTRAV  INC GUIDE ROADMAPPING  06/13/2019   IR US GUIDE VASC ACCESS RIGHT  06/13/2019   PARTIAL COLECTOMY     SCLEROTHERAPY  06/13/2019   Procedure: SCLEROTHERAPY;  Surgeon: Mansouraty, Telford Nab., MD;  Location: [place];  Service: Gastroenterology;;   SUBMUCOSAL LIFTING INJECTION  06/13/2019   Procedure: SUBMUCOSAL LIFTING INJECTION;  Surgeon: Irving Copas., MD;  Location: [place];  Service: Gastroenterology;;    I have reviewed the social history and family history with the patient and they are unchanged from previous note.  ALLERGIES:  has No Known Allergies.  MEDICATIONS:  Current Outpatient Medications  Medication Sig Dispense Refill   amLODipine (NORVASC) 10 MG tablet Take 1 tablet (10 mg total) by mouth daily. 90 tablet 2   Dolutegravir-lamiVUDine (DOVATO) 50-300 MG TABS Take 1 tablet by mouth daily. 30 tablet 11   No current facility-administered medications for this visit.    PHYSICAL EXAMINATION: ECOG PERFORMANCE STATUS: 0 - Asymptomatic  There were no vitals filed for this visit. Wt Readings from Last 3 Encounters:  05/27/21 174 lb 9.6 oz (79.2 kg)  03/17/21 172 lb (78 kg)  11/26/20 173 lb 6.4 oz (78.7 kg)      No vitals taken today, Exam not performed today  LABORATORY DATA:  I have reviewed the data as listed    Latest Ref Rng & Units 08/03/2021    8:00 AM 01/12/2021    8:07 AM 11/26/2020    9:16 AM  CBC  WBC 4.0 - 10.5 K/uL 6.1  6.5  4.7   Hemoglobin 13.0 - 17.0 g/dL 17.9  16.8  17.5   Hematocrit 39.0 - 52.0 % 50.3  49.4  52.3   Platelets 150 - 400 K/uL 219  204  194.0         Latest Ref Rng & Units 08/03/2021    8:00 AM 01/12/2021    8:07 AM 11/26/2020    9:16 AM  CMP  Glucose 70 - 99 mg/dL 98  90  84   BUN 8 - 23 mg/dL $Remove'14  12  11   'bwlLyZt$ Creatinine 0.61 - 1.24 mg/dL 1.34  1.21  1.20   Sodium 135 -  145 mmol/L 137  140  138   Potassium 3.5 - 5.1 mmol/L 4.5  4.4  4.9   Chloride 98 - 111 mmol/L 107  107  104   CO2 22 - 32 mmol/L $RemoveB'23  24  30   'NrwMGKnG$ Calcium 8.9 - 10.3 mg/dL 9.5  9.0  9.5   Total Protein 6.5 - 8.1 g/dL 7.3  7.4  7.3   Total Bilirubin 0.3 - 1.2 mg/dL 0.5  0.6  0.7   Alkaline Phos 38 - 126 U/L 78  73  73   AST 15 - 41 U/L $Remo'17  18  21   'fPkmQ$ ALT 0 - 44 U/L $Remo'20  17  20       'xtmYf$ RADIOGRAPHIC STUDIES: I have personally reviewed the radiological images as listed and agreed with the findings in the report. No results found.    No orders of the defined types were placed in this encounter.  All questions were answered. The patient knows to call the clinic with any problems, questions or concerns. No barriers to learning was detected. The total time spent in the appointment was 22 minutes.     Truitt Merle, MD 08/09/2021   I, Wilburn Mylar, am acting as scribe for Truitt Merle, MD.   I have reviewed the above documentation for accuracy and completeness, and I agree with the above.

## 2021-08-10 ENCOUNTER — Inpatient Hospital Stay: Payer: Medicare Other | Admitting: Hematology

## 2021-08-10 ENCOUNTER — Inpatient Hospital Stay (HOSPITAL_BASED_OUTPATIENT_CLINIC_OR_DEPARTMENT_OTHER): Payer: Medicare Other | Admitting: Hematology

## 2021-08-10 DIAGNOSIS — B20 Human immunodeficiency virus [HIV] disease: Secondary | ICD-10-CM

## 2021-08-10 DIAGNOSIS — Z79899 Other long term (current) drug therapy: Secondary | ICD-10-CM

## 2021-08-10 DIAGNOSIS — C182 Malignant neoplasm of ascending colon: Secondary | ICD-10-CM | POA: Diagnosis not present

## 2021-08-11 ENCOUNTER — Telehealth: Payer: Self-pay

## 2021-08-11 ENCOUNTER — Telehealth: Payer: Self-pay | Admitting: Hematology

## 2021-08-11 NOTE — Telephone Encounter (Signed)
-----   Message from Irving Copas., MD sent at 08/10/2021  4:57 PM EDT ----- YF, Thanks for the update. Arsenio Schnorr, this patient should have a colonoscopy follow up.  He can have that with me.  It can be done in the [facility], next available that works on his schedule.  If he has other issues, that he wants to discuss, we can see in clinic otherwise. Please update Dr. Burr Medico and I when he is scheduled. Thanks. GM ----- Message ----- From: Truitt Merle, MD Sent: 08/10/2021   9:17 AM EDT To: Irving Copas., MD  Gabe,  He is overdue for colonoscopy. He is in [place] now, will be back in a few weeks. Please call him to set up. He is agreeable.   Thanks   Krista Blue

## 2021-08-11 NOTE — Telephone Encounter (Signed)
Left message on machine to call back  

## 2021-08-11 NOTE — Telephone Encounter (Signed)
Scheduled follow-up appointments per 7/10 los. Patient is aware.

## 2021-08-12 NOTE — Telephone Encounter (Signed)
Left message on machine to call back  

## 2021-08-13 NOTE — Telephone Encounter (Signed)
I was able to reach the pt by phone. He has been scheduled for previsit and [facility] colon.  All questions answered to the best of my ability.

## 2021-08-13 NOTE — Telephone Encounter (Signed)
PT returning call. Please reach back out to advise.

## 2021-08-20 ENCOUNTER — Encounter: Payer: Self-pay | Admitting: Internal Medicine

## 2021-09-29 ENCOUNTER — Ambulatory Visit (AMBULATORY_SURGERY_CENTER): Payer: Medicare Other

## 2021-09-29 VITALS — Ht 70.0 in | Wt 174.0 lb

## 2021-09-29 DIAGNOSIS — Z85038 Personal history of other malignant neoplasm of large intestine: Secondary | ICD-10-CM

## 2021-09-29 NOTE — Progress Notes (Signed)
No egg or soy allergy known to patient  No issues known to pt with past sedation with any surgeries or procedures Patient denies ever being told they had issues or difficulty with intubation  No FH of Malignant Hyperthermia Pt is not on diet pills Pt is not on  home 02  Pt is not on blood thinners  Pt denies issues with constipation  No A fib or A flutter Have any cardiac testing pending--denied Pt instructed to use Singlecare.com or GoodRx for a price reduction on prep   

## 2021-10-06 ENCOUNTER — Ambulatory Visit (AMBULATORY_SURGERY_CENTER): Payer: Medicare Other | Admitting: Gastroenterology

## 2021-10-06 ENCOUNTER — Encounter: Payer: Self-pay | Admitting: Gastroenterology

## 2021-10-06 VITALS — BP 117/73 | HR 53 | Temp 98.0°F | Resp 21 | Ht 70.0 in | Wt 174.0 lb

## 2021-10-06 DIAGNOSIS — Z08 Encounter for follow-up examination after completed treatment for malignant neoplasm: Secondary | ICD-10-CM | POA: Diagnosis not present

## 2021-10-06 DIAGNOSIS — Z85038 Personal history of other malignant neoplasm of large intestine: Secondary | ICD-10-CM | POA: Diagnosis not present

## 2021-10-06 MED ORDER — SODIUM CHLORIDE 0.9 % IV SOLN: 500.0000 mL | Freq: Once | INTRAVENOUS | Status: DC

## 2021-10-06 NOTE — Progress Notes (Signed)
PT taken to PACU. Monitors in place. VSS. Report given to RN. 

## 2021-10-06 NOTE — Op Note (Signed)
[place] Patient Name: [name] Procedure Date: 10/06/2021 7:28 AM MRN: [id number] Endoscopist: Justice Britain , MD Age: 71 Referring MD:  Date of Birth: [birth year] Gender: Male Account #: [id number] Procedure:                Colonoscopy Indications:              High risk colon cancer surveillance: Personal                            history of colon cancer of the cecum s/p endoscopic                            resection and declined surgical interventions Medicines:                Monitored Anesthesia Care Procedure:                Pre-Anesthesia Assessment:                           - Prior to the procedure, a History and Physical                            was performed, and patient medications and                            allergies were reviewed. The patient's tolerance of                            previous anesthesia was also reviewed. The risks                            and benefits of the procedure and the sedation                            options and risks were discussed with the patient.                            All questions were answered, and informed consent                            was obtained. Prior Anticoagulants: The patient has                            taken no previous anticoagulant or antiplatelet                            agents. ASA Grade Assessment: II - A patient with                            mild systemic disease. After reviewing the risks                            and benefits, the patient was deemed in  satisfactory condition to undergo the procedure.                           After obtaining informed consent, the colonoscope                            was passed under direct vision. Throughout the                            procedure, the patient's blood pressure, pulse, and                            oxygen saturations were monitored continuously. The                            Olympus CF-[id number]  (#[id number]) Colonoscope was                            introduced through the anus and advanced to the the                            cecum, identified by appendiceal orifice and                            ileocecal valve. The colonoscopy was performed                            without difficulty. The patient tolerated the                            procedure. The quality of the bowel preparation was                            adequate. The ileocecal valve, appendiceal orifice,                            and rectum were photographed. Scope In: 8:06:05 AM Scope Out: 8:20:34 AM Scope Withdrawal Time: 0 hours 12 minutes 15 seconds  Total Procedure Duration: 0 hours 14 minutes 29 seconds  Findings:                 The digital rectal exam findings include                            hemorrhoids. Pertinent negatives include no                            palpable rectal lesions.                           A medium post mucosectomy scar was found in the                            cecum base adjacent to the AO. There was no                              evidence of the previous polyp.                           One protuberant appendiceal orifice was noted with                            what appears to be stool impaction more than                            submucosal lesion. I placed a forceps within the                            appendiceal orifice as was able to remove some                            stool and this led to partial deflation of the                            region.                           Multiple small-mouthed diverticula were found in                            the entire colon.                           Normal mucosa was found in the entire colon                            otherwise.                           Non-bleeding non-thrombosed external and internal                            hemorrhoids were found during retroflexion, during                            perianal exam  and during digital exam. The                            hemorrhoids were Grade II (internal hemorrhoids                            that prolapse but reduce spontaneously). Complications:            No immediate complications. Estimated Blood Loss:     Estimated blood loss: none. Estimated blood loss                            was minimal. Impression:               - Hemorrhoids found on digital rectal exam.                           -   Post mucosectomy scar in the cecum.                           - Appendiceal orifice was protuberant with what                            appears to be more stool impaction rather than                            submucosal nodule. Stool was removed with forceps                            from within the appendiceal orifice and it had                            partial deflation.                           - Diverticulosis in the entire examined colon.                           - Normal mucosa in the entire examined colon                            otherwise.                           - Non-bleeding non-thrombosed external and internal                            hemorrhoids. Recommendation:           - The patient will be observed post-procedure,                            until all discharge criteria are met.                           - Discharge patient to home.                           - Patient has a contact number available for                            emergencies. The signs and symptoms of potential                            delayed complications were discussed with the                            patient. Return to normal activities tomorrow.                            Written discharge instructions were provided to the  patient.                           - Resume previous diet.                           - Continue present medications.                           - Recommend undergoing previously ordered CT-AP for                             follow up post-resection of Colon cancer with                            decline of previous surgical interventions. This                            will be for imaging of the abdomen and also to                            evaluate the appendiceal orifice, that looked as if                            it had bulged due to appendicolith v stool                            impaction. However in setting of previous colon                            cancer in area we need to be vigilant and ensure                            nothing else is being missed.                           - Repeat Colonoscopy based on final CT imaging, but                            likely repeat in 1-year but could do sooner if                            there are findings concerning on CT.                           - The findings and recommendations were discussed                            with the patient.                           - The findings and recommendations were discussed                            with   the designated responsible adult. Gabriel Mansouraty, MD 10/06/2021 8:38:18 AM 

## 2021-10-06 NOTE — Patient Instructions (Addendum)
Read all of the handouts given to you by your recovery room nurse.   Dr Rush Landmark will consult with your oncologist,and they will decide when your next CT will be.  YOU HAD AN ENDOSCOPIC PROCEDURE TODAY AT [facility]:   Refer to the procedure report that was given to you for any specific questions about what was found during the examination.  If the procedure report does not answer your questions, please call your gastroenterologist to clarify.  If you requested that your care partner not be given the details of your procedure findings, then the procedure report has been included in a sealed envelope for you to review at your convenience later.  YOU SHOULD EXPECT: Some feelings of bloating in the abdomen. Passage of more gas than usual.   Please Note:  You might notice some irritation and congestion in your nose or some drainage.  This is from the oxygen used during your procedure.  There is no need for concern and it should clear up in a day or so.  SYMPTOMS TO REPORT IMMEDIATELY:  Following lower endoscopy (colonoscopy or flexible sigmoidoscopy):  Excessive amounts of blood in the stool  Significant tenderness or worsening of abdominal pains  Swelling of the abdomen that is new, acute  Fever of 100F or higher   For urgent or emergent issues, a gastroenterologist can be reached at any hour by calling [phone]. Do not use MyChart messaging for urgent concerns.    DIET:  We do recommend a small meal at first, but then you may proceed to your regular diet.  Drink plenty of fluids but you should avoid alcoholic beverages for 24 hours. Try to increase the fiber in your diet, and drink plenty of water.  ACTIVITY:  You should plan to take it easy for the rest of today and you should NOT DRIVE or use heavy machinery until tomorrow (because of the sedation medicines used during the test).    FOLLOW UP: Our staff will call the number listed on your records the next  business day following your procedure.  We will call around 7:15- 8:00 am to check on you and address any questions or concerns that you may have regarding the information given to you following your procedure. If we do not reach you, we will leave a message.  If you develop any symptoms (ie: fever, flu-like symptoms, shortness of breath, cough etc.) before then, please call [phone].  If you test positive for Covid 19 in the 2 weeks post procedure, please call and report this information to [place].     SIGNATURES/CONFIDENTIALITY: You and/or your care partner have signed paperwork which will be entered into your electronic medical record.  These signatures attest to the fact that that the information above on your After Visit Summary has been reviewed and is understood.  Full responsibility of the confidentiality of this discharge information lies with you and/or your care-partner.

## 2021-10-06 NOTE — Progress Notes (Signed)
Pt's states no medical or surgical changes since previsit or office visit. 

## 2021-10-06 NOTE — Progress Notes (Signed)
GASTROENTEROLOGY PROCEDURE H&P NOTE   Primary Care Physician: Wendie Agreste, MD  HPI: [name] is a 71 y.o. male who presents for colonoscopy for follow-up colon cancer and previous endoscopic resection and declining surgical intervention.  Past Medical History:  Diagnosis Date   BPH (benign prostatic hyperplasia)    Colon cancer [facility])    Erectile dysfunction    History of kidney stones    passed stones   HIV infection ([place])    Hypertension    Pruritic rash 04/21/2015   Secondary polycythemia 05/26/2016   Syphilis 04/19/2018   Past Surgical History:  Procedure Laterality Date   BIOPSY  02/11/2020   Procedure: BIOPSY;  Surgeon: Irving Copas., MD;  Location: [facility];  Service: Gastroenterology;;   COLONOSCOPY     COLONOSCOPY WITH PROPOFOL N/A 06/13/2019   Procedure: COLONOSCOPY WITH PROPOFOL;  Surgeon: Irving Copas., MD;  Location: [place];  Service: Gastroenterology;  Laterality: N/A;   COLONOSCOPY WITH PROPOFOL N/A 02/11/2020   Procedure: COLONOSCOPY WITH PROPOFOL;  Surgeon: Rush Landmark Telford Nab., MD;  Location: [place];  Service: Gastroenterology;  Laterality: N/A;   ENDOSCOPIC MUCOSAL RESECTION N/A 06/13/2019   Procedure: ENDOSCOPIC MUCOSAL RESECTION;  Surgeon: Rush Landmark Telford Nab., MD;  Location: [place];  Service: Gastroenterology;  Laterality: N/A;   HEMOSTASIS CLIP PLACEMENT  06/13/2019   Procedure: HEMOSTASIS CLIP PLACEMENT;  Surgeon: Irving Copas., MD;  Location: [place];  Service: Gastroenterology;;   HEMOSTASIS CONTROL  06/13/2019   Procedure: HEMOSTASIS CONTROL;  Surgeon: Irving Copas., MD;  Location: [place];  Service: Gastroenterology;;   IR ANGIOGRAM SELECTIVE EACH ADDITIONAL VESSEL  06/13/2019   IR ANGIOGRAM VISCERAL SELECTIVE  06/13/2019   IR EMBO ART  VEN HEMORR LYMPH EXTRAV  INC GUIDE ROADMAPPING  06/13/2019   IR US GUIDE VASC ACCESS RIGHT  06/13/2019   PARTIAL COLECTOMY      SCLEROTHERAPY  06/13/2019   Procedure: SCLEROTHERAPY;  Surgeon: Mansouraty, Telford Nab., MD;  Location: [place];  Service: Gastroenterology;;   SUBMUCOSAL LIFTING INJECTION  06/13/2019   Procedure: SUBMUCOSAL LIFTING INJECTION;  Surgeon: Irving Copas., MD;  Location: [place];  Service: Gastroenterology;;   Current Outpatient Medications  Medication Sig Dispense Refill   amLODipine (NORVASC) 10 MG tablet Take 1 tablet (10 mg total) by mouth daily. 90 tablet 2   Dolutegravir-lamiVUDine (DOVATO) 50-300 MG TABS Take 1 tablet by mouth daily. 30 tablet 11   Current Facility-Administered Medications  Medication Dose Route Frequency Provider Last Rate Last Admin   0.9 %  sodium chloride infusion  500 mL Intravenous Once Mansouraty, Telford Nab., MD        Current Outpatient Medications:    amLODipine (NORVASC) 10 MG tablet, Take 1 tablet (10 mg total) by mouth daily., Disp: 90 tablet, Rfl: 2   Dolutegravir-lamiVUDine (DOVATO) 50-300 MG TABS, Take 1 tablet by mouth daily., Disp: 30 tablet, Rfl: 11  Current Facility-Administered Medications:    0.9 %  sodium chloride infusion, 500 mL, Intravenous, Once, Mansouraty, Telford Nab., MD No Known Allergies Family History  Problem Relation Age of Onset   Heart failure Mother    Stroke Father    Colon cancer Neg Hx    Esophageal cancer Neg Hx    Stomach cancer Neg Hx    Rectal cancer Neg Hx    Inflammatory bowel disease Neg Hx    Liver disease Neg Hx    Pancreatic cancer Neg Hx    Colon polyps Neg Hx  Social History   Socioeconomic History   Marital status: Single    Spouse name: Not on file   Number of children: 0   Years of education: Not on file   Highest education level: Not on file  Occupational History   Occupation: retired  Tobacco Use   Smoking status: Every Day    Packs/day: 1.00    Years: 44.00    Total pack years: 44.00    Types: Cigarettes    Start date: 08/01/2016   Smokeless tobacco: Never  Vaping Use    Vaping Use: Never used  Substance and Sexual Activity   Alcohol use: Yes    Comment: social rare   Drug use: No   Sexual activity: Yes    Comment: given condoms  Other Topics Concern   Not on file  Social History Narrative   Not on file   Social Determinants of Health   Financial Resource Strain: Not on file  Food Insecurity: Not on file  Transportation Needs: Not on file  Physical Activity: Not on file  Stress: Not on file  Social Connections: Not on file  Intimate Partner Violence: Not on file    Physical Exam: Today's Vitals   10/06/21 0711 10/06/21 0714  BP: (!) 167/91   Pulse: 67   Temp: 98 F (36.7 C) 98 F (36.7 C)  SpO2: 98%   Weight: 174 lb (78.9 kg)   Height: '5\' 10"'$  (1.778 m)    Body mass index is 24.97 kg/m. GEN: NAD EYE: Sclerae anicteric ENT: MMM CV: Non-tachycardic GI: Soft, NT/ND NEURO:  Alert & Oriented x 3  Lab Results: No results for input(s): "WBC", "HGB", "HCT", "PLT" in the last 72 hours. BMET No results for input(s): "NA", "K", "CL", "CO2", "GLUCOSE", "BUN", "CREATININE", "CALCIUM" in the last 72 hours. LFT No results for input(s): "PROT", "ALBUMIN", "AST", "ALT", "ALKPHOS", "BILITOT", "BILIDIR", "IBILI" in the last 72 hours. PT/INR No results for input(s): "LABPROT", "INR" in the last 72 hours.   Impression / Plan: This is a 71 y.o.male who presents for colonoscopy for follow-up colon cancer and previous endoscopic resection and declining surgical intervention.  The risks and benefits of endoscopic evaluation/treatment were discussed with the patient and/or family; these include but are not limited to the risk of perforation, infection, bleeding, missed lesions, lack of diagnosis, severe illness requiring hospitalization, as well as anesthesia and sedation related illnesses.  The patient's history has been reviewed, patient examined, no change in status, and deemed stable for procedure.  The patient and/or family is agreeable to  proceed.    Justice Britain, MD [facility] Office # [phone]

## 2021-10-07 ENCOUNTER — Telehealth: Payer: Self-pay | Admitting: *Deleted

## 2021-10-07 NOTE — Telephone Encounter (Signed)
  Follow up Call-     10/06/2021    7:14 AM 03/13/2019   10:39 AM  Call back number  Post procedure Call Back phone  # [phone]  Permission to leave phone message Yes Yes     Patient questions:  Do you have a fever, pain , or abdominal swelling? No. Pain Score  0 *  Have you tolerated food without any problems? Yes.    Have you been able to return to your normal activities? Yes.    Do you have any questions about your discharge instructions: Diet   No. Medications  No. Follow up visit  No.  Do you have questions or concerns about your Care? No.  Actions: * If pain score is 4 or above: No action needed, pain <4.

## 2021-11-10 ENCOUNTER — Inpatient Hospital Stay: Payer: Medicare Other | Attending: Hematology

## 2021-11-10 DIAGNOSIS — C182 Malignant neoplasm of ascending colon: Secondary | ICD-10-CM

## 2021-11-10 DIAGNOSIS — Z85038 Personal history of other malignant neoplasm of large intestine: Secondary | ICD-10-CM | POA: Diagnosis present

## 2021-11-10 LAB — COMPREHENSIVE METABOLIC PANEL
ALT: 29 U/L (ref 0–44)
AST: 21 U/L (ref 15–41)
Albumin: 4.4 g/dL (ref 3.5–5.0)
Alkaline Phosphatase: 70 U/L (ref 38–126)
Anion gap: 4 — ABNORMAL LOW (ref 5–15)
BUN: 11 mg/dL (ref 8–23)
CO2: 27 mmol/L (ref 22–32)
Calcium: 9.5 mg/dL (ref 8.9–10.3)
Chloride: 104 mmol/L (ref 98–111)
Creatinine, Ser: 1.38 mg/dL — ABNORMAL HIGH (ref 0.61–1.24)
GFR, Estimated: 55 mL/min — ABNORMAL LOW (ref >60)
Glucose, Bld: 101 mg/dL — ABNORMAL HIGH (ref 70–99)
Potassium: 4.7 mmol/L (ref 3.5–5.1)
Sodium: 135 mmol/L (ref 135–145)
Total Bilirubin: 1.4 mg/dL — ABNORMAL HIGH (ref 0.3–1.2)
Total Protein: 7.6 g/dL (ref 6.5–8.1)

## 2021-11-10 LAB — CBC WITH DIFFERENTIAL/PLATELET
Abs Immature Granulocytes: 0.04 10*3/uL (ref 0.00–0.07)
Basophils Absolute: 0 10*3/uL (ref 0.0–0.1)
Basophils Relative: 0 %
Eosinophils Absolute: 0.1 10*3/uL (ref 0.0–0.5)
Eosinophils Relative: 1 %
HCT: 52.7 % — ABNORMAL HIGH (ref 39.0–52.0)
Hemoglobin: 18.8 g/dL — ABNORMAL HIGH (ref 13.0–17.0)
Immature Granulocytes: 1 %
Lymphocytes Relative: 18 %
Lymphs Abs: 1.2 10*3/uL (ref 0.7–4.0)
MCH: 34.6 pg — ABNORMAL HIGH (ref 26.0–34.0)
MCHC: 35.7 g/dL (ref 30.0–36.0)
MCV: 97.1 fL (ref 80.0–100.0)
Monocytes Absolute: 0.6 10*3/uL (ref 0.1–1.0)
Monocytes Relative: 8 %
Neutro Abs: 4.9 10*3/uL (ref 1.7–7.7)
Neutrophils Relative %: 72 %
Platelets: 172 10*3/uL (ref 150–400)
RBC: 5.43 MIL/uL (ref 4.22–5.81)
RDW: 13.2 % (ref 11.5–15.5)
WBC: 6.8 10*3/uL (ref 4.0–10.5)
nRBC: 0 % (ref 0.0–0.2)

## 2021-11-10 LAB — CEA (IN HOUSE-CHCC): CEA (CHCC-In House): 5.63 ng/mL — ABNORMAL HIGH (ref 0.00–5.00)

## 2021-11-12 ENCOUNTER — Encounter: Payer: Self-pay | Admitting: Nurse Practitioner

## 2021-11-17 ENCOUNTER — Other Ambulatory Visit: Payer: Self-pay | Admitting: Lab

## 2021-11-17 DIAGNOSIS — I10 Essential (primary) hypertension: Secondary | ICD-10-CM

## 2021-11-17 MED ORDER — AMLODIPINE BESYLATE 10 MG PO TABS: 10.0000 mg | ORAL_TABLET | Freq: Every day | ORAL | 2 refills | Status: DC

## 2021-11-30 ENCOUNTER — Encounter: Payer: Self-pay | Admitting: Family Medicine

## 2021-11-30 ENCOUNTER — Ambulatory Visit (INDEPENDENT_AMBULATORY_CARE_PROVIDER_SITE_OTHER): Payer: Medicare Other | Admitting: Family Medicine

## 2021-11-30 VITALS — BP 128/78 | HR 65 | Temp 97.8°F | Ht 69.0 in | Wt 172.6 lb

## 2021-11-30 DIAGNOSIS — Z720 Tobacco use: Secondary | ICD-10-CM

## 2021-11-30 DIAGNOSIS — C189 Malignant neoplasm of colon, unspecified: Secondary | ICD-10-CM

## 2021-11-30 DIAGNOSIS — I359 Nonrheumatic aortic valve disorder, unspecified: Secondary | ICD-10-CM

## 2021-11-30 DIAGNOSIS — I3481 Nonrheumatic mitral (valve) annulus calcification: Secondary | ICD-10-CM | POA: Diagnosis not present

## 2021-11-30 DIAGNOSIS — I10 Essential (primary) hypertension: Secondary | ICD-10-CM | POA: Diagnosis not present

## 2021-11-30 DIAGNOSIS — D751 Secondary polycythemia: Secondary | ICD-10-CM

## 2021-11-30 MED ORDER — AMLODIPINE BESYLATE 10 MG PO TABS: 10.0000 mg | ORAL_TABLET | Freq: Every day | ORAL | 3 refills | Status: DC

## 2021-11-30 NOTE — Patient Instructions (Signed)
Thanks for coming in today.  No change in dose of amlodipine.  As we discussed I would consider meeting with cardiology regarding the calcification seen on the heart valves and would consider a statin medication for the cholesterol.  Let me know if you change your mind with these recommendations.  No blood work today.  Hemoglobin or red blood cell count was still elevated at recent visit with hematology.  They did recommend blood donation.  I would talk to their office about therapeutic phlebotomy which can help correct the elevated blood count as that does carry some risks as we discussed.  Let me know if I can help with this process.  Let me know if/when you are ready to quit smoking and I am happy to help if needed.  Thanks for coming in today and please let me know if there are questions.  Preventive Care 4 Years and Older, Male Preventive care refers to lifestyle choices and visits with your health care provider that can promote health and wellness. Preventive care visits are also called wellness exams. What can I expect for my preventive care visit? Counseling During your preventive care visit, your health care provider may ask about your: Medical history, including: Past medical problems. Family medical history. History of falls. Current health, including: Emotional well-being. Home life and relationship well-being. Sexual activity. Memory and ability to understand (cognition). Lifestyle, including: Alcohol, nicotine or tobacco, and drug use. Access to firearms. Diet, exercise, and sleep habits. Work and work Statistician. Sunscreen use. Safety issues such as seatbelt and bike helmet use. Physical exam Your health care provider will check your: Height and weight. These may be used to calculate your BMI (body mass index). BMI is a measurement that tells if you are at a healthy weight. Waist circumference. This measures the distance around your waistline. This measurement also  tells if you are at a healthy weight and may help predict your risk of certain diseases, such as type 2 diabetes and high blood pressure. Heart rate and blood pressure. Body temperature. Skin for abnormal spots. What immunizations do I need?  Vaccines are usually given at various ages, according to a schedule. Your health care provider will recommend vaccines for you based on your age, medical history, and lifestyle or other factors, such as travel or where you work. What tests do I need? Screening Your health care provider may recommend screening tests for certain conditions. This may include: Lipid and cholesterol levels. Diabetes screening. This is done by checking your blood sugar (glucose) after you have not eaten for a while (fasting). Hepatitis C test. Hepatitis B test. HIV (human immunodeficiency virus) test. STI (sexually transmitted infection) testing, if you are at risk. Lung cancer screening. Colorectal cancer screening. Prostate cancer screening. Abdominal aortic aneurysm (AAA) screening. You may need this if you are a current or former smoker. Talk with your health care provider about your test results, treatment options, and if necessary, the need for more tests. Follow these instructions at home: Eating and drinking  Eat a diet that includes fresh fruits and vegetables, whole grains, lean protein, and low-fat dairy products. Limit your intake of foods with high amounts of sugar, saturated fats, and salt. Take vitamin and mineral supplements as recommended by your health care provider. Do not drink alcohol if your health care provider tells you not to drink. If you drink alcohol: Limit how much you have to 0-2 drinks a day. Know how much alcohol is in your drink. In the  [place] one drink equals one 12 oz bottle of beer (355 mL), one 5 oz glass of wine (148 mL), or one 1 oz glass of hard liquor (44 mL). Lifestyle Brush your teeth every morning and night with fluoride  toothpaste. Floss one time each day. Exercise for at least 30 minutes 5 or more days each week. Do not use any products that contain nicotine or tobacco. These products include cigarettes, chewing tobacco, and vaping devices, such as e-cigarettes. If you need help quitting, ask your health care provider. Do not use drugs. If you are sexually active, practice safe sex. Use a condom or other form of protection to prevent STIs. Take aspirin only as told by your health care provider. Make sure that you understand how much to take and what form to take. Work with your health care provider to find out whether it is safe and beneficial for you to take aspirin daily. Ask your health care provider if you need to take a cholesterol-lowering medicine (statin). Find healthy ways to manage stress, such as: Meditation, yoga, or listening to music. Journaling. Talking to a trusted person. Spending time with friends and family. Safety Always wear your seat belt while driving or riding in a vehicle. Do not drive: If you have been drinking alcohol. Do not ride with someone who has been drinking. When you are tired or distracted. While texting. If you have been using any mind-altering substances or drugs. Wear a helmet and other protective equipment during sports activities. If you have firearms in your house, make sure you follow all gun safety procedures. Minimize exposure to UV radiation to reduce your risk of skin cancer. What's next? Visit your health care provider once a year for an annual wellness visit. Ask your health care provider how often you should have your eyes and teeth checked. Stay up to date on all vaccines. This information is not intended to replace advice given to you by your health care provider. Make sure you discuss any questions you have with your health care provider. Document Revised: 07/16/2020 Document Reviewed: 07/16/2020 Elsevier Patient Education  [place].

## 2021-11-30 NOTE — Progress Notes (Signed)
Subjective:  Patient ID: [name], male    DOB: 03-23-1950  Age: 71 y.o. MRN: [id number]  CC:  Chief Complaint  Patient presents with   Hypertension   Colon Cancer    HPI [name] presents for follow up chronic conditions.  Care team: PCP, me.  Gastroenterology, Dr. Rush Landmark.  History of R colon cancer.  Colonoscopy 10/06/2021.Post mucosectomy scar in the cecum, appendiceal orifice protuberant with what appeared to be stool impaction rather than submucosal nodule.  Stool removed with forceps from the appendiceal orifice, diverticulosis, normal mucosa in the examined colon otherwise.  Nonbleeding nonthrombosed external and internal hemorrhoids plan for CT of abdomen pelvis for follow-up postresection of colon cancer and based on CT imaging likely repeat colonoscopy 1 year. Oncology, Dr.Feng Infectious disease, Dr. Drucilla Schmidt, HIV treated with Dovato. Will be scheduling appt soon.   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 Slowed down some. Spending time with friends. No change in health issues.   Hypertension: Amlodipine 10 mg daily. No new side effects.  No ankle swelling.  Home readings: 120-130/70's BP Readings from Last 3 Encounters:  11/30/21 128/78  10/06/21 117/73  05/27/21 140/68   Lab Results  Component Value Date   CREATININE 1.38 (H) 11/10/2021  Mild elevated creatinine October 10, similar to July 3.  Has ongoing follow-up with oncology and blood work.  Aortic atherosclerosis Noted on previous imaging, not on statin when discussed last year, no known history of CAD/PAD.  Calcifications of aortic and mitral valves were noted on previous CT imaging, cardiology referral discussed but declined. Declines cardiology referral or statin at this time.  No CP, dyspnea on exertion. No lightheadedness.  Discussed repeat testing, declined as he would not take any medication even if levels were higher. Lab Results  Component Value Date   CHOL 157 11/26/2020   HDL  42.70 11/26/2020   LDLCALC 101 (H) 11/26/2020   TRIG 71.0 11/26/2020   CHOLHDL 4 11/26/2020   History of secondary polycythemia.  Recent testing with hematology/oncology. Similar to last year. Blood donation recommended - hx of HIV.  Recommended discussing therpeutic phlebotomy with oncology and risks of increased HGB discussed.  Lab Results  Component Value Date   WBC 6.8 11/10/2021   HGB 18.8 (H) 11/10/2021   HCT 52.7 (H) 11/10/2021   MCV 97.1 11/10/2021   PLT 172 11/10/2021       11/30/2021    8:00 AM 05/27/2021    8:40 AM 03/17/2021    1:46 PM 11/26/2020    8:13 AM 05/29/2020    8:19 AM  Depression screen PHQ 2/9  Decreased Interest 0 0 0 0 0  Down, Depressed, Hopeless 0 0 0 0 0  PHQ - 2 Score 0 0 0 0 0  Altered sleeping 0      Tired, decreased energy 0      Change in appetite 0      Feeling bad or failure about yourself  0      Trouble concentrating 0      Moving slowly or fidgety/restless 0      Suicidal thoughts 0      PHQ-9 Score 0        Health Maintenance  Topic Date Due   Medicare Annual Wellness (AWV)  11/26/2021   COVID-19 Vaccine (6 - Moderna risk series) 12/16/2021 (Originally 12/24/2020)   Lung Cancer Screening  01/30/2022 (Originally 02/20/2021)   Zoster Vaccines- Shingrix (1 of 2) 03/02/2022 (Originally 12/05/1970)  TETANUS/TDAP  12/01/2022 (Originally 12/05/1970)   COLONOSCOPY (Pts 45-41yr Insurance coverage will need to be confirmed)  10/07/2022   Pneumonia Vaccine 70 Years old  Completed   INFLUENZA VACCINE  Completed   Hepatitis C Screening  Completed   HPV VACCINES  Aged Out  Colonoscopy as above.  Prostate: does NOT have family history of prostate cancer The natural history of prostate cancer and ongoing controversy regarding screening and potential treatment outcomes of prostate cancer has been discussed with the patient. The meaning of a false positive PSA and a false negative PSA has been discussed. He indicates understanding of the  limitations of this screening test and wishes NOT to proceed with screening PSA testing. History of BPH. No difficulty urinating. Nocturia 2 times per night - stable.  Lab Results  Component Value Date   PSA 0.25 Test Methodology: Hybritech PSA 05/15/2008     Immunization History  Administered Date(s) Administered   DTP 09/01/2009   Fluad Quad(high Dose 65+) 10/23/2018, 10/22/2021   H1N1 06/06/2008   Hepatitis A 07/23/2008, 09/01/2009   Hepatitis B 07/23/2008, 09/01/2009, 10/06/2010   Influenza Split 10/06/2011, 10/02/2012, 10/02/2013   Influenza Whole 11/14/2008, 09/01/2009, 10/06/2010   Influenza, High Dose Seasonal PF 10/15/2017   Influenza,inj,Quad PF,6+ Mos 11/03/2015, 12/16/2016   Influenza-Unspecified 12/19/2014, 11/17/2019, 10/02/2020   Moderna Sars-Covid-2 Vaccination 03/16/2019, 04/13/2019, 06/17/2019, 05/05/2020, 10/29/2020   PPD Test 06/06/2008   Pneumococcal Conjugate-13 10/23/2018   Pneumococcal Polysaccharide-23 06/06/2008, 11/29/2019  [facility], flu [facility]   Health maintenance No results found. Wears one contact for reading. Will be following up with optho.   Dental:  Regular care with dentist. Implants.   Alcohol: rare  Tobacco: smoking  - 1 pack per day. No plans on quitting.   Exercise: walking.    History Patient Active Problem List   Diagnosis Date Noted   [place] [place] 12/23/2019   Acute upper GI bleed 06/13/2019   Cecal polyp 05/26/2019   History of colonic polyps 05/26/2019   Abnormal CT scan, colon 05/26/2019   Syphilis 04/19/2018   Secondary polycythemia 05/26/2016   Pruritic rash 04/21/2015   Erectile dysfunction 03/02/2012   Allergic conjunctivitis 03/02/2012   HTN (hypertension) 10/06/2010   Smoking 10/06/2010   BPH (benign prostatic hyperplasia) 10/06/2010   COLONIC POLYPS 10/17/2008   CYSTS OF ORAL SOFT TISSUES 10/17/2008   DIVERTICULITIS OF COLON  10/17/2008   Human immunodeficiency virus (HIV) disease ([facility] 06/06/2008   Pancytopenia ([facility] 06/06/2008   LUNG NODULE 06/06/2008   RENAL CALCULUS 06/06/2008   Past Medical History:  Diagnosis Date   BPH (benign prostatic hyperplasia)    [facility]on cancer ([facility]    Erectile dysfunction    History of kidney stones    passed stones   HIV infection [place]    Hypertension    Pruritic rash 04/21/2015   Secondary polycythemia 05/26/2016   Syphilis 04/19/2018   Past Surgical History:  Procedure Laterality Date   BIOPSY  02/11/2020   Procedure: BIOPSY;  Surgeon: MIrving Copas, MD;  [facility]  Service: Gastroenterology;;   COLONOSCOPY     COLONOSCOPY WITH PROPOFOL N/A 06/13/2019   Procedure: COLONOSCOPY WITH PROPOFOL;  Surgeon: MIrving Copas, MD;  [facility]  Service: Gastroenterology;  Laterality: N/A;   COLONOSCOPY WITH PROPOFOL N/A 02/11/2020   Procedure: COLONOSCOPY WITH PROPOFOL;  Surgeon: MRush LandmarkGTelford Nab, MD;  [facility] [facility]  Service: Gastroenterology;  Laterality: N/A;  ENDOSCOPIC MUCOSAL RESECTION N/A 06/13/2019   Procedure: ENDOSCOPIC MUCOSAL RESECTION;  Surgeon: Rush Landmark Telford Nab., MD;  Location: [place];  Service: Gastroenterology;  Laterality: N/A;   HEMOSTASIS CLIP PLACEMENT  06/13/2019   Procedure: HEMOSTASIS CLIP PLACEMENT;  Surgeon: Irving Copas., MD;  Location: [place];  Service: Gastroenterology;;   HEMOSTASIS CONTROL  06/13/2019   Procedure: HEMOSTASIS CONTROL;  Surgeon: Irving Copas., MD;  Location: [place];  Service: Gastroenterology;;   IR ANGIOGRAM SELECTIVE EACH ADDITIONAL VESSEL  06/13/2019   IR ANGIOGRAM VISCERAL SELECTIVE  06/13/2019   IR EMBO ART  VEN HEMORR LYMPH EXTRAV  INC GUIDE ROADMAPPING  06/13/2019   IR US GUIDE VASC ACCESS RIGHT  06/13/2019   PARTIAL COLECTOMY     SCLEROTHERAPY  06/13/2019   Procedure: SCLEROTHERAPY;  Surgeon: Mansouraty, Telford Nab., MD;   Location: [place];  Service: Gastroenterology;;   SUBMUCOSAL LIFTING INJECTION  06/13/2019   Procedure: SUBMUCOSAL LIFTING INJECTION;  Surgeon: Irving Copas., MD;  Location: [place];  Service: Gastroenterology;;   No Known Allergies Prior to Admission medications   Medication Sig Start Date End Date Taking? Authorizing Provider  amLODipine (NORVASC) 10 MG tablet Take 1 tablet (10 mg total) by mouth daily. 11/17/21  Yes Wendie Agreste, MD  Dolutegravir-lamiVUDine (DOVATO) 50-300 MG TABS Take 1 tablet by mouth daily. 11/07/20  Yes Tommy Medal, Lavell Islam, MD   Social History   Socioeconomic History   Marital status: Single    Spouse name: Not on file   Number of children: 0   Years of education: Not on file   Highest education level: Not on file  Occupational History   Occupation: retired  Tobacco Use   Smoking status: Every Day    Packs/day: 1.00    Years: 44.00    Total pack years: 44.00    Types: Cigarettes    Start date: 08/01/2016   Smokeless tobacco: Never  Vaping Use   Vaping Use: Never used  Substance and Sexual Activity   Alcohol use: Yes    Comment: social rare   Drug use: No   Sexual activity: Yes    Comment: given condoms  Other Topics Concern   Not on file  Social History Narrative   Not on file   Social Determinants of Health   Financial Resource Strain: Not on file  Food Insecurity: Not on file  Transportation Needs: Not on file  Physical Activity: Not on file  Stress: Not on file  Social Connections: Not on file  Intimate Partner Violence: Not on file    Review of Systems  Constitutional:  Negative for fatigue and unexpected weight change.  Eyes:  Negative for visual disturbance.  Respiratory:  Negative for cough, chest tightness and shortness of breath.   Cardiovascular:  Negative for chest pain, palpitations and leg swelling.  Gastrointestinal:  Negative for abdominal pain and blood in stool.  Neurological:  Negative for  dizziness, light-headedness and headaches.     Objective:   Vitals:   11/30/21 0801  BP: 128/78  Pulse: 65  Temp: 97.8 F (36.6 C)  SpO2: 100%  Weight: 172 lb 9.6 oz (78.3 kg)  Height: '5\' 9"'$  (1.753 m)     Physical Exam Vitals reviewed.  Constitutional:      Appearance: He is well-developed.  HENT:     Head: Normocephalic and atraumatic.  Neck:     Vascular: No carotid bruit or JVD.  Cardiovascular:     Rate and Rhythm: Normal rate  and regular rhythm.     Heart sounds: Normal heart sounds. No murmur heard.    Comments: No murmur, no click.  Pulmonary:     Effort: Pulmonary effort is normal.     Breath sounds: Normal breath sounds. No rales.  Abdominal:     General: Bowel sounds are normal. There is no distension.     Tenderness: There is no abdominal tenderness.  Musculoskeletal:     Right lower leg: No edema.     Left lower leg: No edema.  Skin:    General: Skin is warm and dry.  Neurological:     Mental Status: He is alert and oriented to person, place, and time.  Psychiatric:        Mood and Affect: Mood normal.        Assessment & Plan:  [name] is a 71 y.o. male . Essential hypertension  -  Stable, tolerating current regimen. Medications refilled. Labs noted as above, borderline creatinine discussed, avoidance of NSAIDs discussed as well as maintenance of hydration, follow-up blood work with specialist.  Calcification of mitral valve Calcification of aortic valve  -Asymptomatic, no murmur.  Declined cardiology eval.  Secondary polycythemia  -Recommended therapeutic phlebotomy -recommended he discuss with hematologist.  Unable to donate blood with history of HIV.  Tobacco use  -No intention to quit at this time.  Health risks discussed.  Colon cancer  -Recent colonoscopy noted.  Follow-up with specialists as planned.  No orders of the defined types were placed in this encounter.  Patient Instructions  Thanks for coming in today.  No  change in dose of amlodipine.  As we discussed I would consider meeting with cardiology regarding the calcification seen on the heart valves and would consider a statin medication for the cholesterol.  Let me know if you change your mind with these recommendations.  No blood work today.  Hemoglobin or red blood cell count was still elevated at recent visit with hematology.  They did recommend blood donation.  I would talk to their office about therapeutic phlebotomy which can help correct the elevated blood count as that does carry some risks as we discussed.  Let me know if I can help with this process.  Let me know if/when you are ready to quit smoking and I am happy to help if needed.  Thanks for coming in today and please let me know if there are questions.  Preventive Care 78 Years and Older, Male Preventive care refers to lifestyle choices and visits with your health care provider that can promote health and wellness. Preventive care visits are also called wellness exams. What can I expect for my preventive care visit? Counseling During your preventive care visit, your health care provider may ask about your: Medical history, including: Past medical problems. Family medical history. History of falls. Current health, including: Emotional well-being. Home life and relationship well-being. Sexual activity. Memory and ability to understand (cognition). Lifestyle, including: Alcohol, nicotine or tobacco, and drug use. Access to firearms. Diet, exercise, and sleep habits. Work and work Statistician. Sunscreen use. Safety issues such as seatbelt and bike helmet use. Physical exam Your health care provider will check your: Height and weight. These may be used to calculate your BMI (body mass index). BMI is a measurement that tells if you are at a healthy weight. Waist circumference. This measures the distance around your waistline. This measurement also tells if you are at a healthy  weight and may help predict your  risk of certain diseases, such as type 2 diabetes and high blood pressure. Heart rate and blood pressure. Body temperature. Skin for abnormal spots. What immunizations do I need?  Vaccines are usually given at various ages, according to a schedule. Your health care provider will recommend vaccines for you based on your age, medical history, and lifestyle or other factors, such as travel or where you work. What tests do I need? Screening Your health care provider may recommend screening tests for certain conditions. This may include: Lipid and cholesterol levels. Diabetes screening. This is done by checking your blood sugar (glucose) after you have not eaten for a while (fasting). Hepatitis C test. Hepatitis B test. HIV (human immunodeficiency virus) test. STI (sexually transmitted infection) testing, if you are at risk. Lung cancer screening. Colorectal cancer screening. Prostate cancer screening. Abdominal aortic aneurysm (AAA) screening. You may need this if you are a current or former smoker. Talk with your health care provider about your test results, treatment options, and if necessary, the need for more tests. Follow these instructions at home: Eating and drinking  Eat a diet that includes fresh fruits and vegetables, whole grains, lean protein, and low-fat dairy products. Limit your intake of foods with high amounts of sugar, saturated fats, and salt. Take vitamin and mineral supplements as recommended by your health care provider. Do not drink alcohol if your health care provider tells you not to drink. If you drink alcohol: Limit how much you have to 0-2 drinks a day. Know how much alcohol is in your drink. In the [place] one drink equals one 12 oz bottle of beer (355 mL), one 5 oz glass of wine (148 mL), or one 1 oz glass of hard liquor (44 mL). Lifestyle Brush your teeth every morning and night with fluoride toothpaste. Floss one time each  day. Exercise for at least 30 minutes 5 or more days each week. Do not use any products that contain nicotine or tobacco. These products include cigarettes, chewing tobacco, and vaping devices, such as e-cigarettes. If you need help quitting, ask your health care provider. Do not use drugs. If you are sexually active, practice safe sex. Use a condom or other form of protection to prevent STIs. Take aspirin only as told by your health care provider. Make sure that you understand how much to take and what form to take. Work with your health care provider to find out whether it is safe and beneficial for you to take aspirin daily. Ask your health care provider if you need to take a cholesterol-lowering medicine (statin). Find healthy ways to manage stress, such as: Meditation, yoga, or listening to music. Journaling. Talking to a trusted person. Spending time with friends and family. Safety Always wear your seat belt while driving or riding in a vehicle. Do not drive: If you have been drinking alcohol. Do not ride with someone who has been drinking. When you are tired or distracted. While texting. If you have been using any mind-altering substances or drugs. Wear a helmet and other protective equipment during sports activities. If you have firearms in your house, make sure you follow all gun safety procedures. Minimize exposure to UV radiation to reduce your risk of skin cancer. What's next? Visit your health care provider once a year for an annual wellness visit. Ask your health care provider how often you should have your eyes and teeth checked. Stay up to date on all vaccines. This information is not intended to replace advice given  to you by your health care provider. Make sure you discuss any questions you have with your health care provider. Document Revised: 07/16/2020 Document Reviewed: 07/16/2020 Elsevier Patient Education  [place],   Merri Ray,  MD [place] 11/30/21 8:49 AM

## 2021-12-14 ENCOUNTER — Encounter: Payer: Self-pay | Admitting: Nurse Practitioner

## 2021-12-21 ENCOUNTER — Telehealth: Payer: Self-pay | Admitting: Family Medicine

## 2021-12-21 NOTE — Telephone Encounter (Signed)
Left message for patient to call back and schedule Medicare Annual Wellness Visit (AWV) in office.   If not able to come in office, please offer to do virtually or by telephone.  Left office number and my jabber [phone].  Last AWV:11/26/2020   Please schedule at anytime with Nurse Health Advisor.

## 2021-12-28 ENCOUNTER — Other Ambulatory Visit: Payer: Self-pay

## 2021-12-28 MED ORDER — DOVATO 50-300 MG PO TABS: 1.0000 | ORAL_TABLET | Freq: Every day | ORAL | 1 refills | Status: DC

## 2021-12-31 ENCOUNTER — Other Ambulatory Visit: Payer: Self-pay

## 2021-12-31 DIAGNOSIS — Z113 Encounter for screening for infections with a predominantly sexual mode of transmission: Secondary | ICD-10-CM

## 2021-12-31 DIAGNOSIS — B20 Human immunodeficiency virus [HIV] disease: Secondary | ICD-10-CM

## 2021-12-31 DIAGNOSIS — Z79899 Other long term (current) drug therapy: Secondary | ICD-10-CM

## 2022-01-04 ENCOUNTER — Other Ambulatory Visit: Payer: Medicare Other

## 2022-01-04 ENCOUNTER — Other Ambulatory Visit (HOSPITAL_COMMUNITY)
Admission: RE | Admit: 2022-01-04 | Discharge: 2022-01-04 | Disposition: A | Discharge status: Home / Self Care | Payer: Medicare Other | Source: Ambulatory Visit | Attending: Infectious Disease | Admitting: Infectious Disease

## 2022-01-04 ENCOUNTER — Other Ambulatory Visit: Payer: Self-pay

## 2022-01-04 DIAGNOSIS — Z79899 Other long term (current) drug therapy: Secondary | ICD-10-CM

## 2022-01-04 DIAGNOSIS — Z113 Encounter for screening for infections with a predominantly sexual mode of transmission: Secondary | ICD-10-CM | POA: Diagnosis present

## 2022-01-04 DIAGNOSIS — B20 Human immunodeficiency virus [HIV] disease: Secondary | ICD-10-CM

## 2022-01-05 LAB — URINE CYTOLOGY ANCILLARY ONLY
Chlamydia: NEGATIVE
Comment: NEGATIVE
Comment: NORMAL
Neisseria Gonorrhea: NEGATIVE

## 2022-01-05 LAB — T-HELPER CELL (CD4) - (RCID CLINIC ONLY)
CD4 % Helper T Cell: 22 % — ABNORMAL LOW (ref 33–65)
CD4 T Cell Abs: 475 /uL (ref 400–1790)

## 2022-01-07 LAB — CBC WITH DIFFERENTIAL/PLATELET
Absolute Monocytes: 647 cells/uL (ref 200–950)
Basophils Absolute: 31 cells/uL (ref 0–200)
Basophils Relative: 0.4 %
Eosinophils Absolute: 156 cells/uL (ref 15–500)
Eosinophils Relative: 2 %
HCT: 52.2 % — ABNORMAL HIGH (ref 38.5–50.0)
Hemoglobin: 18.6 g/dL — ABNORMAL HIGH (ref 13.2–17.1)
Lymphs Abs: 2395 cells/uL (ref 850–3900)
MCH: 34.4 pg — ABNORMAL HIGH (ref 27.0–33.0)
MCHC: 35.6 g/dL (ref 32.0–36.0)
MCV: 96.7 fL (ref 80.0–100.0)
MPV: 10.3 fL (ref 7.5–12.5)
Monocytes Relative: 8.3 %
Neutro Abs: 4571 cells/uL (ref 1500–7800)
Neutrophils Relative %: 58.6 %
Platelets: 233 10*3/uL (ref 140–400)
RBC: 5.4 10*6/uL (ref 4.20–5.80)
RDW: 12.9 % (ref 11.0–15.0)
Total Lymphocyte: 30.7 %
WBC: 7.8 10*3/uL (ref 3.8–10.8)

## 2022-01-07 LAB — LIPID PANEL
Cholesterol: 137 mg/dL (ref <200)
HDL: 40 mg/dL (ref >=40)
LDL Cholesterol (Calc): 81 mg/dL (calc)
Non-HDL Cholesterol (Calc): 97 mg/dL (calc) (ref <130)
Total CHOL/HDL Ratio: 3.4 (calc) (ref <5.0)
Triglycerides: 84 mg/dL (ref <150)

## 2022-01-07 LAB — COMPLETE METABOLIC PANEL WITH GFR
AG Ratio: 1.6 (calc) (ref 1.0–2.5)
ALT: 29 U/L (ref 9–46)
AST: 21 U/L (ref 10–35)
Albumin: 4.5 g/dL (ref 3.6–5.1)
Alkaline phosphatase (APISO): 74 U/L (ref 35–144)
BUN: 16 mg/dL (ref 7–25)
CO2: 25 mmol/L (ref 20–32)
Calcium: 9.5 mg/dL (ref 8.6–10.3)
Chloride: 104 mmol/L (ref 98–110)
Creat: 1.23 mg/dL (ref 0.70–1.28)
Globulin: 2.8 g/dL (calc) (ref 1.9–3.7)
Glucose, Bld: 94 mg/dL (ref 65–99)
Potassium: 5 mmol/L (ref 3.5–5.3)
Sodium: 138 mmol/L (ref 135–146)
Total Bilirubin: 0.9 mg/dL (ref 0.2–1.2)
Total Protein: 7.3 g/dL (ref 6.1–8.1)
eGFR: 63 mL/min/{1.73_m2} (ref >=60)

## 2022-01-07 LAB — RPR: RPR Ser Ql: REACTIVE — AB

## 2022-01-07 LAB — FLUORESCENT TREPONEMAL AB(FTA)-IGG-BLD: Fluorescent Treponemal ABS: REACTIVE — AB

## 2022-01-07 LAB — HIV-1 RNA QUANT-NO REFLEX-BLD
HIV 1 RNA Quant: NOT DETECTED Copies/mL
HIV-1 RNA Quant, Log: NOT DETECTED Log cps/mL

## 2022-01-07 LAB — RPR TITER: RPR Titer: 1:4 {titer} — ABNORMAL HIGH

## 2022-01-13 ENCOUNTER — Ambulatory Visit (INDEPENDENT_AMBULATORY_CARE_PROVIDER_SITE_OTHER): Payer: Medicare Other | Admitting: *Deleted

## 2022-01-13 DIAGNOSIS — Z0000 Encounter for general adult medical examination without abnormal findings: Secondary | ICD-10-CM

## 2022-01-13 NOTE — Patient Instructions (Signed)
[name] , Thank you for taking time to come for your Medicare Wellness Visit. I appreciate your ongoing commitment to your health goals. Please review the following plan we discussed and let me know if I can assist you in the future.   These are the goals we discussed:  Goals      Increase physical activity        This is a list of the screening recommended for you and due dates:  Health Maintenance  Topic Date Due   DTaP/Tdap/Td vaccine (2 - Tdap) 09/02/2019   COVID-19 Vaccine (6 - 2023-24 season) 01/04/2022   Screening for Lung Cancer  01/30/2022*   Zoster (Shingles) Vaccine (1 of 2) 03/02/2022*   Colon Cancer Screening  10/07/2022   Medicare Annual Wellness Visit  01/14/2023   Pneumonia Vaccine  Completed   Flu Shot  Completed   Hepatitis C Screening: USPSTF Recommendation to screen - Ages 18-79 yo.  Completed   HPV Vaccine  Aged Out  *Topic was postponed. The date shown is not the original due date.    Advanced directives: Education provided    Preventive Care 70 Years and Older, Male  Preventive care refers to lifestyle choices and visits with your health care provider that can promote health and wellness. What does preventive care include? A yearly physical exam. This is also called an annual well check. Dental exams once or twice a year. Routine eye exams. Ask your health care provider how often you should have your eyes checked. Personal lifestyle choices, including: Daily care of your teeth and gums. Regular physical activity. Eating a healthy diet. Avoiding tobacco and drug use. Limiting alcohol use. Practicing safe sex. Taking low doses of aspirin every day. Taking vitamin and mineral supplements as recommended by your health care provider. What happens during an annual well check? The services and screenings done by your health care provider during your annual well check will depend on your age, overall health, lifestyle risk factors, and family history of  disease. Counseling  Your health care provider may ask you questions about your: Alcohol use. Tobacco use. Drug use. Emotional well-being. Home and relationship well-being. Sexual activity. Eating habits. History of falls. Memory and ability to understand (cognition). Work and work Statistician. Screening  You may have the following tests or measurements: Height, weight, and BMI. Blood pressure. Lipid and cholesterol levels. These may be checked every 5 years, or more frequently if you are over 70 years old. Skin check. Lung cancer screening. You may have this screening every year starting at age 20 if you have a 30-pack-year history of smoking and currently smoke or have quit within the past 15 years. Fecal occult blood test (FOBT) of the stool. You may have this test every year starting at age 70. Flexible sigmoidoscopy or colonoscopy. You may have a sigmoidoscopy every 5 years or a colonoscopy every 10 years starting at age 70. Prostate cancer screening. Recommendations will vary depending on your family history and other risks. Hepatitis C blood test. Hepatitis B blood test. Sexually transmitted disease (STD) testing. Diabetes screening. This is done by checking your blood sugar (glucose) after you have not eaten for a while (fasting). You may have this done every 1-3 years. Abdominal aortic aneurysm (AAA) screening. You may need this if you are a current or former smoker. Osteoporosis. You may be screened starting at age 70 if you are at high risk. Talk with your health care provider about your test results, treatment options,  and if necessary, the need for more tests. Vaccines  Your health care provider may recommend certain vaccines, such as: Influenza vaccine. This is recommended every year. Tetanus, diphtheria, and acellular pertussis (Tdap, Td) vaccine. You may need a Td booster every 10 years. Zoster vaccine. You may need this after age 70. Pneumococcal 13-valent  conjugate (PCV13) vaccine. One dose is recommended after age 70. Pneumococcal polysaccharide (PPSV23) vaccine. One dose is recommended after age 70. Talk to your health care provider about which screenings and vaccines you need and how often you need them. This information is not intended to replace advice given to you by your health care provider. Make sure you discuss any questions you have with your health care provider. Document Released: 02/14/2015 Document Revised: 10/08/2015 Document Reviewed: 11/19/2014 Elsevier Interactive Patient Education  2017 [place] Prevention in the Home Falls can cause injuries. They can happen to people of all ages. There are many things you can do to make your home safe and to help prevent falls. What can I do on the outside of my home? Regularly Kne[place] the edges of walkways and driveways and [place] any cracks. Remove anything that might make you trip as you walk through a door, such as a raised step or threshold. Trim any bushes or trees on the path to your home. Use bright outdoor lighting. Clear any walking paths of anything that might make someone trip, such as rocks or tools. Regularly check to see if handrails are loose or broken. Make sure that both sides of any steps have handrails. Any raised decks and porches should have guardrails on the edges. Have any leaves, snow, or ice cleared regularly. Use sand or salt on walking paths during winter. Clean up any spills in your garage right away. This includes oil or grease spills. What can I do in the bathroom? Use night lights. Install grab bars by the toilet and in the tub and shower. Do not use towel bars as grab bars. Use non-skid mats or decals in the tub or shower. If you need to sit down in the shower, use a plastic, non-slip stool. Keep the floor dry. Clean up any water that spills on the floor as soon as it happens. Remove soap buildup in the tub or shower regularly. Attach bath mats  securely with double-sided non-slip rug tape. Do not have throw rugs and other things on the floor that can make you trip. What can I do in the bedroom? Use night lights. Make sure that you have a light by your bed that is easy to reach. Do not use any sheets or blankets that are too big for your bed. They should not hang down onto the floor. Have a firm chair that has side arms. You can use this for support while you get dressed. Do not have throw rugs and other things on the floor that can make you trip. What can I do in the kitchen? Clean up any spills right away. Avoid walking on wet floors. Keep items that you use a lot in easy-to-reach places. If you need to reach something above you, use a strong step stool that has a grab bar. Keep electrical cords out of the way. Do not use floor polish or wax that makes floors slippery. If you must use wax, use non-skid floor wax. Do not have throw rugs and other things on the floor that can make you trip. What can I do with my stairs? Do not leave  any items on the stairs. Make sure that there are handrails on both sides of the stairs and use them. [place] handrails that are broken or loose. Make sure that handrails are as long as the stairways. Check any carpeting to make sure that it is firmly attached to the stairs. [place] any carpet that is loose or worn. Avoid having throw rugs at the top or bottom of the stairs. If you do have throw rugs, attach them to the floor with carpet tape. Make sure that you have a light switch at the top of the stairs and the bottom of the stairs. If you do not have them, ask someone to add them for you. What else can I do to help prevent falls? Wear shoes that: Do not have high heels. Have rubber bottoms. Are comfortable and fit you well. Are closed at the toe. Do not wear sandals. If you use a stepladder: Make sure that it is fully opened. Do not climb a closed stepladder. Make sure that both sides of the stepladder  are locked into place. Ask someone to hold it for you, if possible. Clearly mark and make sure that you can see: Any grab bars or handrails. First and last steps. Where the edge of each step is. Use tools that help you move around (mobility aids) if they are needed. These include: Canes. Walkers. Scooters. Crutches. Turn on the lights when you go into a dark area. Replace any light bulbs as soon as they burn out. Set up your furniture so you have a clear path. Avoid moving your furniture around. If any of your floors are uneven, [place] them. If there are any pets around you, be aware of where they are. Review your medicines with your doctor. Some medicines can make you feel dizzy. This can increase your chance of falling. Ask your doctor what other things that you can do to help prevent falls. This information is not intended to replace advice given to you by your health care provider. Make sure you discuss any questions you have with your health care provider. Document Released: 11/14/2008 Document Revised: 06/26/2015 Document Reviewed: 02/22/2014 Elsevier Interactive Patient Education  2017 [employer].

## 2022-01-13 NOTE — Progress Notes (Signed)
Subjective:   [name] is a 71 y.o. male who presents for Medicare Annual/Subsequent preventive examination.  I connected with  [name] on 01/13/22 by a video enabled telemedicine application and verified that I am speaking with the correct person using two identifiers.   I discussed the limitations of evaluation and management by telemedicine. The patient expressed understanding and agreed to proceed.  Patient location: home  Provider location: Tele-health-office     Review of Systems     Cardiac Risk Factors include: advanced age (>68mn, >>22women);male gender;hypertension;smoking/ tobacco exposure     Objective:    Today's Vitals   There is no height or weight on file to calculate BMI.     01/13/2022    9:10 AM 01/13/2022    9:01 AM 11/26/2020    8:13 AM 02/11/2020   10:01 AM 12/24/2019    8:51 AM 06/13/2019    8:50 AM  Advanced Directives  Does Patient Have a Medical Advance Directive? No No No No No No  Would patient like information on creating a medical advance directive? No - Patient declined No - Patient declined No - Patient declined   No - Patient declined    Current Medications (verified) Outpatient Encounter Medications as of 01/13/2022  Medication Sig   amLODipine (NORVASC) 10 MG tablet Take 1 tablet (10 mg total) by mouth daily.   dolutegravir-lamiVUDine (DOVATO) 50-300 MG tablet Take 1 tablet by mouth daily.   No facility-administered encounter medications on file as of 01/13/2022.    Allergies (verified) Patient has no known allergies.   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   Syphilis 04/19/2018   Past Surgical History:  Procedure Laterality Date   BIOPSY  02/11/2020   Procedure: BIOPSY;   Surgeon: MIrving Copas, MD;  [facility];  Service: Gastroenterology;;   COLONOSCOPY     COLONOSCOPY WITH PROPOFOL N/A 06/13/2019   Procedure: COLONOSCOPY WITH PROPOFOL;  Surgeon: MIrving Copas, MD;  [place] [facility]  Service: Gastroenterology;  Laterality: N/A;   COLONOSCOPY WITH PROPOFOL N/A 02/11/2020   Procedure: COLONOSCOPY WITH PROPOFOL;  Surgeon: MRush LandmarkGTelford Nab, MD;  [place] [facility]  Service: Gastroenterology;  Laterality: N/A;   ENDOSCOPIC MUCOSAL RESECTION N/A 06/13/2019   Procedure: ENDOSCOPIC MUCOSAL RESECTION;  Surgeon: MRush LandmarkGTelford Nab, MD;  [place]  Service: Gastroenterology;  Laterality: N/A;   HEMOSTASIS CLIP PLACEMENT  06/13/2019   Procedure: HEMOSTASIS CLIP PLACEMENT;  Surgeon: MIrving Copas, MD;  [facility] [facility]  Service: Gastroenterology;;   HEMOSTASIS CONTROL  06/13/2019   Procedure: HEMOSTASIS CONTROL;  Surgeon: MIrving Copas, MD;  [place] [facility]  Service: Gastroenterology;;   IR ANGIOGRAM SELECTIVE EACH ADDITIONAL VESSEL  06/13/2019   IR ANGIOGRAM VISCERAL SELECTIVE  06/13/2019   IR EMBO ART  VEN HEMORR LYMPH EXTRAV  INC GUIDE ROADMAPPING  06/13/2019   IR [place] VASC ACCESS RIGHT  06/13/2019   PARTIAL COLECTOMY     SCLEROTHERAPY  06/13/2019   Procedure: SCLEROTHERAPY;  Surgeon: Mansouraty, GTelford Nab, MD;  [place] [facility]  Service: Gastroenterology;;   SUBMUCOSAL LIFTING INJECTION  06/13/2019   Procedure: SUBMUCOSAL LIFTING INJECTION;  Surgeon: MIrving Copas, MD;  [facility];  Service: Gastroenterology;;   Family History  Problem Relation Age of Onset   Heart failure Mother    Stroke Father    Colon cancer Neg Hx    Esophageal cancer Neg Hx    Stomach cancer Neg Hx    Rectal cancer Neg Hx    Inflammatory bowel disease Neg Hx    Liver disease Neg Hx    Pancreatic cancer Neg Hx    Colon polyps Neg Hx    Social History    Socioeconomic History   Marital status: Single    Spouse name: Not on file   Number of children: 0   Years of education: Not on file   Highest education level: Not on file  Occupational History   Occupation: retired  Tobacco Use   Smoking status: Every Day    Packs/day: 1.00    Years: 44.00    Total pack years: 44.00    Types: Cigarettes    Start date: 08/01/2016   Smokeless tobacco: Never  Vaping Use   Vaping Use: Never used  Substance and Sexual Activity   Alcohol use: Yes    Comment: social rare   Drug use: No   Sexual activity: Yes    Comment: given condoms  Other Topics Concern   Not on file  Social History Narrative   Not on file   Social Determinants of Health   Financial Resource Strain: Low Risk  (01/13/2022)   Overall Financial Resource Strain (CARDIA)    Difficulty of Paying Living Expenses: Not hard at all  Food Insecurity: No Food Insecurity (01/13/2022)   Hunger Vital Sign    Worried About Running Out of Food in the Last Year: Never true    Ran Out of Food in the Last Year: Never true  Transportation Needs: No Transportation Needs (01/13/2022)   PRAPARE - Hydrologist (Medical): No    Lack of Transportation (Non-Medical): No  Physical Activity: Insufficiently Active (01/13/2022)   Exercise Vital Sign    Days of Exercise per Week: 4 days    Minutes of Exercise per Session: 30 min  Stress: No Stress Concern Present (01/13/2022)   [place]    Feeling of Stress : Not at all  Social Connections: Socially Isolated (01/13/2022)   Social Connection and Isolation Panel [NHANES]    Frequency of Communication with Friends and Family: More than three times a week    Frequency of Social Gatherings with Friends and Family: Twice a week    Attends Religious Services: Never    Marine scientist or Organizations: No    Attends Music therapist: Never     Marital Status: Never married    Tobacco Counseling Ready to quit: Not Answered Counseling given: Not Answered   Clinical Intake:  Pre-visit preparation completed: Yes  Pain : No/denies pain     Diabetes: No  How often do you need to have someone help you when you read instructions, pamphlets, or other written materials from your doctor or pharmacy?: 1 - Never  Diabetic?  no  Interpreter Needed?: No  Information entered by :: Leroy Kennedy LPN   Activities of Daily Living    01/13/2022    9:03 AM 01/11/2022    5:52 PM  In your present state of health, do you have any difficulty performing the following activities:  Hearing? 0 0  Vision? 0 0  Difficulty  concentrating or making decisions? 0 0  Walking or climbing stairs? 0 0  Dressing or bathing? 0 0  Doing errands, shopping? 0 0  Preparing Food and eating ? N N  Using the Toilet? N N  In the past six months, have you accidently leaked urine? N N  Do you have problems with loss of bowel control? N N  Managing your Medications? N N  Managing your Finances? N N  Housekeeping or managing your Housekeeping? N N    Patient Care Team: Wendie Agreste, MD as PCP - General (Family Medicine) Tommy Medal, Lavell Islam, MD as PCP - Infectious Diseases (Infectious Diseases) Jonnie Finner, RN (Inactive) as Oncology Nurse Navigator Truitt Merle, MD as Consulting Physician (Hematology) Alla Feeling, NP as Nurse Practitioner (Nurse Practitioner) Mansouraty, Telford Nab., MD as Consulting Physician (Gastroenterology)  Indicate any recent Medical Services you may have received from other than [facility] providers in the past year (date may be approximate).     Assessment:   This is a routine wellness examination for Bobby.  Hearing/Vision screen Hearing Screening - Comments:: No trouble hearing Vision Screening - Comments:: Not up to date [facility]  Dietary issues and exercise activities discussed: Current  Exercise Habits: Home exercise routine, Type of exercise: walking, Time (Minutes): 30, Frequency (Times/Week): 3, Weekly Exercise (Minutes/Week): 90, Intensity: Mild   Goals Addressed             This Visit's Progress    Increase physical activity         Depression Screen    01/13/2022    9:07 AM 11/30/2021    8:00 AM 05/27/2021    8:40 AM 03/17/2021    1:46 PM 11/26/2020    8:13 AM 05/29/2020    8:19 AM 03/27/2020    9:10 AM  PHQ 2/9 Scores  PHQ - 2 Score 0 0 0 0 0 0 0  PHQ- 9 Score 0 0         Fall Risk    01/13/2022    9:01 AM 01/11/2022    5:52 PM 11/30/2021    8:00 AM 05/27/2021    8:40 AM 03/17/2021    1:46 PM  [place] in the past year? 0  0 0 0  Number falls in past yr: 0 0 0 0 0  Injury with Fall? 0 0 0 0 0  Risk for fall due to :   No Fall Risks No Fall Risks No Fall Risks  Follow up Education provided;Falls evaluation completed;Falls prevention discussed  Falls evaluation completed Falls evaluation completed Falls evaluation completed    FALL RISK PREVENTION PERTAINING TO THE HOME:  Any stairs in or around the home? Yes  If so, are there any without handrails? No  Home free of loose throw rugs in walkways, pet beds, electrical cords, etc? Yes  Adequate lighting in your home to reduce risk of falls? Yes   ASSISTIVE DEVICES UTILIZED TO PREVENT FALLS:  Life alert? No  Use of a cane, walker or w/c? No  Grab bars in the bathroom? Yes  Shower chair or bench in shower? No  Elevated toilet seat or a handicapped toilet? No   TIMED UP AND GO:  Was the test performed? No .    Cognitive Function:        01/13/2022    9:04 AM 11/26/2020    8:12 AM  6CIT Screen  What Year? 0 points 0 points  What  month? 0 points 0 points  What time? 0 points 0 points  Count back from 20 0 points 0 points  Months in reverse 0 points 0 points  Repeat phrase 0 points 0 points  Total Score 0 points 0 points    Immunizations Immunization History   Administered Date(s) Administered   DTP 09/01/2009   Fluad Quad(high Dose 65+) 10/23/2018, 10/22/2021   H1N1 06/06/2008   Hepatitis A 07/23/2008, 09/01/2009   Hepatitis B 07/23/2008, 09/01/2009, 10/06/2010   Influenza Split 10/06/2011, 10/02/2012, 10/02/2013   Influenza Whole 11/14/2008, 09/01/2009, 10/06/2010   Influenza, High Dose Seasonal PF 10/15/2017   Influenza,inj,Quad PF,6+ Mos 11/03/2015, 12/16/2016   Influenza-Unspecified 11/17/2019, 10/02/2020, 11/09/2021   [place] SARS COV-2 Pediatric Vaccination 89mo to <622yr10/10/2021   Moderna Sars-Covid-2 Vaccination 03/16/2019, 04/13/2019, 06/17/2019, 05/05/2020, 10/29/2020   PPD Test 06/06/2008   Pneumococcal Conjugate-13 10/23/2018, 11/09/2021   Pneumococcal Polysaccharide-23 06/06/2008, 11/29/2019   Respiratory Syncytial Virus Vaccine,Recomb Aduvanted(Arexvy) 11/09/2021    TDAP status: Due, Education has been provided regarding the importance of this vaccine. Advised may receive this vaccine at local pharmacy or Health Dept. Aware to provide a copy of the vaccination record if obtained from local pharmacy or Health Dept. Verbalized acceptance and understanding.  Flu Vaccine status: Up to date  Pneumococcal vaccine status: Up to date  Covid-19 vaccine status: Completed vaccines  Qualifies for Shingles Vaccine? Yes   Zostavax completed No   Shingrix Completed?: No.    Education has been provided regarding the importance of this vaccine. Patient has been advised to call insurance company to determine out of pocket expense if they have not yet received this vaccine. Advised may also receive vaccine at local pharmacy or Health Dept. Verbalized acceptance and understanding.  Screening Tests Health Maintenance  Topic Date Due   DTaP/Tdap/Td (2 - Tdap) 09/02/2019   COVID-19 Vaccine (6 - 2023-24 season) 01/04/2022   Lung Cancer Screening  01/30/2022 (Originally 02/20/2021)   Zoster Vaccines- Shingrix (1 of 2) 03/02/2022 (Originally  12/05/1969)   COLONOSCOPY (Pts 45-4921yrnsurance coverage will need to be confirmed)  10/07/2022   Medicare Annual Wellness (AWV)  01/14/2023   Pneumonia Vaccine 65+36ears old  Completed   INFLUENZA VACCINE  Completed   Hepatitis C Screening  Completed   HPV VACCINES  Aged Out    Health Maintenance  Health Maintenance Due  Topic Date Due   DTaP/Tdap/Td (2 - Tdap) 09/02/2019   COVID-19 Vaccine (6 - 2023-24 season) 01/04/2022    Colorectal cancer screening: Type of screening: Colonoscopy. Completed 2023. Repeat every 1 years  Lung Cancer Screening: (Low Dose CT Chest recommended if Age 55-30-80ars, 30 pack-year currently smoking OR have quit w/in 15years.) does qualify.   Lung Cancer Screening Referral:   will postponed  Additional Screening:  Hepatitis C Screening: does not qualify; Completed 2017  Vision Screening: Recommended annual ophthalmology exams for early detection of glaucoma and other disorders of the eye. Is the patient up to date with their annual eye exam?  No  Who is the provider or what is the name of the office in which the patient attends annual eye exams? [facility] If pt is not established with a provider, would they like to be referred to a provider to establish care? No .   Dental Screening: Recommended annual dental exams for proper oral hygiene  Community Resource Referral / Chronic Care Management: CRR required this visit?  No   CCM required this visit?  No  Plan:     I have personally reviewed and noted the following in the patient's chart:   Medical and social history Use of alcohol, tobacco or illicit drugs  Current medications and supplements including opioid prescriptions. Patient is not currently taking opioid prescriptions. Functional ability and status Nutritional status Physical activity Advanced directives List of other physicians Hospitalizations, surgeries, and ER visits in previous 12 months Vitals Screenings to  include cognitive, depression, and falls Referrals and appointments  In addition, I have reviewed and discussed with patient certain preventive protocols, quality metrics, and best practice recommendations. A written personalized care plan for preventive services as well as general preventive health recommendations were provided to patient.     Leroy Kennedy, LPN   [id number]   Nurse Notes:

## 2022-01-18 ENCOUNTER — Encounter: Payer: Self-pay | Admitting: Infectious Disease

## 2022-01-18 ENCOUNTER — Ambulatory Visit (INDEPENDENT_AMBULATORY_CARE_PROVIDER_SITE_OTHER): Payer: Medicare Other | Admitting: Infectious Disease

## 2022-01-18 ENCOUNTER — Other Ambulatory Visit: Payer: Self-pay

## 2022-01-18 VITALS — BP 154/85 | HR 72 | Temp 97.5°F | Wt 177.6 lb

## 2022-01-18 DIAGNOSIS — I10 Essential (primary) hypertension: Secondary | ICD-10-CM | POA: Diagnosis not present

## 2022-01-18 DIAGNOSIS — C189 Malignant neoplasm of colon, unspecified: Secondary | ICD-10-CM | POA: Diagnosis not present

## 2022-01-18 DIAGNOSIS — B20 Human immunodeficiency virus [HIV] disease: Secondary | ICD-10-CM | POA: Diagnosis present

## 2022-01-18 DIAGNOSIS — E785 Hyperlipidemia, unspecified: Secondary | ICD-10-CM | POA: Diagnosis not present

## 2022-01-18 HISTORY — DX: Hyperlipidemia, unspecified: E78.5

## 2022-01-18 MED ORDER — ATORVASTATIN CALCIUM 10 MG PO TABS: 10.0000 mg | ORAL_TABLET | Freq: Every day | ORAL | 11 refills | Status: DC

## 2022-01-18 NOTE — Progress Notes (Signed)
Subjective:   Chief complaint: Follow-up for HIV disease on medications   ., Patient ID: [name], male    DOB: 03/16/1950, 71 y.o.   MRN: [id number]  HPI  71 year-old Caucasian male with HIV that has been superbly well controlled on Atripla --> STRIBILD-->  GENVOYA --> Dovato with nice virological suppression   He hadbeen diagnosed with stage I colon cancer and status post resection of cancerous lesions from the colon but without hemicolectomy.   Apparently he was offered a hemicolectomy but decided against this due to lack of sufficient social support.   Been followed closely by Dr. Burr Medico and had no evidence of recurrent disease.  He is here for annual follow-up and without complaitns       Past Medical History:  Diagnosis Date   BPH (benign prostatic hyperplasia)    Colon cancer [facility])    Erectile dysfunction    History of kidney stones    passed stones   HIV infection ([place])    Hypertension    Pruritic rash 04/21/2015   Secondary polycythemia 05/26/2016   Syphilis 04/19/2018    Past Surgical History:  Procedure Laterality Date   BIOPSY  02/11/2020   Procedure: BIOPSY;  Surgeon: Irving Copas., MD;  Location: [facility] ENDOSCOPY;  Service: Gastroenterology;;   COLONOSCOPY     COLONOSCOPY WITH PROPOFOL N/A 06/13/2019   Procedure: COLONOSCOPY WITH PROPOFOL;  Surgeon: Irving Copas., MD;  Location: [place];  Service: Gastroenterology;  Laterality: N/A;   COLONOSCOPY WITH PROPOFOL N/A 02/11/2020   Procedure: COLONOSCOPY WITH PROPOFOL;  Surgeon: Rush Landmark Telford Nab., MD;  Location: [place];  Service: Gastroenterology;  Laterality: N/A;   ENDOSCOPIC MUCOSAL RESECTION N/A 06/13/2019   Procedure: ENDOSCOPIC MUCOSAL RESECTION;  Surgeon: Rush Landmark Telford Nab., MD;  Location: [place];  Service: Gastroenterology;  Laterality: N/A;   HEMOSTASIS CLIP PLACEMENT  06/13/2019   Procedure: HEMOSTASIS CLIP PLACEMENT;  Surgeon: Irving Copas.,  MD;  Location: [place];  Service: Gastroenterology;;   HEMOSTASIS CONTROL  06/13/2019   Procedure: HEMOSTASIS CONTROL;  Surgeon: Irving Copas., MD;  Location: [place];  Service: Gastroenterology;;   IR ANGIOGRAM SELECTIVE EACH ADDITIONAL VESSEL  06/13/2019   IR ANGIOGRAM VISCERAL SELECTIVE  06/13/2019   IR EMBO ART  VEN HEMORR LYMPH EXTRAV  [place]  06/13/2019   IR US GUIDE [place] RIGHT  06/13/2019   PARTIAL COLECTOMY     SCLEROTHERAPY  06/13/2019   Procedure: SCLEROTHERAPY;  Surgeon: Mansouraty, Telford Nab., MD;  Location: [place];  Service: Gastroenterology;;   SUBMUCOSAL LIFTING INJECTION  06/13/2019   Procedure: SUBMUCOSAL LIFTING INJECTION;  Surgeon: Irving Copas., MD;  Location: [facility] ENDOSCOPY;  Service: Gastroenterology;;    Family History  Problem Relation Age of Onset   Heart failure Mother    Stroke Father    Colon cancer Neg Hx    Esophageal cancer Neg Hx    Stomach cancer Neg Hx    Rectal cancer Neg Hx    Inflammatory bowel disease Neg Hx    Liver disease Neg Hx    Pancreatic cancer Neg Hx    Colon polyps Neg Hx       Social History   Socioeconomic History   Marital status: Single    Spouse name: Not on file   Number of children: 0   Years of education: Not on file   Highest education level: Not on file  Occupational History   Occupation: retired  Tobacco Use  Smoking status: Every Day    Packs/day: 1.00    Years: 44.00    Total pack years: 44.00    Types: Cigarettes    Start date: 08/01/2016   Smokeless tobacco: Never  Vaping Use   Vaping Use: Never used  Substance and Sexual Activity   Alcohol use: Yes    Comment: social rare   Drug use: No   Sexual activity: Yes    Comment: given condoms  Other Topics Concern   Not on file  Social History Narrative   Not on file   Social Determinants of Health   Financial Resource Strain: Low Risk  (01/13/2022)   Overall Financial Resource Strain (CARDIA)     Difficulty of Paying Living Expenses: Not hard at all  Food Insecurity: No Food Insecurity (01/13/2022)   Hunger Vital Sign    Worried About Running Out of Food in the Last Year: Never true    Ran Out of Food in the Last Year: Never true  Transportation Needs: No Transportation Needs (01/13/2022)   PRAPARE - Hydrologist (Medical): No    Lack of Transportation (Non-Medical): No  Physical Activity: Insufficiently Active (01/13/2022)   Exercise Vital Sign    Days of Exercise per Week: 4 days    Minutes of Exercise per Session: 30 min  Stress: No Stress Concern Present (01/13/2022)   [place]    Feeling of Stress : Not at all  Social Connections: Socially Isolated (01/13/2022)   Social Connection and Isolation Panel [NHANES]    Frequency of Communication with Friends and Family: More than three times a week    Frequency of Social Gatherings with Friends and Family: Twice a week    Attends Religious Services: Never    Marine scientist or Organizations: No    Attends Music therapist: Never    Marital Status: Never married    No Known Allergies   Current Outpatient Medications:    amLODipine (NORVASC) 10 MG tablet, Take 1 tablet (10 mg total) by mouth daily., Disp: 90 tablet, Rfl: 3   dolutegravir-lamiVUDine (DOVATO) 50-300 MG tablet, Take 1 tablet by mouth daily., Disp: 30 tablet, Rfl: 1    Review of Systems  Constitutional:  Negative for activity change, appetite change, chills, diaphoresis, fatigue, fever and unexpected weight change.  HENT:  Negative for congestion, rhinorrhea, sinus pressure, sneezing, sore throat and trouble swallowing.   Eyes:  Negative for photophobia and visual disturbance.  Respiratory:  Negative for cough, chest tightness, shortness of breath, wheezing and stridor.   Cardiovascular:  Negative for chest pain, palpitations and leg swelling.   Gastrointestinal:  Negative for abdominal distention, abdominal pain, anal bleeding, blood in stool, constipation, diarrhea, nausea and vomiting.  Genitourinary:  Negative for difficulty urinating, dysuria, flank pain and hematuria.  Musculoskeletal:  Negative for arthralgias, back pain, gait problem, joint swelling and myalgias.  Skin:  Negative for color change, pallor, rash and wound.  Neurological:  Negative for dizziness, tremors, weakness and light-headedness.  Hematological:  Negative for adenopathy. Does not bruise/bleed easily.  Psychiatric/Behavioral:  Negative for agitation, behavioral problems, confusion, decreased concentration, dysphoric mood and sleep disturbance.        Objective:   Physical Exam Constitutional:      Appearance: He is well-developed.  HENT:     Head: Normocephalic and atraumatic.  Eyes:     Conjunctiva/sclera: Conjunctivae normal.  Cardiovascular:  Rate and Rhythm: Normal rate and regular rhythm.  Pulmonary:     Effort: Pulmonary effort is normal. No respiratory distress.     Breath sounds: No wheezing.  Abdominal:     General: There is no distension.     Palpations: Abdomen is soft.  Musculoskeletal:        General: No tenderness. Normal range of motion.     Cervical back: Normal range of motion and neck supple.  Skin:    General: Skin is warm and dry.     Coloration: Skin is not pale.     Findings: No erythema or rash.  Neurological:     Mental Status: He is alert and oriented to person, place, and time.  Psychiatric:        Mood and Affect: Mood normal.        Behavior: Behavior normal.        Thought Content: Thought content normal.        Judgment: Judgment normal.        HIV disease:  .I have reviewed [name]'s labs including viral load which was  Lab Results  Component Value Date   HIV1RNAQUANT Not Detected 01/04/2022   and cd4 which was  Lab Results  Component Value Date   CD4TABS 475 01/04/2022     I am  continuing patient's prescription for  Dovato  Hyperlipidemia: reviewed resuls of REPRIEVE and will start atorvastatin  Colon cancer: his CEA ag was up. Colonoscopy did not show overt malignancy in September 2023  Syphilis; serofast  HTN: he wil continue amlodipine

## 2022-01-27 ENCOUNTER — Other Ambulatory Visit: Payer: Self-pay

## 2022-01-27 MED ORDER — DOVATO 50-300 MG PO TABS: 1.0000 | ORAL_TABLET | Freq: Every day | ORAL | 6 refills | Status: DC

## 2022-02-08 ENCOUNTER — Inpatient Hospital Stay: Payer: Medicare Other | Attending: Hematology

## 2022-02-08 DIAGNOSIS — Z85038 Personal history of other malignant neoplasm of large intestine: Secondary | ICD-10-CM | POA: Diagnosis present

## 2022-02-08 DIAGNOSIS — C182 Malignant neoplasm of ascending colon: Secondary | ICD-10-CM

## 2022-02-08 LAB — COMPREHENSIVE METABOLIC PANEL
ALT: 27 U/L (ref 0–44)
AST: 23 U/L (ref 15–41)
Albumin: 4.4 g/dL (ref 3.5–5.0)
Alkaline Phosphatase: 70 U/L (ref 38–126)
Anion gap: 6 (ref 5–15)
BUN: 10 mg/dL (ref 8–23)
CO2: 27 mmol/L (ref 22–32)
Calcium: 9.5 mg/dL (ref 8.9–10.3)
Chloride: 104 mmol/L (ref 98–111)
Creatinine, Ser: 1.28 mg/dL — ABNORMAL HIGH (ref 0.61–1.24)
GFR, Estimated: >60 mL/min (ref >60)
Glucose, Bld: 83 mg/dL (ref 70–99)
Potassium: 4.2 mmol/L (ref 3.5–5.1)
Sodium: 137 mmol/L (ref 135–145)
Total Bilirubin: 0.8 mg/dL (ref 0.3–1.2)
Total Protein: 6.9 g/dL (ref 6.5–8.1)

## 2022-02-08 LAB — CBC WITH DIFFERENTIAL/PLATELET
Abs Immature Granulocytes: 0.02 10*3/uL (ref 0.00–0.07)
Basophils Absolute: 0 10*3/uL (ref 0.0–0.1)
Basophils Relative: 0 %
Eosinophils Absolute: 0.2 10*3/uL (ref 0.0–0.5)
Eosinophils Relative: 2 %
HCT: 52.6 % — ABNORMAL HIGH (ref 39.0–52.0)
Hemoglobin: 18.7 g/dL — ABNORMAL HIGH (ref 13.0–17.0)
Immature Granulocytes: 0 %
Lymphocytes Relative: 32 %
Lymphs Abs: 2.2 10*3/uL (ref 0.7–4.0)
MCH: 34.6 pg — ABNORMAL HIGH (ref 26.0–34.0)
MCHC: 35.6 g/dL (ref 30.0–36.0)
MCV: 97.4 fL (ref 80.0–100.0)
Monocytes Absolute: 0.5 10*3/uL (ref 0.1–1.0)
Monocytes Relative: 8 %
Neutro Abs: 4 10*3/uL (ref 1.7–7.7)
Neutrophils Relative %: 58 %
Platelets: 220 10*3/uL (ref 150–400)
RBC: 5.4 MIL/uL (ref 4.22–5.81)
RDW: 13.1 % (ref 11.5–15.5)
WBC: 7 10*3/uL (ref 4.0–10.5)
nRBC: 0 % (ref 0.0–0.2)

## 2022-02-08 LAB — CEA (IN HOUSE-CHCC): CEA (CHCC-In House): 5.82 ng/mL — ABNORMAL HIGH (ref 0.00–5.00)

## 2022-02-09 NOTE — Assessment & Plan Note (Addendum)
poorly differentiated, pT1NXM0, stage I, MMR normal  -diagnosed in 06/2019 s/p polypectomy and path showed multiple foci of poorly differentiated adenocarcinoma extending into superficial submucosa to a depth of 0.3, when it was resected by polypectomy. CT was negative for metastatic disease.  -Pt declined surgery with hemicolectomy due to lack of support after surgery. He understands he will need close surveillance going forward.  -His last CT scan in Jan 2022, and the last colonoscopy in September 2023, were negative for recurrence. -His tumor marker CEA has been slightly elevated but overall stable in the range of 5-6 in the past 6 months, this is likely nonspecific, probably related to his smoking. -He is clinically doing well, no clinical suspicion for cancer recurrence. -Patient plan to continue follow-up with Dr. Rush Landmark for a new colonoscopy, I will see him as needed.

## 2022-02-09 NOTE — Progress Notes (Unsigned)
[place]   [phone] [phone]   Clinic Follow up Note   Patient Care Team: Wendie Agreste, MD as PCP - General (Family Medicine) Tommy Medal, Lavell Islam, MD as PCP - Infectious Diseases (Infectious Diseases) Jonnie Finner, RN (Inactive) as Oncology Nurse Navigator Truitt Merle, MD as Consulting Physician (Hematology) Alla Feeling, NP as Nurse Practitioner (Nurse Practitioner) Mansouraty, Telford Nab., MD as Consulting Physician (Gastroenterology)  Date of Service:  02/09/2022  I connected with [name] on 02/09/2022 at 11:20 AM EST by {Blank single:[id number]::"video enabled telemedicine visit","telephone visit"} and verified that I am speaking with the correct person using two identifiers.  I discussed the limitations, risks, security and privacy concerns of performing an evaluation and management service by telephone and the availability of in person appointments. I also discussed with the patient that there may be a patient responsible charge related to this service. The patient expressed understanding and agreed to proceed.   Other persons participating in the visit and their role in the encounter:  ***  Patient's location:  *** Provider's location:  ***  CHIEF COMPLAINT: f/u of colon cancer   CURRENT THERAPY: Observation    ASSESSMENT & PLAN: *** [name] is a 72 y.o. male with   ***   ***  No problem-specific Assessment & Plan notes found for this encounter.    SUMMARY OF ONCOLOGIC HISTORY: Oncology History Overview Note  Cancer Staging Cancer of right colon [facility]) Staging form: Colon and Rectum, AJCC 8th Edition - Clinical stage from 06/13/2019: Stage I (cT1, cN0, cM0) - Signed by Truitt Merle, MD on 12/23/2019    Cancer of right colon [facility])  03/13/2019 Procedure   Colonoscopy by Dr Havery Moros IMPRESSION - The examined portion of the ileum was normal. - Polypoid lesion in the cecum overlying the appendiceal orifice as  outlined. Biopsied. - Diverticulosis in the sigmoid colon and in the cecum. - Internal hemorrhoids. - The examination was otherwise normal.     Diagnosis Surgical [P], colon, cecum polyp/polypoid lesion - SUPERFICIAL FRAGMENTS OF TUBULOVILLOUS ADENOMA WITH FOCAL HIGH-GRADE DYSPLASIA - NEGATIVE FOR CARCINOMA   03/28/2019 Imaging   CT AP W contrast  IMPRESSION: 1. 3.5 cm cecal mass, corresponding to the colonoscopy abnormality. No pericolonic extension or adjacent adenopathy. 2. Hepatic morphology which is suspicious for mild cirrhosis. Recannulized paraumbilical vein, suggesting portal venous hypertension. 3.  Aortic Atherosclerosis (ICD10-I70.0). 4. Mild prostatomegaly.   06/13/2019 Cancer Staging   Staging form: Colon and Rectum, AJCC 8th Edition - Clinical stage from 06/13/2019: Stage I (cT1, cN0, cM0) - Signed by Truitt Merle, MD on 12/23/2019   06/13/2019 Procedure   Colonoscopy for polypectomy by Dr Rush Landmark  IMPRESSION - Hemorrhoids found on digital rectal exam. - The examined portion of the ileum was normal. - One 45-50 mm polyp in the cecum, removed with piecemeal mucosal resection. Resected and retrieved. Bleeding was noted during the procedure. Injected with Epinephrine as above. Clips (MR conditional) were placed. Persistent oozing noted. Hemostatic spray applied. - Diverticulosis in the recto-sigmoid colon, in the sigmoid colon, in the descending colon and in the ascending colon. - Non-bleeding non-thrombosed external and internal hemorrhoids   06/13/2019 Initial Biopsy   FINAL MICROSCOPIC DIAGNOSIS:   A. COLON, CECUM, POLYPECTOMY:  - Invasive adenocarcinoma, poorly differentiated, scattered small foci.  - Adenocarcinoma extends into superficial submucosa (depth of 0.3 cm).  - The surgical resection margins are negative for carcinoma.  - See oncology table below.  Mismatch Repair Protein (IHC)   SUMMARY INTERPRETATION: NORMAL    IHC EXPRESSION RESULTS    TEST           RESULT  MLH1:          Preserved nuclear expression  MSH2:          Preserved nuclear expression  MSH6:          Preserved nuclear expression  PMS2:          Preserved nuclear expression    06/19/2019 Imaging   CT Chest  IMPRESSION: 1. No evidence of metastatic disease in the chest. 2. Finely irregular liver surface suggests cirrhosis. 3. Aortic Atherosclerosis (ICD10-I70.0) and Emphysema (ICD10-J43.9).       12/23/2019 Initial Diagnosis   Cancer of right colon ([place])   02/11/2020 Procedure   Colonoscopy by Dr. Rush Landmark  -Hemorrhoids found on digital rectal exam. - The examined portion of the ileum was normal. - Post mucosectomy scar in the cecum with areas of likely clip artifact noted in the cecal base though potential for residual adenoma is present. The cecal base was resected with a cold snare. - Diverticulosis in the recto-sigmoid colon and in the sigmoid colon. - Normal mucosa in the entire examined colon otherwise. - Non-bleeding non-thrombosed external and internal hemorrhoids   02/11/2020 Pathology Results   FINAL MICROSCOPIC DIAGNOSIS:  A. COLON, CECUM BASE, BIOPSY:  - Colonic mucosa with benign lymphoid aggregates.  - No dysplasia, adenomatous change or carcinoma.    02/21/2020 Imaging   CT CAP  IMPRESSION: 1. Persistent soft tissue prominence in the base of the cecum. The possibility of residual tumor should be considered, and correlation with repeat colonoscopy is suggested if clinically appropriate. 2. No definitive findings to suggest metastatic disease in the chest, abdomen or pelvis. 3. Morphologic changes in the liver indicative of underlying cirrhosis. 4. Aortic atherosclerosis. 5. There are calcifications of the aortic valve and mitral valve. Echocardiographic correlation for evaluation of potential valvular dysfunction may be warranted if clinically indicated. 6. Additional incidental findings, as above      INTERVAL HISTORY:  *** [name] was contacted for a follow up of colon cancer . He was last seen by me on 08/10/21.    All other systems were reviewed with the patient and are negative.  MEDICAL HISTORY:  Past Medical History:  Diagnosis Date   BPH (benign prostatic hyperplasia)    Colon cancer [facility])    Erectile dysfunction    History of kidney stones    passed stones   HIV infection ([place])    Hyperlipidemia 01/18/2022   Hypertension    Pruritic rash 04/21/2015   Secondary polycythemia 05/26/2016   Syphilis 04/19/2018    SURGICAL HISTORY: Past Surgical History:  Procedure Laterality Date   BIOPSY  02/11/2020   Procedure: BIOPSY;  Surgeon: Irving Copas., MD;  Location: [facility] ENDOSCOPY;  Service: Gastroenterology;;   COLONOSCOPY     COLONOSCOPY WITH PROPOFOL N/A 06/13/2019   Procedure: COLONOSCOPY WITH PROPOFOL;  Surgeon: Irving Copas., MD;  Location: [place];  Service: Gastroenterology;  Laterality: N/A;   COLONOSCOPY WITH PROPOFOL N/A 02/11/2020   Procedure: COLONOSCOPY WITH PROPOFOL;  Surgeon: Rush Landmark Telford Nab., MD;  Location: [place];  Service: Gastroenterology;  Laterality: N/A;   ENDOSCOPIC MUCOSAL RESECTION N/A 06/13/2019   Procedure: ENDOSCOPIC MUCOSAL RESECTION;  Surgeon: Rush Landmark Telford Nab., MD;  Location: [place];  Service: Gastroenterology;  Laterality: N/A;   HEMOSTASIS CLIP PLACEMENT  06/13/2019   Procedure:  HEMOSTASIS CLIP PLACEMENT;  Surgeon: Mansouraty, Telford Nab., MD;  Location: [place];  Service: Gastroenterology;;   HEMOSTASIS CONTROL  06/13/2019   Procedure: HEMOSTASIS CONTROL;  Surgeon: Irving Copas., MD;  Location: [place];  Service: Gastroenterology;;   IR ANGIOGRAM SELECTIVE EACH ADDITIONAL VESSEL  06/13/2019   IR ANGIOGRAM VISCERAL SELECTIVE  06/13/2019   IR EMBO ART  VEN HEMORR LYMPH EXTRAV  INC GUIDE ROADMAPPING  06/13/2019   IR US GUIDE VASC ACCESS RIGHT  06/13/2019   PARTIAL COLECTOMY     SCLEROTHERAPY  06/13/2019    Procedure: SCLEROTHERAPY;  Surgeon: Mansouraty, Telford Nab., MD;  Location: [place];  Service: Gastroenterology;;   SUBMUCOSAL LIFTING INJECTION  06/13/2019   Procedure: SUBMUCOSAL LIFTING INJECTION;  Surgeon: Irving Copas., MD;  Location: [place];  Service: Gastroenterology;;    I have reviewed the social history and family history with the patient and they are unchanged from previous note.  ALLERGIES:  has No Known Allergies.  MEDICATIONS:  Current Outpatient Medications  Medication Sig Dispense Refill   amLODipine (NORVASC) 10 MG tablet Take 1 tablet (10 mg total) by mouth daily. 90 tablet 3   atorvastatin (LIPITOR) 10 MG tablet Take 1 tablet (10 mg total) by mouth daily. 30 tablet 11   dolutegravir-lamiVUDine (DOVATO) 50-300 MG tablet Take 1 tablet by mouth daily. 30 tablet 6   No current facility-administered medications for this visit.    PHYSICAL EXAMINATION: ECOG PERFORMANCE STATUS: {CHL ONC ECOG [phone]}  There were no vitals filed for this visit. Wt Readings from Last 3 Encounters:  01/18/22 177 lb 9.6 oz (80.6 kg)  11/30/21 172 lb 9.6 oz (78.3 kg)  10/06/21 174 lb (78.9 kg)    *** No vitals taken today, Exam not performed today  LABORATORY DATA:  I have reviewed the data as listed    Latest Ref Rng & Units 02/08/2022    7:30 AM 01/04/2022    9:16 AM 11/10/2021    8:32 AM  CBC  WBC 4.0 - 10.5 K/uL 7.0  7.8  6.8   Hemoglobin 13.0 - 17.0 g/dL 18.7  18.6  18.8   Hematocrit 39.0 - 52.0 % 52.6  52.2  52.7   Platelets 150 - 400 K/uL 220  233  172         Latest Ref Rng & Units 02/08/2022    7:30 AM 01/04/2022    9:16 AM 11/10/2021    8:32 AM  CMP  Glucose 70 - 99 mg/dL 83  94  101   BUN 8 - 23 mg/dL '10  16  11   '$ Creatinine 0.61 - 1.24 mg/dL 1.28  1.23  1.38   Sodium 135 - 145 mmol/L 137  138  135   Potassium 3.5 - 5.1 mmol/L 4.2  5.0  4.7   Chloride 98 - 111 mmol/L 104  104  104   CO2 22 - 32 mmol/L '27  25  27   '$ Calcium 8.9 - 10.3  mg/dL 9.5  9.5  9.5   Total Protein 6.5 - 8.1 g/dL 6.9  7.3  7.6   Total Bilirubin 0.3 - 1.2 mg/dL 0.8  0.9  1.4   Alkaline Phos 38 - 126 U/L 70   70   AST 15 - 41 U/L '23  21  21   '$ ALT 0 - 44 U/L '27  29  29       '$ RADIOGRAPHIC STUDIES: I have personally reviewed the radiological images as listed and agreed with  the findings in the report. No results found.    No orders of the defined types were placed in this encounter.  All questions were answered. The patient knows to call the clinic with any problems, questions or concerns. No barriers to learning was detected. The total time spent in the appointment was {CHL ONC TIME VISIT - [id number]}.     Baldemar Friday, CMA 02/09/2022   Felicity Coyer am acting as scribe for Truitt Merle, MD.   {Add scribe attestation statement}

## 2022-02-10 ENCOUNTER — Inpatient Hospital Stay (HOSPITAL_BASED_OUTPATIENT_CLINIC_OR_DEPARTMENT_OTHER): Payer: Medicare Other | Admitting: Hematology

## 2022-02-10 ENCOUNTER — Encounter: Payer: Self-pay | Admitting: Hematology

## 2022-02-10 DIAGNOSIS — C182 Malignant neoplasm of ascending colon: Secondary | ICD-10-CM | POA: Diagnosis not present

## 2022-02-11 NOTE — Progress Notes (Signed)
Recall has been entered as directed

## 2022-08-24 ENCOUNTER — Other Ambulatory Visit: Payer: Self-pay

## 2022-08-24 DIAGNOSIS — B20 Human immunodeficiency virus [HIV] disease: Secondary | ICD-10-CM

## 2022-08-24 MED ORDER — DOVATO 50-300 MG PO TABS: 1.0000 | ORAL_TABLET | Freq: Every day | ORAL | 5 refills | Status: DC

## 2022-10-19 ENCOUNTER — Encounter: Payer: Self-pay | Admitting: Gastroenterology

## 2022-12-03 ENCOUNTER — Ambulatory Visit (INDEPENDENT_AMBULATORY_CARE_PROVIDER_SITE_OTHER): Payer: Medicare Other | Admitting: Family Medicine

## 2022-12-03 ENCOUNTER — Encounter: Payer: Self-pay | Admitting: Family Medicine

## 2022-12-03 VITALS — BP 138/80 | HR 60 | Temp 97.3°F | Ht 70.0 in | Wt 175.2 lb

## 2022-12-03 DIAGNOSIS — C189 Malignant neoplasm of colon, unspecified: Secondary | ICD-10-CM

## 2022-12-03 DIAGNOSIS — D751 Secondary polycythemia: Secondary | ICD-10-CM

## 2022-12-03 DIAGNOSIS — I10 Essential (primary) hypertension: Secondary | ICD-10-CM

## 2022-12-03 DIAGNOSIS — I700 Atherosclerosis of aorta: Secondary | ICD-10-CM | POA: Diagnosis not present

## 2022-12-03 DIAGNOSIS — Z720 Tobacco use: Secondary | ICD-10-CM

## 2022-12-03 LAB — LIPID PANEL
Cholesterol: 112 mg/dL (ref 0–200)
HDL: 40.7 mg/dL (ref >39.00)
LDL Cholesterol: 54 mg/dL (ref 0–99)
NonHDL: 71.68
Total CHOL/HDL Ratio: 3
Triglycerides: 89 mg/dL (ref 0.0–149.0)
VLDL: 17.8 mg/dL (ref 0.0–40.0)

## 2022-12-03 LAB — COMPREHENSIVE METABOLIC PANEL
ALT: 33 U/L (ref 0–53)
AST: 28 U/L (ref 0–37)
Albumin: 4.3 g/dL (ref 3.5–5.2)
Alkaline Phosphatase: 87 U/L (ref 39–117)
BUN: 12 mg/dL (ref 6–23)
CO2: 27 meq/L (ref 19–32)
Calcium: 9.9 mg/dL (ref 8.4–10.5)
Chloride: 103 meq/L (ref 96–112)
Creatinine, Ser: 1.05 mg/dL (ref 0.40–1.50)
GFR: 71.67 mL/min (ref >60.00)
Glucose, Bld: 102 mg/dL — ABNORMAL HIGH (ref 70–99)
Potassium: 4.7 meq/L (ref 3.5–5.1)
Sodium: 137 meq/L (ref 135–145)
Total Bilirubin: 0.7 mg/dL (ref 0.2–1.2)
Total Protein: 7.6 g/dL (ref 6.0–8.3)

## 2022-12-03 LAB — CBC WITH DIFFERENTIAL/PLATELET
Basophils Absolute: 0 10*3/uL (ref 0.0–0.1)
Basophils Relative: 0.4 % (ref 0.0–3.0)
Eosinophils Absolute: 0.2 10*3/uL (ref 0.0–0.7)
Eosinophils Relative: 2.3 % (ref 0.0–5.0)
HCT: 53.6 % — ABNORMAL HIGH (ref 39.0–52.0)
Hemoglobin: 17.6 g/dL — ABNORMAL HIGH (ref 13.0–17.0)
Lymphocytes Relative: 26.8 % (ref 12.0–46.0)
Lymphs Abs: 2.1 10*3/uL (ref 0.7–4.0)
MCHC: 32.8 g/dL (ref 30.0–36.0)
MCV: 102.2 fL — ABNORMAL HIGH (ref 78.0–100.0)
Monocytes Absolute: 0.6 10*3/uL (ref 0.1–1.0)
Monocytes Relative: 7.2 % (ref 3.0–12.0)
Neutro Abs: 4.9 10*3/uL (ref 1.4–7.7)
Neutrophils Relative %: 63.3 % (ref 43.0–77.0)
Platelets: 245 10*3/uL (ref 150.0–400.0)
RBC: 5.25 Mil/uL (ref 4.22–5.81)
RDW: 13.9 % (ref 11.5–15.5)
WBC: 7.7 10*3/uL (ref 4.0–10.5)

## 2022-12-03 MED ORDER — ATORVASTATIN CALCIUM 10 MG PO TABS: 10.0000 mg | ORAL_TABLET | Freq: Every day | ORAL | 11 refills | Status: AC

## 2022-12-03 MED ORDER — AMLODIPINE BESYLATE 10 MG PO TABS: 10.0000 mg | ORAL_TABLET | Freq: Every day | ORAL | 3 refills | Status: DC

## 2022-12-03 NOTE — Patient Instructions (Signed)
Thanks for coming in today.  No medication changes for now but try to cut back on salt in the diet, see handout and other information below on managing blood pressure.  Keep follow-up with other specialists as planned.  I have placed a referral to care management to see if we can help with arranging a sitter and transportation for colonoscopy.  If any concerns on your labs I will let you know.  Take care!  Health Maintenance After Age 71 After age 63, you are at a higher risk for certain long-term diseases and infections as well as injuries from falls. Falls are a major cause of broken bones and head injuries in people who are older than age 56. Getting regular preventive care can help to keep you healthy and well. Preventive care includes getting regular testing and making lifestyle changes as recommended by your health care provider. Talk with your health care provider about: Which screenings and tests you should have. A screening is a test that checks for a disease when you have no symptoms. A diet and exercise plan that is right for you. What should I know about screenings and tests to prevent falls? Screening and testing are the best ways to find a health problem early. Early diagnosis and treatment give you the best chance of managing medical conditions that are common after age 32. Certain conditions and lifestyle choices may make you more likely to have a fall. Your health care provider may recommend: Regular vision checks. Poor vision and conditions such as cataracts can make you more likely to have a fall. If you wear glasses, make sure to get your prescription updated if your vision changes. Medicine review. Work with your health care provider to regularly review all of the medicines you are taking, including over-the-counter medicines. Ask your health care provider about any side effects that may make you more likely to have a fall. Tell your health care provider if any medicines that you take  make you feel dizzy or sleepy. Strength and balance checks. Your health care provider may recommend certain tests to check your strength and balance while standing, walking, or changing positions. Foot health exam. Foot pain and numbness, as well as not wearing proper footwear, can make you more likely to have a fall. Screenings, including: Osteoporosis screening. Osteoporosis is a condition that causes the bones to get weaker and break more easily. Blood pressure screening. Blood pressure changes and medicines to control blood pressure can make you feel dizzy. Depression screening. You may be more likely to have a fall if you have a fear of falling, feel depressed, or feel unable to do activities that you used to do. Alcohol use screening. Using too much alcohol can affect your balance and may make you more likely to have a fall. Follow these instructions at home: Lifestyle Do not drink alcohol if: Your health care provider tells you not to drink. If you drink alcohol: Limit how much you have to: 0-1 drink a day for women. 0-2 drinks a day for men. Know how much alcohol is in your drink. In the [place] one drink equals one 12 oz bottle of beer (355 mL), one 5 oz glass of wine (148 mL), or one 1 oz glass of hard liquor (44 mL). Do not use any products that contain nicotine or tobacco. These products include cigarettes, chewing tobacco, and vaping devices, such as e-cigarettes. If you need help quitting, ask your health care provider. Activity  Follow a regular  exercise program to stay fit. This will help you maintain your balance. Ask your health care provider what types of exercise are appropriate for you. If you need a cane or walker, use it as recommended by your health care provider. Wear supportive shoes that have nonskid soles. Safety  Remove any tripping hazards, such as rugs, cords, and clutter. Install safety equipment such as grab bars in bathrooms and safety rails on stairs. Keep  rooms and walkways well-lit. General instructions Talk with your health care provider about your risks for falling. Tell your health care provider if: You fall. Be sure to tell your health care provider about all falls, even ones that seem minor. You feel dizzy, tiredness (fatigue), or off-balance. Take over-the-counter and prescription medicines only as told by your health care provider. These include supplements. Eat a healthy diet and maintain a healthy weight. A healthy diet includes low-fat dairy products, low-fat (lean) meats, and fiber from whole grains, beans, and lots of fruits and vegetables. Stay current with your vaccines. Schedule regular health, dental, and eye exams. Summary Having a healthy lifestyle and getting preventive care can help to protect your health and wellness after age 36. Screening and testing are the best way to find a health problem early and help you avoid having a fall. Early diagnosis and treatment give you the best chance for managing medical conditions that are more common for people who are older than age 46. Falls are a major cause of broken bones and head injuries in people who are older than age 2. Take precautions to prevent a fall at home. Work with your health care provider to learn what changes you can make to improve your health and wellness and to prevent falls. This information is not intended to replace advice given to you by your health care provider. Make sure you discuss any questions you have with your health care provider. Document Revised: 06/09/2020 Document Reviewed: 06/09/2020 Elsevier Patient Education  2024 [employer]   Managing Your Hypertension Hypertension, also called high blood pressure, is when the force of the blood pressing against the walls of the arteries is too strong. Arteries are blood vessels that carry blood from your heart throughout your body. Hypertension forces the heart to work harder to pump blood and may cause  the arteries to become narrow or stiff. Understanding blood pressure readings A blood pressure reading includes a higher number over a lower number: The first, or top, number is called the systolic pressure. It is a measure of the pressure in your arteries as your heart beats. The second, or bottom number, is called the diastolic pressure. It is a measure of the pressure in your arteries as the heart relaxes. For most people, a normal blood pressure is below 120/80. Your personal target blood pressure may vary depending on your medical conditions, your age, and other factors. Blood pressure is classified into four stages. Based on your blood pressure reading, your health care provider may use the following stages to determine what type of treatment you need, if any. Systolic pressure and diastolic pressure are measured in a unit called millimeters of mercury (mmHg). Normal Systolic pressure: below 120. Diastolic pressure: below 80. Elevated Systolic pressure: 120-129. Diastolic pressure: below 80. Hypertension stage 1 Systolic pressure: 130-139. Diastolic pressure: 80-89. Hypertension stage 2 Systolic pressure: 140 or above. Diastolic pressure: 90 or above. How can this condition affect me? Managing your hypertension is very important. Over time, hypertension can damage the arteries and decrease  blood flow to parts of the body, including the brain, heart, and kidneys. Having untreated or uncontrolled hypertension can lead to: A heart attack. A stroke. A weakened blood vessel (aneurysm). Heart failure. Kidney damage. Eye damage. Memory and concentration problems. Vascular dementia. What actions can I take to manage this condition? Hypertension can be managed by making lifestyle changes and possibly by taking medicines. Your health care provider will help you make a plan to bring your blood pressure within a normal range. You may be referred for counseling on a healthy diet and physical  activity. Nutrition  Eat a diet that is high in fiber and potassium, and low in salt (sodium), added sugar, and fat. An example eating plan is called the DASH diet. DASH stands for Dietary Approaches to Stop Hypertension. To eat this way: Eat plenty of fresh fruits and vegetables. Try to fill one-half of your plate at each meal with fruits and vegetables. Eat whole grains, such as whole-wheat pasta, brown rice, or whole-grain bread. Fill about one-fourth of your plate with whole grains. Eat low-fat dairy products. Avoid fatty cuts of meat, processed or cured meats, and poultry with skin. Fill about one-fourth of your plate with lean proteins such as fish, chicken without skin, beans, eggs, and tofu. Avoid pre-made and processed foods. These tend to be higher in sodium, added sugar, and fat. Reduce your daily sodium intake. Many people with hypertension should eat less than 1,500 mg of sodium a day. Lifestyle  Work with your health care provider to maintain a healthy body weight or to lose weight. Ask what an ideal weight is for you. Get at least 30 minutes of exercise that causes your heart to beat faster (aerobic exercise) most days of the week. Activities may include walking, swimming, or biking. Include exercise to strengthen your muscles (resistance exercise), such as weight lifting, as part of your weekly exercise routine. Try to do these types of exercises for 30 minutes at least 3 days a week. Do not use any products that contain nicotine or tobacco. These products include cigarettes, chewing tobacco, and vaping devices, such as e-cigarettes. If you need help quitting, ask your health care provider. Control any long-term (chronic) conditions you have, such as high cholesterol or diabetes. Identify your sources of stress and find ways to manage stress. This may include meditation, deep breathing, or making time for fun activities. Alcohol use Do not drink alcohol if: Your health care  provider tells you not to drink. You are pregnant, may be pregnant, or are planning to become pregnant. If you drink alcohol: Limit how much you have to: 0-1 drink a day for women. 0-2 drinks a day for men. Know how much alcohol is in your drink. In the [place] one drink equals one 12 oz bottle of beer (355 mL), one 5 oz glass of wine (148 mL), or one 1 oz glass of hard liquor (44 mL). Medicines Your health care provider may prescribe medicine if lifestyle changes are not enough to get your blood pressure under control and if: Your systolic blood pressure is 130 or higher. Your diastolic blood pressure is 80 or higher. Take medicines only as told by your health care provider. Follow the directions carefully. Blood pressure medicines must be taken as told by your health care provider. The medicine does not work as well when you skip doses. Skipping doses also puts you at risk for problems. Monitoring Before you monitor your blood pressure: Do not smoke, drink caffeinated beverages,  or exercise within 30 minutes before taking a measurement. Use the bathroom and empty your bladder (urinate). Sit quietly for at least 5 minutes before taking measurements. Monitor your blood pressure at home as told by your health care provider. To do this: Sit with your back straight and supported. Place your feet flat on the floor. Do not cross your legs. Support your arm on a flat surface, such as a table. Make sure your upper arm is at heart level. Each time you measure, take two or three readings one minute apart and record the results. You may also need to have your blood pressure checked regularly by your health care provider. General information Talk with your health care provider about your diet, exercise habits, and other lifestyle factors that may be contributing to hypertension. Review all the medicines you take with your health care provider because there may be side effects or interactions. Keep all  follow-up visits. Your health care provider can help you create and adjust your plan for managing your high blood pressure. Where to find more information National Heart, Lung, and Blood Institute: [employer] American Heart Association: www.heart.org Contact a health care provider if: You think you are having a reaction to medicines you have taken. You have repeated (recurrent) headaches. You feel dizzy. You have swelling in your ankles. You have trouble with your vision. Get help right away if: You develop a severe headache or confusion. You have unusual weakness or numbness, or you feel faint. You have severe pain in your chest or abdomen. You vomit repeatedly. You have trouble breathing. These symptoms may be an emergency. Get help right away. Call 911. Do not wait to see if the symptoms will go away. Do not drive yourself to the hospital. Summary Hypertension is when the force of blood pumping through your arteries is too strong. If this condition is not controlled, it may put you at risk for serious complications. Your personal target blood pressure may vary depending on your medical conditions, your age, and other factors. For most people, a normal blood pressure is less than 120/80. Hypertension is managed by lifestyle changes, medicines, or both. Lifestyle changes to help manage hypertension include losing weight, eating a healthy, low-sodium diet, exercising more, stopping smoking, and limiting alcohol. This information is not intended to replace advice given to you by your health care provider. Make sure you discuss any questions you have with your health care provider. Document Revised: 10/02/2020 Document Reviewed: 10/02/2020 Elsevier Patient Education  2024 [employer].

## 2022-12-03 NOTE — Progress Notes (Signed)
Subjective:  Patient ID: [name], male    DOB: February 25, 1950  Age: 72 y.o. MRN: [id number]  CC:  Chief Complaint  Patient presents with   Follow-up    HPI [name] presents for med follow up.   No health changes. Still hiking, birding group.   PCP, me Infectious disease, Dr. Daiva Eves, HIV, treated with Dovato, appt 12/10.  Gastroenterology, Dr. Meridee Score.  Oncology, Dr.Feng, cancer right colon.  Diagnosed May 2021 status post polypectomy.  CT negative for metastatic disease and declined surgery with hemicolectomy due to lack of sporadic surgery.  On surveillance.  CT January 2022 and colonoscopy September 2023 negative for recurrence.  Slightly elevated CEA, nonspecific probably related to smoking per appointment January 10 with oncology.  Follow-up as needed.  Repeat colonoscopy was due in September. Has not yet scheduled. Nobody available to be present for that procedure - had to pay someone to be there during procedure.   Hypertension: Last visit with me in October 2023.  Treated with amlodipine 10 mg daily.  Denies any new side effects with medications. Some salt added to food.  Home readings: 130/70's.  BP Readings from Last 3 Encounters:  12/03/22 138/80  01/18/22 (!) 154/85  11/30/21 128/78   Lab Results  Component Value Date   CREATININE 1.28 (H) 02/08/2022   Secondary polycythemia Followed by hematology/oncology as above.  Blood donation or therapeutic phlebotomy has been discussed previously.  Risk of elevated hemoglobin have been discussed. Lab Results  Component Value Date   WBC 7.0 02/08/2022   HGB 18.7 (H) 02/08/2022   HCT 52.6 (H) 02/08/2022   MCV 97.4 02/08/2022   PLT 220 02/08/2022     Hyperlipidemia: With aortic atherosclerosis on imaging.  Calcifications of aortic and mitral valves noted on previous CT imaging, cardiology referral discussed but declined.  Meds were declined to last October, appears he is now on Lipitor 10 mg daily. No se/myalgias.   No Cp/dyspnea. Lab Results  Component Value Date   CHOL 137 01/04/2022   HDL 40 01/04/2022   LDLCALC 81 01/04/2022   TRIG 84 01/04/2022   CHOLHDL 3.4 01/04/2022   Lab Results  Component Value Date   ALT 27 02/08/2022   AST 23 02/08/2022   ALKPHOS 70 02/08/2022   BILITOT 0.8 02/08/2022        12/03/2022    8:11 AM 01/13/2022    9:07 AM 11/30/2021    8:00 AM 05/27/2021    8:40 AM 03/17/2021    1:46 PM  Depression screen PHQ 2/9  Decreased Interest 0 0 0 0 0  Down, Depressed, Hopeless 0 0 0 0 0  PHQ - 2 Score 0 0 0 0 0  Altered sleeping 0 0 0    Tired, decreased energy 0 0 0    Change in appetite 0 0 0    Feeling bad or failure about yourself  0 0 0    Trouble concentrating 0 0 0    Moving slowly or fidgety/restless 0 0 0    Suicidal thoughts 0 0 0    PHQ-9 Score 0 0 0    Difficult doing work/chores Not difficult at all        Health Maintenance  Topic Date Due   DTaP/Tdap/Td (2 - Tdap) 09/02/2019   Lung Cancer Screening  02/20/2021   Medicare Annual Wellness (AWV)  01/14/2023   Colonoscopy  01/02/2023 (Originally 10/07/2022)   Pneumonia Vaccine 81+ Years old  Completed  INFLUENZA VACCINE  Completed   Hepatitis C Screening  Completed   HPV VACCINES  Aged Out   COVID-19 Vaccine  Discontinued   Zoster Vaccines- Shingrix  Discontinued  Colonoscopy, as above, was due in September with history of colon CA. Prostate: does not have family history of prostate cancer The natural history of prostate cancer and ongoing controversy regarding screening and potential treatment outcomes of prostate cancer has been discussed with the patient. The meaning of a false positive PSA and a false negative PSA has been discussed. He indicates understanding of the limitations of this screening test and wishes NOT  to proceed with screening PSA testing. Lab Results  Component Value Date   PSA 0.25 Test Methodology: [employer] PSA 05/15/2008      Immunization History  Administered  Date(s) Administered   DTP 09/01/2009   Fluad Quad(high Dose 65+) 10/23/2018, 10/22/2021   H1N1 06/06/2008   Hepatitis A 07/23/2008, 09/01/2009   Hepatitis B 07/23/2008, 09/01/2009, 10/06/2010   Influenza Split 10/06/2011, 10/02/2012, 10/02/2013   Influenza Whole 11/14/2008, 09/01/2009, 10/06/2010   Influenza, High Dose Seasonal PF 10/15/2017, 10/25/2022   Influenza,inj,Quad PF,6+ Mos 11/03/2015, 12/16/2016   Influenza-Unspecified 11/17/2019, 10/02/2020, 11/09/2021   MODERNA SARS COV-2 Pediatric Vaccination 6mos to <12yrs 11/09/2021   Moderna Sars-Covid-2 Vaccination 03/16/2019, 04/13/2019, 06/17/2019, 05/05/2020, 10/29/2020   PPD Test 06/06/2008   Pneumococcal Conjugate-13 10/23/2018, 11/09/2021   Pneumococcal Polysaccharide-23 06/06/2008, 11/29/2019   Respiratory Syncytial Virus Vaccine,Recomb Aduvanted(Arexvy) 11/09/2021  Flu vaccine and COVID booster 9/23 at [facility].   No results found. Wears one contact. No recent optho visit - recommended. No change in vision.   Dental: recent visit.   Alcohol: rare, none in 6 months.   Tobacco: 1 and 1/2 ppd cigarettes. Declines plan to quit at this time.   Exercise: walking daily.    History Patient Active Problem List   Diagnosis Date Noted   Hyperlipidemia 01/18/2022   Cancer of right colon (HCC) 12/23/2019   Acute upper GI bleed 06/13/2019   Cecal polyp 05/26/2019   History of colonic polyps 05/26/2019   Abnormal CT scan, colon 05/26/2019   Syphilis 04/19/2018   Secondary polycythemia 05/26/2016   Pruritic rash 04/21/2015   Erectile dysfunction 03/02/2012   Allergic conjunctivitis 03/02/2012   HTN (hypertension) 10/06/2010   Smoking 10/06/2010   BPH (benign prostatic hyperplasia) 10/06/2010   COLONIC POLYPS 10/17/2008   CYSTS OF ORAL SOFT TISSUES 10/17/2008   DIVERTICULITIS OF COLON 10/17/2008   Human immunodeficiency virus (HIV) disease (HCC) 06/06/2008   Pancytopenia (HCC) 06/06/2008   LUNG NODULE 06/06/2008   RENAL  CALCULUS 06/06/2008   Past Medical History:  Diagnosis Date   BPH (benign prostatic hyperplasia)    Colon cancer (HCC)    Erectile dysfunction    History of kidney stones    passed stones   HIV infection (HCC)    Hyperlipidemia 01/18/2022   Hypertension    Pruritic rash 04/21/2015   Secondary polycythemia 05/26/2016   Syphilis 04/19/2018   Past Surgical History:  Procedure Laterality Date   BIOPSY  02/11/2020   Procedure: BIOPSY;  Surgeon: Lemar Lofty., MD;  Location: [facility] ENDOSCOPY;  Service: Gastroenterology;;   COLONOSCOPY     COLONOSCOPY WITH PROPOFOL N/A 06/13/2019   Procedure: COLONOSCOPY WITH PROPOFOL;  Surgeon: Lemar Lofty., MD;  Location: [facility] ENDOSCOPY;  Service: Gastroenterology;  Laterality: N/A;   COLONOSCOPY WITH PROPOFOL N/A 02/11/2020   Procedure: COLONOSCOPY WITH PROPOFOL;  Surgeon: Meridee Score Netty Starring., MD;  Location: [facility] ENDOSCOPY;  Service:  Gastroenterology;  Laterality: N/A;   ENDOSCOPIC MUCOSAL RESECTION N/A 06/13/2019   Procedure: ENDOSCOPIC MUCOSAL RESECTION;  Surgeon: Meridee Score Netty Starring., MD;  Location: [facility] ENDOSCOPY;  Service: Gastroenterology;  Laterality: N/A;   HEMOSTASIS CLIP PLACEMENT  06/13/2019   Procedure: HEMOSTASIS CLIP PLACEMENT;  Surgeon: Lemar Lofty., MD;  Location: [facility] ENDOSCOPY;  Service: Gastroenterology;;   HEMOSTASIS CONTROL  06/13/2019   Procedure: HEMOSTASIS CONTROL;  Surgeon: Lemar Lofty., MD;  Location: [facility] ENDOSCOPY;  Service: Gastroenterology;;   IR ANGIOGRAM SELECTIVE EACH ADDITIONAL VESSEL  06/13/2019   IR ANGIOGRAM VISCERAL SELECTIVE  06/13/2019   IR EMBO ART  VEN HEMORR LYMPH EXTRAV  INC GUIDE ROADMAPPING  06/13/2019   IR US GUIDE VASC ACCESS RIGHT  06/13/2019   PARTIAL COLECTOMY     SCLEROTHERAPY  06/13/2019   Procedure: SCLEROTHERAPY;  Surgeon: Mansouraty, Netty Starring., MD;  Location: [facility] ENDOSCOPY;  Service: Gastroenterology;;   SUBMUCOSAL LIFTING INJECTION  06/13/2019   Procedure: SUBMUCOSAL  LIFTING INJECTION;  Surgeon: Lemar Lofty., MD;  Location: [facility] ENDOSCOPY;  Service: Gastroenterology;;   No Known Allergies Prior to Admission medications   Medication Sig Start Date End Date Taking? Authorizing Provider  amLODipine (NORVASC) 10 MG tablet Take 1 tablet (10 mg total) by mouth daily. 11/30/21  Yes Shade Flood, MD  atorvastatin (LIPITOR) 10 MG tablet Take 1 tablet (10 mg total) by mouth daily. 01/18/22  Yes Daiva Eves, Lisette Grinder, MD  dolutegravir-lamiVUDine (DOVATO) 50-300 MG tablet Take 1 tablet by mouth daily. 08/24/22  Yes Daiva Eves, Lisette Grinder, MD   Social History   Socioeconomic History   Marital status: Single    Spouse name: Not on file   Number of children: 0   Years of education: Not on file   Highest education level: Not on file  Occupational History   Occupation: retired  Tobacco Use   Smoking status: Every Day    Current packs/day: 1.00    Average packs/day: 1 pack/day for 44.0 years (44.0 ttl pk-yrs)    Types: Cigarettes    Start date: 08/01/2016   Smokeless tobacco: Never  Vaping Use   Vaping status: Never Used  Substance and Sexual Activity   Alcohol use: Yes    Comment: social rare   Drug use: No   Sexual activity: Yes    Comment: given condoms  Other Topics Concern   Not on file  Social History Narrative   Not on file   Social Determinants of Health   Financial Resource Strain: Low Risk  (01/13/2022)   Overall Financial Resource Strain (CARDIA)    Difficulty of Paying Living Expenses: Not hard at all  Food Insecurity: No Food Insecurity (01/13/2022)   Hunger Vital Sign    Worried About Running Out of Food in the Last Year: Never true    Ran Out of Food in the Last Year: Never true  Transportation Needs: No Transportation Needs (01/13/2022)   PRAPARE - Administrator, Civil Service (Medical): No    Lack of Transportation (Non-Medical): No  Physical Activity: Insufficiently Active (01/13/2022)   Exercise Vital Sign     Days of Exercise per Week: 4 days    Minutes of Exercise per Session: 30 min  Stress: No Stress Concern Present (01/13/2022)   [employer] of Occupational Health - Occupational Stress Questionnaire    Feeling of Stress : Not at all  Social Connections: Socially Isolated (01/13/2022)   Social Connection and Isolation Panel [NHANES]    Frequency  of Communication with Friends and Family: More than three times a week    Frequency of Social Gatherings with Friends and Family: Twice a week    Attends Religious Services: Never    Database administrator or Organizations: No    Attends Banker Meetings: Never    Marital Status: Never married  Intimate Partner Violence: Not At Risk (01/13/2022)   Humiliation, Afraid, Rape, and Kick questionnaire    Fear of Current or Ex-Partner: No    Emotionally Abused: No    Physically Abused: No    Sexually Abused: No    Review of Systems   Objective:   Vitals:   12/03/22 0806 12/03/22 0836  BP: (!) 140/78 138/80  Pulse: 60   Temp: (!) 97.3 F (36.3 C)   TempSrc: Temporal   SpO2: 95%   Weight: 175 lb 3.2 oz (79.5 kg)   Height: 5\' 10"  (1.778 m)      Physical Exam     Assessment & Plan:  [name] is a 72 y.o. male . Essential hypertension - Plan: Comprehensive metabolic panel, amLODipine (NORVASC) 10 MG tablet  -  Stable, tolerating current regimen. Medications refilled. Labs pending as above.   Adenocarcinoma, colon (HCC) - Plan: AMB Referral [facility] Care Management  -Due for colonoscopy, will refer to care management to see if sitter, transportation options can be provided to help coordinate that study.  Atherosclerosis of aorta (HCC) - Plan: Comprehensive metabolic panel, Lipid panel, atorvastatin (LIPITOR) 10 MG tablet  -  Stable, tolerating current regimen. Asymptomatic. Medications refilled. Labs pending as above.   Secondary polycythemia - Plan: CBC with Differential/Platelet  - check CBC, option of  therapeutic phlebotomy depending on hemoglobin.  Potential risks of elevated RBC/hemoglobin discussed.  Tobacco abuse  -Declines cessation at this time.  Advised to let me know if he is ready for cessation and I am happy to help.  Health risks discussed with continued smoking.   Meds ordered this encounter  Medications   amLODipine (NORVASC) 10 MG tablet    Sig: Take 1 tablet (10 mg total) by mouth daily.    Dispense:  90 tablet    Refill:  3   atorvastatin (LIPITOR) 10 MG tablet    Sig: Take 1 tablet (10 mg total) by mouth daily.    Dispense:  30 tablet    Refill:  11   Patient Instructions  Thanks for coming in today.  No medication changes for now but try to cut back on salt in the diet, see handout and other information below on managing blood pressure.  Keep follow-up with other specialists as planned.  I have placed a referral to care management to see if we can help with arranging a sitter and transportation for colonoscopy.  If any concerns on your labs I will let you know.  Take care!  Health Maintenance After Age 25 After age 47, you are at a higher risk for certain long-term diseases and infections as well as injuries from falls. Falls are a major cause of broken bones and head injuries in people who are older than age 76. Getting regular preventive care can help to keep you healthy and well. Preventive care includes getting regular testing and making lifestyle changes as recommended by your health care provider. Talk with your health care provider about: Which screenings and tests you should have. A screening is a test that checks for a disease when you have no symptoms. A diet and  exercise plan that is right for you. What should I know about screenings and tests to prevent falls? Screening and testing are the best ways to find a health problem early. Early diagnosis and treatment give you the best chance of managing medical conditions that are common after age 51. Certain  conditions and lifestyle choices may make you more likely to have a fall. Your health care provider may recommend: Regular vision checks. Poor vision and conditions such as cataracts can make you more likely to have a fall. If you wear glasses, make sure to get your prescription updated if your vision changes. Medicine review. Work with your health care provider to regularly review all of the medicines you are taking, including over-the-counter medicines. Ask your health care provider about any side effects that may make you more likely to have a fall. Tell your health care provider if any medicines that you take make you feel dizzy or sleepy. Strength and balance checks. Your health care provider may recommend certain tests to check your strength and balance while standing, walking, or changing positions. Foot health exam. Foot pain and numbness, as well as not wearing proper footwear, can make you more likely to have a fall. Screenings, including: Osteoporosis screening. Osteoporosis is a condition that causes the bones to get weaker and break more easily. Blood pressure screening. Blood pressure changes and medicines to control blood pressure can make you feel dizzy. Depression screening. You may be more likely to have a fall if you have a fear of falling, feel depressed, or feel unable to do activities that you used to do. Alcohol use screening. Using too much alcohol can affect your balance and may make you more likely to have a fall. Follow these instructions at home: Lifestyle Do not drink alcohol if: Your health care provider tells you not to drink. If you drink alcohol: Limit how much you have to: 0-1 drink a day for women. 0-2 drinks a day for men. Know how much alcohol is in your drink. In the [place] one drink equals one 12 oz bottle of beer (355 mL), one 5 oz glass of wine (148 mL), or one 1 oz glass of hard liquor (44 mL). Do not use any products that contain nicotine or tobacco.  These products include cigarettes, chewing tobacco, and vaping devices, such as e-cigarettes. If you need help quitting, ask your health care provider. Activity  Follow a regular exercise program to stay fit. This will help you maintain your balance. Ask your health care provider what types of exercise are appropriate for you. If you need a cane or walker, use it as recommended by your health care provider. Wear supportive shoes that have nonskid soles. Safety  Remove any tripping hazards, such as rugs, cords, and clutter. Install safety equipment such as grab bars in bathrooms and safety rails on stairs. Keep rooms and walkways well-lit. General instructions Talk with your health care provider about your risks for falling. Tell your health care provider if: You fall. Be sure to tell your health care provider about all falls, even ones that seem minor. You feel dizzy, tiredness (fatigue), or off-balance. Take over-the-counter and prescription medicines only as told by your health care provider. These include supplements. Eat a healthy diet and maintain a healthy weight. A healthy diet includes low-fat dairy products, low-fat (lean) meats, and fiber from whole grains, beans, and lots of fruits and vegetables. Stay current with your vaccines. Schedule regular health, dental, and eye exams. Summary  Having a healthy lifestyle and getting preventive care can help to protect your health and wellness after age 14. Screening and testing are the best way to find a health problem early and help you avoid having a fall. Early diagnosis and treatment give you the best chance for managing medical conditions that are more common for people who are older than age 76. Falls are a major cause of broken bones and head injuries in people who are older than age 60. Take precautions to prevent a fall at home. Work with your health care provider to learn what changes you can make to improve your health and wellness  and to prevent falls. This information is not intended to replace advice given to you by your health care provider. Make sure you discuss any questions you have with your health care provider. Document Revised: 06/09/2020 Document Reviewed: 06/09/2020 Elsevier Patient Education  2024 Elsevier Inc.   Managing Your Hypertension Hypertension, also called high blood pressure, is when the force of the blood pressing against the walls of the arteries is too strong. Arteries are blood vessels that carry blood from your heart throughout your body. Hypertension forces the heart to work harder to pump blood and may cause the arteries to become narrow or stiff. Understanding blood pressure readings A blood pressure reading includes a higher number over a lower number: The first, or top, number is called the systolic pressure. It is a measure of the pressure in your arteries as your heart beats. The second, or bottom number, is called the diastolic pressure. It is a measure of the pressure in your arteries as the heart relaxes. For most people, a normal blood pressure is below 120/80. Your personal target blood pressure may vary depending on your medical conditions, your age, and other factors. Blood pressure is classified into four stages. Based on your blood pressure reading, your health care provider may use the following stages to determine what type of treatment you need, if any. Systolic pressure and diastolic pressure are measured in a unit called millimeters of mercury (mmHg). Normal Systolic pressure: below 120. Diastolic pressure: below 80. Elevated Systolic pressure: 120-129. Diastolic pressure: below 80. Hypertension stage 1 Systolic pressure: 130-139. Diastolic pressure: 80-89. Hypertension stage 2 Systolic pressure: 140 or above. Diastolic pressure: 90 or above. How can this condition affect me? Managing your hypertension is very important. Over time, hypertension can damage the  arteries and decrease blood flow to parts of the body, including the brain, heart, and kidneys. Having untreated or uncontrolled hypertension can lead to: A heart attack. A stroke. A weakened blood vessel (aneurysm). Heart failure. Kidney damage. Eye damage. Memory and concentration problems. Vascular dementia. What actions can I take to manage this condition? Hypertension can be managed by making lifestyle changes and possibly by taking medicines. Your health care provider will help you make a plan to bring your blood pressure within a normal range. You may be referred for counseling on a healthy diet and physical activity. Nutrition  Eat a diet that is high in fiber and potassium, and low in salt (sodium), added sugar, and fat. An example eating plan is called the DASH diet. DASH stands for Dietary Approaches to Stop Hypertension. To eat this way: Eat plenty of fresh fruits and vegetables. Try to fill one-half of your plate at each meal with fruits and vegetables. Eat whole grains, such as whole-wheat pasta, brown rice, or whole-grain bread. Fill about one-fourth of your plate with whole grains. Eat low-fat  dairy products. Avoid fatty cuts of meat, processed or cured meats, and poultry with skin. Fill about one-fourth of your plate with lean proteins such as fish, chicken without skin, beans, eggs, and tofu. Avoid pre-made and processed foods. These tend to be higher in sodium, added sugar, and fat. Reduce your daily sodium intake. Many people with hypertension should eat less than 1,500 mg of sodium a day. Lifestyle  Work with your health care provider to maintain a healthy body weight or to lose weight. Ask what an ideal weight is for you. Get at least 30 minutes of exercise that causes your heart to beat faster (aerobic exercise) most days of the week. Activities may include walking, swimming, or biking. Include exercise to strengthen your muscles (resistance exercise), such as weight  lifting, as part of your weekly exercise routine. Try to do these types of exercises for 30 minutes at least 3 days a week. Do not use any products that contain nicotine or tobacco. These products include cigarettes, chewing tobacco, and vaping devices, such as e-cigarettes. If you need help quitting, ask your health care provider. Control any long-term (chronic) conditions you have, such as high cholesterol or diabetes. Identify your sources of stress and find ways to manage stress. This may include meditation, deep breathing, or making time for fun activities. Alcohol use Do not drink alcohol if: Your health care provider tells you not to drink. You are pregnant, may be pregnant, or are planning to become pregnant. If you drink alcohol: Limit how much you have to: 0-1 drink a day for women. 0-2 drinks a day for men. Know how much alcohol is in your drink. In the [place] one drink equals one 12 oz bottle of beer (355 mL), one 5 oz glass of wine (148 mL), or one 1 oz glass of hard liquor (44 mL). Medicines Your health care provider may prescribe medicine if lifestyle changes are not enough to get your blood pressure under control and if: Your systolic blood pressure is 130 or higher. Your diastolic blood pressure is 80 or higher. Take medicines only as told by your health care provider. Follow the directions carefully. Blood pressure medicines must be taken as told by your health care provider. The medicine does not work as well when you skip doses. Skipping doses also puts you at risk for problems. Monitoring Before you monitor your blood pressure: Do not smoke, drink caffeinated beverages, or exercise within 30 minutes before taking a measurement. Use the bathroom and empty your bladder (urinate). Sit quietly for at least 5 minutes before taking measurements. Monitor your blood pressure at home as told by your health care provider. To do this: Sit with your back straight and  supported. Place your feet flat on the floor. Do not cross your legs. Support your arm on a flat surface, such as a table. Make sure your upper arm is at heart level. Each time you measure, take two or three readings one minute apart and record the results. You may also need to have your blood pressure checked regularly by your health care provider. General information Talk with your health care provider about your diet, exercise habits, and other lifestyle factors that may be contributing to hypertension. Review all the medicines you take with your health care provider because there may be side effects or interactions. Keep all follow-up visits. Your health care provider can help you create and adjust your plan for managing your high blood pressure. Where to find more information  National Heart, Lung, and Blood Institute: [employer] [facility] Association: www.heart.org Contact a health care provider if: You think you are having a reaction to medicines you have taken. You have repeated (recurrent) headaches. You feel dizzy. You have swelling in your ankles. You have trouble with your vision. Get help right away if: You develop a severe headache or confusion. You have unusual weakness or numbness, or you feel faint. You have severe pain in your chest or abdomen. You vomit repeatedly. You have trouble breathing. These symptoms may be an emergency. Get help right away. Call 911. Do not wait to see if the symptoms will go away. Do not drive yourself to the hospital. Summary Hypertension is when the force of blood pumping through your arteries is too strong. If this condition is not controlled, it may put you at risk for serious complications. Your personal target blood pressure may vary depending on your medical conditions, your age, and other factors. For most people, a normal blood pressure is less than 120/80. Hypertension is managed by lifestyle changes, medicines, or  both. Lifestyle changes to help manage hypertension include losing weight, eating a healthy, low-sodium diet, exercising more, stopping smoking, and limiting alcohol. This information is not intended to replace advice given to you by your health care provider. Make sure you discuss any questions you have with your health care provider. Document Revised: 10/02/2020 Document Reviewed: 10/02/2020 Elsevier Patient Education  2024 Elsevier Inc.     Signed,   Meredith Staggers, MD [facility], [facility] 12/03/22 8:38 AM

## 2022-12-07 ENCOUNTER — Telehealth: Payer: Self-pay | Admitting: *Deleted

## 2022-12-07 NOTE — Telephone Encounter (Signed)
   Telephone encounter was:  Unsuccessful.  12/07/2022 Name: [name] MRN: [id number] DOB: 1950/09/20  Unsuccessful outbound call made today to assist with:  Transportation Needs   Outreach Attempt:  1st Attempt  A HIPAA compliant voice message was left requesting a return call.  Instructed patient to call back at [phone].  [facility]  Population Health Careguide  Direct Dial: [phone] Website: [email or web address]

## 2022-12-13 ENCOUNTER — Telehealth: Payer: Self-pay | Admitting: *Deleted

## 2022-12-13 NOTE — Telephone Encounter (Signed)
   Telephone encounter was:  Successful.  12/13/2022 Name: [name] MRN: [id number] DOB: Feb 14, 1950  [name] is a 72 y.o. year old male who is a primary care patient of Neva Seat, Asencion Partridge, MD . The community resource team was consulted for assistance with Transportation Needs  Patient provided information on Senior wheels and also consented to a referal being placed to see if they could have any suggestions or help the patient Care guide performed the following interventions: Patient provided with information about care guide support team and interviewed to confirm resource needs. [place] [facility] referral  Follow Up Plan:  No further follow up planned at this time. The patient has been provided with needed resources. [facility]  Population Health Careguide  Direct Dial: [phone] Website: [email or web address]

## 2022-12-28 ENCOUNTER — Other Ambulatory Visit: Payer: Self-pay

## 2022-12-28 ENCOUNTER — Other Ambulatory Visit: Payer: Medicare Other

## 2022-12-28 ENCOUNTER — Other Ambulatory Visit (HOSPITAL_COMMUNITY)
Admission: RE | Admit: 2022-12-28 | Discharge: 2022-12-28 | Disposition: A | Discharge status: Home / Self Care | Payer: Medicare Other | Source: Ambulatory Visit | Attending: Infectious Disease | Admitting: Infectious Disease

## 2022-12-28 DIAGNOSIS — B20 Human immunodeficiency virus [HIV] disease: Secondary | ICD-10-CM | POA: Diagnosis present

## 2022-12-28 DIAGNOSIS — E785 Hyperlipidemia, unspecified: Secondary | ICD-10-CM | POA: Insufficient documentation

## 2022-12-29 LAB — URINE CYTOLOGY ANCILLARY ONLY
Chlamydia: NEGATIVE
Comment: NEGATIVE
Comment: NORMAL
Neisseria Gonorrhea: NEGATIVE

## 2022-12-29 LAB — T-HELPER CELL (CD4) - (RCID CLINIC ONLY)
CD4 % Helper T Cell: 26 % — ABNORMAL LOW (ref 33–65)
CD4 T Cell Abs: 444 /uL (ref 400–1790)

## 2022-12-31 LAB — COMPLETE METABOLIC PANEL WITH GFR
AG Ratio: 1.4 (calc) (ref 1.0–2.5)
ALT: 24 U/L (ref 9–46)
AST: 19 U/L (ref 10–35)
Albumin: 4.1 g/dL (ref 3.6–5.1)
Alkaline phosphatase (APISO): 85 U/L (ref 35–144)
BUN: 9 mg/dL (ref 7–25)
CO2: 25 mmol/L (ref 20–32)
Calcium: 9.5 mg/dL (ref 8.6–10.3)
Chloride: 104 mmol/L (ref 98–110)
Creat: 1.07 mg/dL (ref 0.70–1.28)
Globulin: 2.9 g/dL (ref 1.9–3.7)
Glucose, Bld: 97 mg/dL (ref 65–99)
Potassium: 4.5 mmol/L (ref 3.5–5.3)
Sodium: 139 mmol/L (ref 135–146)
Total Bilirubin: 0.6 mg/dL (ref 0.2–1.2)
Total Protein: 7 g/dL (ref 6.1–8.1)
eGFR: 74 mL/min/{1.73_m2} (ref >=60)

## 2022-12-31 LAB — CBC WITH DIFFERENTIAL/PLATELET
Absolute Lymphocytes: 1917 {cells}/uL (ref 850–3900)
Absolute Monocytes: 554 {cells}/uL (ref 200–950)
Basophils Absolute: 21 {cells}/uL (ref 0–200)
Basophils Relative: 0.3 %
Eosinophils Absolute: 128 {cells}/uL (ref 15–500)
Eosinophils Relative: 1.8 %
HCT: 50.5 % — ABNORMAL HIGH (ref 38.5–50.0)
Hemoglobin: 17.5 g/dL — ABNORMAL HIGH (ref 13.2–17.1)
MCH: 33.5 pg — ABNORMAL HIGH (ref 27.0–33.0)
MCHC: 34.7 g/dL (ref 32.0–36.0)
MCV: 96.6 fL (ref 80.0–100.0)
MPV: 10.7 fL (ref 7.5–12.5)
Monocytes Relative: 7.8 %
Neutro Abs: 4480 {cells}/uL (ref 1500–7800)
Neutrophils Relative %: 63.1 %
Platelets: 216 10*3/uL (ref 140–400)
RBC: 5.23 10*6/uL (ref 4.20–5.80)
RDW: 12.8 % (ref 11.0–15.0)
Total Lymphocyte: 27 %
WBC: 7.1 10*3/uL (ref 3.8–10.8)

## 2022-12-31 LAB — RPR: RPR Ser Ql: REACTIVE — AB

## 2022-12-31 LAB — RPR TITER: RPR Titer: 1:4 {titer} — ABNORMAL HIGH

## 2022-12-31 LAB — T PALLIDUM AB: T Pallidum Abs: POSITIVE — AB

## 2022-12-31 LAB — HIV-1 RNA QUANT-NO REFLEX-BLD
HIV 1 RNA Quant: NOT DETECTED {copies}/mL
HIV-1 RNA Quant, Log: NOT DETECTED {Log_copies}/mL

## 2023-01-10 NOTE — Progress Notes (Unsigned)
Subjective:    Chief complaint: follow-up for HIV disease on meds   Patient ID: [name], male    DOB: 1950-04-18, 72 y.o.   MRN: [id number]  HPI  Discussed the use of AI scribe software for clinical note transcription with the patient, who gave verbal consent to proceed.  History of Present Illness   The patient, with a history of HIV and cancer, presents for a routine follow-up. He reports that he is due for a repeat colonoscopy, but has been having difficulty arranging for someone to accompany him during the procedure. He has been in contact with a service similar to Me[employer] Wheels, but the service is unable to provide a companion for the duration of the procedure. The patient's family resides in [place], making it difficult for him to assist.  The patient has been vaccinated for both the flu and COVID-19 at a local [facility]. He is currently on Dovato for HIV and atorvastatin to reduce inflammation and manage cholesterol levels. He has been monitoring his lab results online and noted that his white blood cell count is normal. However, he has been living with an elevated hemoglobin and hematocrit, a condition known as polycythemia, for several years. The patient admits to smoking, which is a known cause of polycythemia.       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   Syphilis 04/19/2018    Past Surgical History:  Procedure Laterality Date   BIOPSY  02/11/2020   Procedure: BIOPSY;  Surgeon: Lemar Lofty., MD;  Location: [facility] ENDOSCOPY;  Service: Gastroenterology;;   COLONOSCOPY     COLONOSCOPY WITH PROPOFOL N/A 06/13/2019   Procedure: COLONOSCOPY WITH PROPOFOL;  Surgeon: Lemar Lofty., MD;  Location:  [facility] ENDOSCOPY;  Service: Gastroenterology;  Laterality: N/A;   COLONOSCOPY WITH PROPOFOL N/A 02/11/2020   Procedure: COLONOSCOPY WITH PROPOFOL;  Surgeon: Meridee Score Netty Starring., MD;  Location: [facility] ENDOSCOPY;  Service: Gastroenterology;  Laterality: N/A;   ENDOSCOPIC MUCOSAL RESECTION N/A 06/13/2019   Procedure: ENDOSCOPIC MUCOSAL RESECTION;  Surgeon: Meridee Score Netty Starring., MD;  Location: [facility] ENDOSCOPY;  Service: Gastroenterology;  Laterality: N/A;   HEMOSTASIS CLIP PLACEMENT  06/13/2019   Procedure: HEMOSTASIS CLIP PLACEMENT;  Surgeon: Lemar Lofty., MD;  Location: [facility] ENDOSCOPY;  Service: Gastroenterology;;   HEMOSTASIS CONTROL  06/13/2019   Procedure: HEMOSTASIS CONTROL;  Surgeon: Lemar Lofty., MD;  Location: [facility] ENDOSCOPY;  Service: Gastroenterology;;   IR ANGIOGRAM SELECTIVE EACH ADDITIONAL VESSEL  06/13/2019   IR ANGIOGRAM VISCERAL SELECTIVE  06/13/2019   IR EMBO ART  VEN HEMORR LYMPH EXTRAV  INC GUIDE ROADMAPPING  06/13/2019   IR US GUIDE VASC ACCESS RIGHT  06/13/2019   PARTIAL COLECTOMY     SCLEROTHERAPY  06/13/2019   Procedure: SCLEROTHERAPY;  Surgeon: Mansouraty, Netty Starring., MD;  Location: [facility] ENDOSCOPY;  Service: Gastroenterology;;   SUBMUCOSAL LIFTING INJECTION  06/13/2019   Procedure: SUBMUCOSAL LIFTING INJECTION;  Surgeon: Lemar Lofty., MD;  Location: [facility] ENDOSCOPY;  Service: Gastroenterology;;    Family History  Problem Relation Age of Onset   Heart failure Mother    Stroke Father    Colon cancer Neg Hx    Esophageal cancer Neg Hx  Stomach cancer Neg Hx    Rectal cancer Neg Hx    Inflammatory bowel disease Neg Hx    Liver disease Neg Hx    Pancreatic cancer Neg Hx    Colon polyps Neg Hx       Social History   Socioeconomic History   Marital status: Single    Spouse name: Not on file   Number of children: 0   Years of education: Not on file   Highest education level: Not on file  Occupational History   Occupation: retired  Tobacco Use    Smoking status: Every Day    Current packs/day: 1.00    Average packs/day: 1 pack/day for 44.0 years (44.0 ttl pk-yrs)    Types: Cigarettes    Start date: 08/01/2016   Smokeless tobacco: Never  Vaping Use   Vaping status: Never Used  Substance and Sexual Activity   Alcohol use: Yes    Comment: social rare   Drug use: No   Sexual activity: Yes    Comment: given condoms  Other Topics Concern   Not on file  Social History Narrative   Not on file   Social Determinants of Health   Financial Resource Strain: Low Risk  (01/13/2022)   Overall Financial Resource Strain (CARDIA)    Difficulty of Paying Living Expenses: Not hard at all  Food Insecurity: No Food Insecurity (01/13/2022)   Hunger Vital Sign    Worried About Running Out of Food in the Last Year: Never true    Ran Out of Food in the Last Year: Never true  Transportation Needs: No Transportation Needs (01/13/2022)   PRAPARE - Administrator, Civil Service (Medical): No    Lack of Transportation (Non-Medical): No  Physical Activity: Insufficiently Active (01/13/2022)   Exercise Vital Sign    Days of Exercise per Week: 4 days    Minutes of Exercise per Session: 30 min  Stress: No Stress Concern Present (01/13/2022)   [employer] of Occupational Health - Occupational Stress Questionnaire    Feeling of Stress : Not at all  Social Connections: Socially Isolated (01/13/2022)   Social Connection and Isolation Panel [NHANES]    Frequency of Communication with Friends and Family: More than three times a week    Frequency of Social Gatherings with Friends and Family: Twice a week    Attends Religious Services: Never    Database administrator or Organizations: No    Attends Engineer, structural: Never    Marital Status: Never married    No Known Allergies   Current Outpatient Medications:    amLODipine (NORVASC) 10 MG tablet, Take 1 tablet (10 mg total) by mouth daily., Disp: 90 tablet, Rfl: 3    atorvastatin (LIPITOR) 10 MG tablet, Take 1 tablet (10 mg total) by mouth daily., Disp: 30 tablet, Rfl: 11   dolutegravir-lamiVUDine (DOVATO) 50-300 MG tablet, Take 1 tablet by mouth daily., Disp: 30 tablet, Rfl: 5    Review of Systems  Constitutional:  Negative for activity change, appetite change, chills, diaphoresis, fatigue, fever and unexpected weight change.  HENT:  Negative for congestion, rhinorrhea, sinus pressure, sneezing, sore throat and trouble swallowing.   Eyes:  Negative for photophobia and visual disturbance.  Respiratory:  Negative for cough, chest tightness, shortness of breath, wheezing and stridor.   Cardiovascular:  Negative for chest pain, palpitations and leg swelling.  Gastrointestinal:  Negative for abdominal distention, abdominal pain, anal bleeding, blood in stool, constipation, diarrhea, nausea  and vomiting.  Genitourinary:  Negative for difficulty urinating, dysuria, flank pain and hematuria.  Musculoskeletal:  Negative for arthralgias, back pain, gait problem, joint swelling and myalgias.  Skin:  Negative for color change, pallor, rash and wound.  Neurological:  Negative for dizziness, tremors, weakness and light-headedness.  Hematological:  Negative for adenopathy. Does not bruise/bleed easily.  Psychiatric/Behavioral:  Negative for agitation, behavioral problems, confusion, decreased concentration, dysphoric mood and sleep disturbance.        Objective:   Physical Exam Constitutional:      Appearance: He is well-developed.  HENT:     Head: Normocephalic and atraumatic.  Eyes:     Conjunctiva/sclera: Conjunctivae normal.  Cardiovascular:     Rate and Rhythm: Normal rate and regular rhythm.  Pulmonary:     Effort: Pulmonary effort is normal. No respiratory distress.     Breath sounds: No wheezing.  Abdominal:     General: There is no distension.     Palpations: Abdomen is soft.  Musculoskeletal:        General: No tenderness. Normal range of  motion.     Cervical back: Normal range of motion and neck supple.  Skin:    General: Skin is warm and dry.     Coloration: Skin is not pale.     Findings: No erythema or rash.  Neurological:     General: No focal deficit present.     Mental Status: He is alert and oriented to person, place, and time.  Psychiatric:        Mood and Affect: Mood normal.        Behavior: Behavior normal.        Thought Content: Thought content normal.        Judgment: Judgment normal.           Assessment & Plan:   Assessment and Plan    HIV Viral load undetectable and CD4 count healthy at 444. Adherence to Dovato therapy. -Continue Dovato as prescribed. -Order labs for next visit.  Polycythemia Chronic, likely secondary to smoking. Discussed the risks associated with smoking and the benefits of quitting or switching to vaping. -Encouraged smoking cessation or transition to vaping to improve oxygenation and reduce red blood cell production.  Hyperlipidemia On atorvastatin for cholesterol management and reduction of inflammation. -Continue atorvastatin as prescribed.  Cancer surveillance Elevated CEA antigen, but stable. Colonoscopy pending due to logistical issues with arranging transportation and accompaniment. -Encouraged patient to arrange for colonoscopy as soon as possible.  Immunizations Received flu and COVID-19 vaccines at [facility]. -Continue to stay up-to-date with recommended immunizations.  Follow-up Due to funding requirements, patient to be seen every 10 months. -Schedule next appointment for 10 months from now.

## 2023-01-11 ENCOUNTER — Ambulatory Visit (INDEPENDENT_AMBULATORY_CARE_PROVIDER_SITE_OTHER): Payer: Medicare Other | Admitting: Infectious Disease

## 2023-01-11 ENCOUNTER — Other Ambulatory Visit: Payer: Self-pay

## 2023-01-11 ENCOUNTER — Encounter: Payer: Self-pay | Admitting: Infectious Disease

## 2023-01-11 VITALS — BP 135/84 | HR 76 | Temp 97.1°F | Ht 70.0 in | Wt 173.0 lb

## 2023-01-11 DIAGNOSIS — E785 Hyperlipidemia, unspecified: Secondary | ICD-10-CM

## 2023-01-11 DIAGNOSIS — B20 Human immunodeficiency virus [HIV] disease: Secondary | ICD-10-CM

## 2023-01-11 DIAGNOSIS — A539 Syphilis, unspecified: Secondary | ICD-10-CM | POA: Diagnosis not present

## 2023-01-11 DIAGNOSIS — I10 Essential (primary) hypertension: Secondary | ICD-10-CM | POA: Diagnosis not present

## 2023-01-11 DIAGNOSIS — C182 Malignant neoplasm of ascending colon: Secondary | ICD-10-CM

## 2023-01-11 MED ORDER — DOVATO 50-300 MG PO TABS: 1.0000 | ORAL_TABLET | Freq: Every day | ORAL | 11 refills | Status: DC

## 2023-01-18 ENCOUNTER — Ambulatory Visit (INDEPENDENT_AMBULATORY_CARE_PROVIDER_SITE_OTHER): Payer: Medicare Other | Admitting: *Deleted

## 2023-01-18 DIAGNOSIS — Z0000 Encounter for general adult medical examination without abnormal findings: Secondary | ICD-10-CM

## 2023-01-18 NOTE — Patient Instructions (Signed)
[name] , Thank you for taking time to come for your Medicare Wellness Visit. I appreciate your ongoing commitment to your health goals. Please review the following plan we discussed and let me know if I can assist you in the future.   Screening recommendations/referrals: Colonoscopy: Education provided Recommended yearly ophthalmology/optometry visit for glaucoma screening and checkup Recommended yearly dental visit for hygiene and checkup  Vaccinations: Influenza vaccine: up to date Pneumococcal vaccine: up to date Tdap vaccine: Education provided Shingles vaccine: Education provided    Advanced directives: Education provided  Preventive Care 71 Years and Older, Male Preventive care refers to lifestyle choices and visits with your health care provider that can promote health and wellness. What does preventive care include? A yearly physical exam. This is also called an annual well check. Dental exams once or twice a year. Routine eye exams. Ask your health care provider how often you should have your eyes checked. Personal lifestyle choices, including: Daily care of your teeth and gums. Regular physical activity. Eating a healthy diet. Avoiding tobacco and drug use. Limiting alcohol use. Practicing safe sex. Taking low doses of aspirin every day. Taking vitamin and mineral supplements as recommended by your health care provider. What happens during an annual well check? The services and screenings done by your health care provider during your annual well check will depend on your age, overall health, lifestyle risk factors, and family history of disease. Counseling  Your health care provider may ask you questions about your: Alcohol use. Tobacco use. Drug use. Emotional well-being. Home and relationship well-being. Sexual activity. Eating habits. History of falls. Memory and ability to understand (cognition). Work and work Astronomer. Screening  You may have the  following tests or measurements: Height, weight, and BMI. Blood pressure. Lipid and cholesterol levels. These may be checked every 5 years, or more frequently if you are over 13 years old. Skin check. Lung cancer screening. You may have this screening every year starting at age 71 if you have a 30-pack-year history of smoking and currently smoke or have quit within the past 15 years. Fecal occult blood test (FOBT) of the stool. You may have this test every year starting at age 71. Flexible sigmoidoscopy or colonoscopy. You may have a sigmoidoscopy every 5 years or a colonoscopy every 10 years starting at age 71. Prostate cancer screening. Recommendations will vary depending on your family history and other risks. Hepatitis C blood test. Hepatitis B blood test. Sexually transmitted disease (STD) testing. Diabetes screening. This is done by checking your blood sugar (glucose) after you have not eaten for a while (fasting). You may have this done every 1-3 years. Abdominal aortic aneurysm (AAA) screening. You may need this if you are a current or former smoker. Osteoporosis. You may be screened starting at age 71 if you are at high risk. Talk with your health care provider about your test results, treatment options, and if necessary, the need for more tests. Vaccines  Your health care provider may recommend certain vaccines, such as: Influenza vaccine. This is recommended every year. Tetanus, diphtheria, and acellular pertussis (Tdap, Td) vaccine. You may need a Td booster every 10 years. Zoster vaccine. You may need this after age 71. Pneumococcal 13-valent conjugate (PCV13) vaccine. One dose is recommended after age 71. Pneumococcal polysaccharide (PPSV23) vaccine. One dose is recommended after age 71. Talk to your health care provider about which screenings and vaccines you need and how often you need them. This information is not intended  to replace advice given to you by your health care  provider. Make sure you discuss any questions you have with your health care provider. Document Released: 02/14/2015 Document Revised: 10/08/2015 Document Reviewed: 11/19/2014 Elsevier Interactive Patient Education  2017 [employer].  Fall Prevention in the Home Falls can cause injuries. They can happen to people of all ages. There are many things you can do to make your home safe and to help prevent falls. What can I do on the outside of my home? Regularly [place] the edges of walkways and driveways and [place] any cracks. Remove anything that might make you trip as you walk through a door, such as a raised step or threshold. Trim any bushes or trees on the path to your home. Use bright outdoor lighting. Clear any walking paths of anything that might make someone trip, such as rocks or tools. Regularly check to see if handrails are loose or broken. Make sure that both sides of any steps have handrails. Any raised decks and porches should have guardrails on the edges. Have any leaves, snow, or ice cleared regularly. Use sand or salt on walking paths during winter. Clean up any spills in your garage right away. This includes oil or grease spills. What can I do in the bathroom? Use night lights. Install grab bars by the toilet and in the tub and shower. Do not use towel bars as grab bars. Use non-skid mats or decals in the tub or shower. If you need to sit down in the shower, use a plastic, non-slip stool. Keep the floor dry. Clean up any water that spills on the floor as soon as it happens. Remove soap buildup in the tub or shower regularly. Attach bath mats securely with double-sided non-slip rug tape. Do not have throw rugs and other things on the floor that can make you trip. What can I do in the bedroom? Use night lights. Make sure that you have a light by your bed that is easy to reach. Do not use any sheets or blankets that are too big for your bed. They should not hang down onto the  floor. Have a firm chair that has side arms. You can use this for support while you get dressed. Do not have throw rugs and other things on the floor that can make you trip. What can I do in the kitchen? Clean up any spills right away. Avoid walking on wet floors. Keep items that you use a lot in easy-to-reach places. If you need to reach something above you, use a strong step stool that has a grab bar. Keep electrical cords out of the way. Do not use floor polish or wax that makes floors slippery. If you must use wax, use non-skid floor wax. Do not have throw rugs and other things on the floor that can make you trip. What can I do with my stairs? Do not leave any items on the stairs. Make sure that there are handrails on both sides of the stairs and use them. Wands handrails that are broken or loose. Make sure that handrails are as long as the stairways. Check any carpeting to make sure that it is firmly attached to the stairs. [place] any carpet that is loose or worn. Avoid having throw rugs at the top or bottom of the stairs. If you do have throw rugs, attach them to the floor with carpet tape. Make sure that you have a light switch at the top of the stairs and  the bottom of the stairs. If you do not have them, ask someone to add them for you. What else can I do to help prevent falls? Wear shoes that: Do not have high heels. Have rubber bottoms. Are comfortable and fit you well. Are closed at the toe. Do not wear sandals. If you use a stepladder: Make sure that it is fully opened. Do not climb a closed stepladder. Make sure that both sides of the stepladder are locked into place. Ask someone to hold it for you, if possible. Clearly mark and make sure that you can see: Any grab bars or handrails. First and last steps. Where the edge of each step is. Use tools that help you move around (mobility aids) if they are needed. These include: Canes. Walkers. Scooters. Crutches. Turn on the  lights when you go into a dark area. Replace any light bulbs as soon as they burn out. Set up your furniture so you have a clear path. Avoid moving your furniture around. If any of your floors are uneven, [place] them. If there are any pets around you, be aware of where they are. Review your medicines with your doctor. Some medicines can make you feel dizzy. This can increase your chance of falling. Ask your doctor what other things that you can do to help prevent falls. This information is not intended to replace advice given to you by your health care provider. Make sure you discuss any questions you have with your health care provider. Document Released: 11/14/2008 Document Revised: 06/26/2015 Document Reviewed: 02/22/2014 Elsevier Interactive Patient Education  2017 [employer].

## 2023-01-18 NOTE — Progress Notes (Signed)
Subjective:   [name] is a 72 y.o. male who presents for Medicare Annual/Subsequent preventive examination.  Visit Complete: Virtual I connected with  [name] on 01/18/23 by a audio enabled telemedicine application and verified that I am speaking with the correct person using two identifiers.  Patient Location: Home  Provider Location: Home Office  I discussed the limitations of evaluation and management by telemedicine. The patient expressed understanding and agreed to proceed.  Vital Signs: Because this visit was a virtual/telehealth visit, some criteria may be missing or patient reported. Any vitals not documented were not able to be obtained and vitals that have been documented are patient reported.  Patient Medicare AWV questionnaire was completed by the patient on 01-17-2023; I have confirmed that all information answered by patient is correct and no changes since this date.  Cardiac Risk Factors include: advanced age (>50men, >22 women);male gender;hypertension     Objective:    There were no vitals filed for this visit. There is no height or weight on file to calculate BMI.     01/18/2023    8:12 AM 01/13/2022    9:10 AM 01/13/2022    9:01 AM 11/26/2020    8:13 AM 02/11/2020   10:01 AM 12/24/2019    8:51 AM 06/13/2019    8:50 AM  Advanced Directives  Does Patient Have a Medical Advance Directive? No No No No No No No  Would patient like information on creating a medical advance directive? No - Patient declined No - Patient declined No - Patient declined No - Patient declined   No - Patient declined    Current Medications (verified) Outpatient Encounter Medications as of 01/18/2023  Medication Sig   amLODipine (NORVASC) 10 MG tablet Take 1 tablet (10 mg total) by mouth daily.   atorvastatin (LIPITOR) 10 MG tablet Take 1 tablet (10 mg total) by mouth daily.   dolutegravir-lamiVUDine (DOVATO) 50-300 MG tablet Take 1 tablet by mouth daily.   No  facility-administered encounter medications on file as of 01/18/2023.    Allergies (verified) Patient has no known allergies.   History: Past Medical History:  Diagnosis Date   BPH (benign prostatic hyperplasia)    Colon cancer (HCC)    Erectile dysfunction    History of kidney stones    passed stones   HIV infection (HCC)    Hyperlipidemia 01/18/2022   Hypertension    Pruritic rash 04/21/2015   Secondary polycythemia 05/26/2016   Syphilis 04/19/2018   Past Surgical History:  Procedure Laterality Date   BIOPSY  02/11/2020   Procedure: BIOPSY;  Surgeon: Lemar Lofty., MD;  Location: [facility] ENDOSCOPY;  Service: Gastroenterology;;   COLONOSCOPY     COLONOSCOPY WITH PROPOFOL N/A 06/13/2019   Procedure: COLONOSCOPY WITH PROPOFOL;  Surgeon: Lemar Lofty., MD;  Location: [facility] ENDOSCOPY;  Service: Gastroenterology;  Laterality: N/A;   COLONOSCOPY WITH PROPOFOL N/A 02/11/2020   Procedure: COLONOSCOPY WITH PROPOFOL;  Surgeon: Meridee Score Netty Starring., MD;  Location: [facility] ENDOSCOPY;  Service: Gastroenterology;  Laterality: N/A;   ENDOSCOPIC MUCOSAL RESECTION N/A 06/13/2019   Procedure: ENDOSCOPIC MUCOSAL RESECTION;  Surgeon: Meridee Score Netty Starring., MD;  Location: [facility] ENDOSCOPY;  Service: Gastroenterology;  Laterality: N/A;   HEMOSTASIS CLIP PLACEMENT  06/13/2019   Procedure: HEMOSTASIS CLIP PLACEMENT;  Surgeon: Lemar Lofty., MD;  Location: [facility] ENDOSCOPY;  Service: Gastroenterology;;   HEMOSTASIS CONTROL  06/13/2019   Procedure: HEMOSTASIS CONTROL;  Surgeon: Lemar Lofty., MD;  Location: [facility] ENDOSCOPY;  Service: Gastroenterology;;  IR ANGIOGRAM SELECTIVE EACH ADDITIONAL VESSEL  06/13/2019   IR ANGIOGRAM VISCERAL SELECTIVE  06/13/2019   IR EMBO ART  VEN HEMORR LYMPH EXTRAV  INC GUIDE ROADMAPPING  06/13/2019   IR US GUIDE VASC ACCESS RIGHT  06/13/2019   PARTIAL COLECTOMY     SCLEROTHERAPY  06/13/2019   Procedure: SCLEROTHERAPY;  Surgeon: Mansouraty, Netty Starring., MD;   Location: [facility] ENDOSCOPY;  Service: Gastroenterology;;   SUBMUCOSAL LIFTING INJECTION  06/13/2019   Procedure: SUBMUCOSAL LIFTING INJECTION;  Surgeon: Lemar Lofty., MD;  Location: [facility] ENDOSCOPY;  Service: Gastroenterology;;   Family History  Problem Relation Age of Onset   Heart failure Mother    Stroke Father    Colon cancer Neg Hx    Esophageal cancer Neg Hx    Stomach cancer Neg Hx    Rectal cancer Neg Hx    Inflammatory bowel disease Neg Hx    Liver disease Neg Hx    Pancreatic cancer Neg Hx    Colon polyps Neg Hx    Social History   Socioeconomic History   Marital status: Single    Spouse name: Not on file   Number of children: 0   Years of education: Not on file   Highest education level: Not on file  Occupational History   Occupation: retired  Tobacco Use   Smoking status: Every Day    Current packs/day: 1.50    Average packs/day: 1.1 packs/day for 44.1 years (47.3 ttl pk-yrs)    Types: Cigarettes    Start date: 08/01/2016   Smokeless tobacco: Never  Vaping Use   Vaping status: Never Used  Substance and Sexual Activity   Alcohol use: Not Currently    Comment: social rare   Drug use: No   Sexual activity: Yes    Comment: declined condoms 01/2023  Other Topics Concern   Not on file  Social History Narrative   Not on file   Social Drivers of Health   Financial Resource Strain: Low Risk  (01/18/2023)   Overall Financial Resource Strain (CARDIA)    Difficulty of Paying Living Expenses: Not hard at all  Food Insecurity: No Food Insecurity (01/18/2023)   Hunger Vital Sign    Worried About Running Out of Food in the Last Year: Never true    Ran Out of Food in the Last Year: Never true  Transportation Needs: No Transportation Needs (01/18/2023)   PRAPARE - Administrator, Civil Service (Medical): No    Lack of Transportation (Non-Medical): No  Physical Activity: Insufficiently Active (01/18/2023)   Exercise Vital Sign    Days of Exercise per  Week: 6 days    Minutes of Exercise per Session: 20 min  Stress: No Stress Concern Present (01/18/2023)   [employer] of Occupational Health - Occupational Stress Questionnaire    Feeling of Stress : Not at all  Social Connections: Socially Isolated (01/18/2023)   Social Connection and Isolation Panel [NHANES]    Frequency of Communication with Friends and Family: Three times a week    Frequency of Social Gatherings with Friends and Family: Once a week    Attends Religious Services: Never    Database administrator or Organizations: No    Attends Engineer, structural: Never    Marital Status: Never married    Tobacco Counseling Ready to quit: Not Answered Counseling given: Not Answered   Clinical Intake:  Pre-visit preparation completed: Yes  Pain : No/denies pain     Diabetes:  No  How often do you need to have someone help you when you read instructions, pamphlets, or other written materials from your doctor or pharmacy?: 1 - Never  Interpreter Needed?: No  Information entered by :: Remi Haggard LPN   Activities of Daily Living    01/18/2023    8:15 AM 01/17/2023    6:15 AM  In your present state of health, do you have any difficulty performing the following activities:  Hearing? 0 0  Vision? 0 0  Difficulty concentrating or making decisions? 0 0  Walking or climbing stairs? 0 0  Dressing or bathing? 0 0  Doing errands, shopping? 0 0  Preparing Food and eating ? N N  Using the Toilet? N N  In the past six months, have you accidently leaked urine? N N  Do you have problems with loss of bowel control? N N  Managing your Medications? N N  Managing your Finances? N N  Housekeeping or managing your Housekeeping? N N    Patient Care Team: Shade Flood, MD as PCP - General (Family Medicine) Daiva Eves, Lisette Grinder, MD as PCP - Infectious Diseases (Infectious Diseases) Radonna Ricker, RN (Inactive) as Oncology Nurse Navigator Malachy Mood, MD as  Consulting Physician (Hematology) Pollyann Samples, NP as Nurse Practitioner (Nurse Practitioner) Mansouraty, Netty Starring., MD as Consulting Physician (Gastroenterology)  Indicate any recent Medical Services you may have received from other than [facility] providers in the past year (date may be approximate).     Assessment:   This is a routine wellness examination for [place].  Hearing/Vision screen Hearing Screening - Comments:: No trouble hearing Vision Screening - Comments:: Not up to date [facility]   Goals Addressed             This Visit's Progress    Patient Stated       Stay healthy       Depression Screen    01/18/2023    8:15 AM 01/11/2023    8:25 AM 12/03/2022    8:11 AM 01/13/2022    9:07 AM 11/30/2021    8:00 AM 05/27/2021    8:40 AM 03/17/2021    1:46 PM  PHQ 2/9 Scores  PHQ - 2 Score 0 0 0 0 0 0 0  PHQ- 9 Score 0  0 0 0      Fall Risk    01/18/2023    8:11 AM 01/17/2023    6:15 AM 01/11/2023    8:25 AM 12/03/2022    8:11 AM 01/13/2022    9:01 AM  Fall Risk   Falls in the past year? 0 0 0 0 0  Number falls in past yr: 0 0 0 0 0  Injury with Fall? 0 0 0 0 0  Risk for fall due to :   No Fall Risks No Fall Risks   Follow up Falls evaluation completed;Education provided;Falls prevention discussed  Falls evaluation completed Falls evaluation completed Education provided;Falls evaluation completed;Falls prevention discussed    MEDICARE RISK AT HOME: Medicare Risk at Home Any stairs in or around the home?: Yes If so, are there any without handrails?: No Home free of loose throw rugs in walkways, pet beds, electrical cords, etc?: Yes Adequate lighting in your home to reduce risk of falls?: Yes Life alert?: No Use of a cane, walker or w/c?: No Grab bars in the bathroom?: Yes Shower chair or bench in shower?: No Elevated toilet seat or a handicapped toilet?: No  TIMED UP AND GO:  Was the test performed?  No    Cognitive Function:         01/18/2023    8:14 AM 01/13/2022    9:04 AM 11/26/2020    8:12 AM  6CIT Screen  What Year? 0 points 0 points 0 points  What month? 0 points 0 points 0 points  What time? 0 points 0 points 0 points  Count back from 20 0 points 0 points 0 points  Months in reverse 0 points 0 points 0 points  Repeat phrase 0 points 0 points 0 points  Total Score 0 points 0 points 0 points    Immunizations Immunization History  Administered Date(s) Administered   DTP 09/01/2009   Fluad Quad(high Dose 65+) 10/23/2018, 10/22/2021   H1N1 06/06/2008   Hepatitis A 07/23/2008, 09/01/2009   Hepatitis B 07/23/2008, 09/01/2009, 10/06/2010   Influenza Split 10/06/2011, 10/02/2012, 10/02/2013   Influenza Whole 11/14/2008, 09/01/2009, 10/06/2010   Influenza, High Dose Seasonal PF 10/15/2017, 10/25/2022   Influenza,inj,Quad PF,6+ Mos 11/03/2015, 12/16/2016   Influenza-Unspecified 11/17/2019, 10/02/2020, 11/09/2021   MODERNA SARS COV-2 Pediatric Vaccination 6mos to <101yrs 11/09/2021   Moderna Sars-Covid-2 Vaccination 03/16/2019, 04/13/2019, 06/17/2019, 05/05/2020, 10/29/2020   PPD Test 06/06/2008   Pfizer(Comirnaty)Fall Seasonal Vaccine 12 years and older 10/25/2022   Pneumococcal Conjugate-13 10/23/2018, 11/09/2021   Pneumococcal Polysaccharide-23 06/06/2008, 11/29/2019   Respiratory Syncytial Virus Vaccine,Recomb Aduvanted(Arexvy) 11/09/2021    TDAP status: Due, Education has been provided regarding the importance of this vaccine. Advised may receive this vaccine at local pharmacy or Health Dept. Aware to provide a copy of the vaccination record if obtained from local pharmacy or Health Dept. Verbalized acceptance and understanding.  Flu Vaccine status: Up to date  Pneumococcal vaccine status: Up to date  Covid-19 vaccine status: Declined, Education has been provided regarding the importance of this vaccine but patient still declined. Advised may receive this vaccine at local pharmacy or Health Dept.or  vaccine clinic. Aware to provide a copy of the vaccination record if obtained from local pharmacy or Health Dept. Verbalized acceptance and understanding.  Qualifies for Shingles Vaccine? Yes   Zostavax completed No   Shingrix Completed?: No.    Education has been provided regarding the importance of this vaccine. Patient has been advised to call insurance company to determine out of pocket expense if they have not yet received this vaccine. Advised may also receive vaccine at local pharmacy or Health Dept. Verbalized acceptance and understanding.  Screening Tests Health Maintenance  Topic Date Due   Colonoscopy  02/18/2023 (Originally 10/07/2022)   Lung Cancer Screening  01/18/2024 (Originally 02/20/2021)   DTaP/Tdap/Td (2 - Tdap) 01/18/2024 (Originally 09/02/2019)   Medicare Annual Wellness (AWV)  01/18/2024   Pneumonia Vaccine 55+ Years old  Completed   INFLUENZA VACCINE  Completed   Hepatitis C Screening  Completed   HPV VACCINES  Aged Out   COVID-19 Vaccine  Discontinued   Zoster Vaccines- Shingrix  Discontinued    Health Maintenance  There are no preventive care reminders to display for this patient.   Colorectal cancer screening: Type of screening: Colonoscopy. Completed 2023. Repeat every   years  Lung Cancer Screening: (Low Dose CT Chest recommended if Age 63-80 years, 20 pack-year currently smoking OR have quit w/in 15years.) does qualify.   Lung Cancer Screening Referral:   Additional Screening:  Hepatitis C Screening: does not qualify; Completed 2017  Vision Screening: Recommended annual ophthalmology exams for early detection of glaucoma and other disorders of  the eye. Is the patient up to date with their annual eye exam?  No  Who is the provider or what is the name of the office in which the patient attends annual eye exams?  If pt is not established with a provider, would they like to be referred to a provider to establish care? No .   Dental Screening: Recommended  annual dental exams for proper oral hygiene    Community Resource Referral / Chronic Care Management: CRR required this visit?  No   CCM required this visit?  No     Plan:     I have personally reviewed and noted the following in the patient's chart:   Medical and social history Use of alcohol, tobacco or illicit drugs  Current medications and supplements including opioid prescriptions. Patient is not currently taking opioid prescriptions. Functional ability and status Nutritional status Physical activity Advanced directives List of other physicians Hospitalizations, surgeries, and ER visits in previous 12 months Vitals Screenings to include cognitive, depression, and falls Referrals and appointments  In addition, I have reviewed and discussed with patient certain preventive protocols, quality metrics, and best practice recommendations. A written personalized care plan for preventive services as well as general preventive health recommendations were provided to patient.     Remi Haggard, LPN   13/09/6576   After Visit Summary: (MyChart) Due to this being a telephonic visit, the after visit summary with patients personalized plan was offered to patient via MyChart   Nurse Notes:

## 2023-03-28 ENCOUNTER — Other Ambulatory Visit: Payer: Self-pay | Admitting: Family Medicine

## 2023-03-28 DIAGNOSIS — I10 Essential (primary) hypertension: Secondary | ICD-10-CM

## 2023-03-28 MED ORDER — AMLODIPINE BESYLATE 10 MG PO TABS: 10.0000 mg | ORAL_TABLET | Freq: Every day | ORAL | 3 refills | Status: DC

## 2023-03-28 NOTE — Telephone Encounter (Signed)
 Copied from CRM [id number]. Topic: Clinical - Medication Refill >> Mar 28, 2023  8:20 AM Shelbie Proctor wrote: Most Recent Primary Care Visit:  Provider: Laurey Arrow  Department: [facility]  Visit Type: MEDICARE AWV, SEQUENTIAL  Date: 01/18/2023  Medication: amLODipine (NORVASC) 10 MG tablet  Has the patient contacted their pharmacy? No (Agent: If no, request that the patient contact the pharmacy for the refill. If patient does not wish to contact the pharmacy document the reason why and proceed with request.) (Agent: If yes, when and what did the pharmacy advise?)  Is this the correct pharmacy for this prescription? Yes If no, delete pharmacy and type the correct one.  This is the patient's preferred pharmacy:  [id number] [place], [place] - [address] AT [facility] OF [facility] & [address] [place] [address] Phone: [phone] Fax: [phone]    Has the prescription been filled recently? No  Is the patient out of the medication? Yes  Has the patient been seen for an appointment in the last year OR does the patient have an upcoming appointment? Yes  Can we respond through MyChart? Yes  Agent: Please be advised that Rx refills may take up to 3 business days. We ask that you follow-up with your pharmacy.

## 2023-03-28 NOTE — Telephone Encounter (Signed)
 Last Fill: Today  Receipt confirmed by pharmacy  No further action needed.

## 2023-03-28 NOTE — Telephone Encounter (Signed)
 Copied from CRM [id number]. Topic: Clinical - Medication Refill >> Mar 28, 2023  9:10 AM Irine Seal wrote: Most Recent Primary Care Visit:  Provider: Laurey Arrow  Department: [facility]  Visit Type: MEDICARE AWV, SEQUENTIAL  Date: 01/18/2023  Medication: amLODipine (NORVASC) 10 MG tablet  Has the patient contacted their pharmacy? Yes (Agent: If no, request that the patient contact the pharmacy for the refill. If patient does not wish to contact the pharmacy document the reason why and proceed with request.) (Agent: If yes, when and what did the pharmacy advise?) they told him it was denied   Is this the correct pharmacy for this prescription? Yes If no, delete pharmacy and type the correct one.  This is the patient's preferred pharmacy:  [id number] [place], [place] - [address] AT [facility] OF [facility] & [address] [place] [address] Phone: [phone] Fax: [phone]     Has the prescription been filled recently? No  Is the patient out of the medication? Yes  Has the patient been seen for an appointment in the last year OR does the patient have an upcoming appointment? Yes  Can we respond through MyChart? Yes  Agent: Please be advised that Rx refills may take up to 3 business days. We ask that you follow-up with your pharmacy.

## 2023-03-28 NOTE — Telephone Encounter (Signed)
 Last Fill: 12/03/22  Last OV: 12/03/22 Next OV: 12/12/23  Routing to provider for review/authorization.

## 2023-06-02 NOTE — Progress Notes (Signed)
 The ASCVD Risk score (Arnett DK, et al., 2019) failed to calculate for the following reasons:   The valid total cholesterol range is 130 to 320 mg/dL  Arlon Bergamo, BSN, RN

## 2023-10-03 ENCOUNTER — Other Ambulatory Visit: Payer: Self-pay | Admitting: Family Medicine

## 2023-10-03 DIAGNOSIS — I10 Essential (primary) hypertension: Secondary | ICD-10-CM

## 2023-10-31 ENCOUNTER — Other Ambulatory Visit: Payer: Self-pay

## 2023-10-31 ENCOUNTER — Other Ambulatory Visit: Payer: Medicare Other

## 2023-10-31 ENCOUNTER — Other Ambulatory Visit (HOSPITAL_COMMUNITY)
Admission: RE | Admit: 2023-10-31 | Discharge: 2023-10-31 | Disposition: A | Discharge status: Home / Self Care | Source: Ambulatory Visit | Attending: Infectious Disease | Admitting: Infectious Disease

## 2023-10-31 DIAGNOSIS — C182 Malignant neoplasm of ascending colon: Secondary | ICD-10-CM

## 2023-10-31 DIAGNOSIS — I10 Essential (primary) hypertension: Secondary | ICD-10-CM

## 2023-10-31 DIAGNOSIS — E785 Hyperlipidemia, unspecified: Secondary | ICD-10-CM

## 2023-10-31 DIAGNOSIS — A539 Syphilis, unspecified: Secondary | ICD-10-CM

## 2023-10-31 DIAGNOSIS — B20 Human immunodeficiency virus [HIV] disease: Secondary | ICD-10-CM | POA: Diagnosis present

## 2023-11-01 ENCOUNTER — Ambulatory Visit: Payer: Self-pay | Admitting: Family Medicine

## 2023-11-01 LAB — URINE CYTOLOGY ANCILLARY ONLY
Chlamydia: NEGATIVE
Comment: NEGATIVE
Comment: NORMAL
Neisseria Gonorrhea: NEGATIVE

## 2023-11-01 LAB — T-HELPER CELL (CD4) - (RCID CLINIC ONLY)
CD4 % Helper T Cell: 28 % — ABNORMAL LOW (ref 33–65)
CD4 T Cell Abs: 379 /uL — ABNORMAL LOW (ref 400–1790)

## 2023-11-03 LAB — CBC WITH DIFFERENTIAL/PLATELET
Absolute Lymphocytes: 1550 {cells}/uL (ref 850–3900)
Absolute Monocytes: 502 {cells}/uL (ref 200–950)
Basophils Absolute: 19 {cells}/uL (ref 0–200)
Basophils Relative: 0.3 %
Eosinophils Absolute: 322 {cells}/uL (ref 15–500)
Eosinophils Relative: 5.2 %
HCT: 51.8 % — ABNORMAL HIGH (ref 38.5–50.0)
Hemoglobin: 17.7 g/dL — ABNORMAL HIGH (ref 13.2–17.1)
MCH: 34.2 pg — ABNORMAL HIGH (ref 27.0–33.0)
MCHC: 34.2 g/dL (ref 32.0–36.0)
MCV: 100.2 fL — ABNORMAL HIGH (ref 80.0–100.0)
MPV: 10.8 fL (ref 7.5–12.5)
Monocytes Relative: 8.1 %
Neutro Abs: 3807 {cells}/uL (ref 1500–7800)
Neutrophils Relative %: 61.4 %
Platelets: 244 Thousand/uL (ref 140–400)
RBC: 5.17 Million/uL (ref 4.20–5.80)
RDW: 12.7 % (ref 11.0–15.0)
Total Lymphocyte: 25 %
WBC: 6.2 Thousand/uL (ref 3.8–10.8)

## 2023-11-03 LAB — COMPLETE METABOLIC PANEL WITHOUT GFR
AG Ratio: 1.4 (calc) (ref 1.0–2.5)
ALT: 27 U/L (ref 9–46)
AST: 25 U/L (ref 10–35)
Albumin: 4.2 g/dL (ref 3.6–5.1)
Alkaline phosphatase (APISO): 101 U/L (ref 35–144)
BUN: 10 mg/dL (ref 7–25)
CO2: 28 mmol/L (ref 20–32)
Calcium: 9.3 mg/dL (ref 8.6–10.3)
Chloride: 101 mmol/L (ref 98–110)
Creat: 1.19 mg/dL (ref 0.70–1.28)
Globulin: 2.9 g/dL (ref 1.9–3.7)
Glucose, Bld: 110 mg/dL — ABNORMAL HIGH (ref 65–99)
Potassium: 3.9 mmol/L (ref 3.5–5.3)
Sodium: 137 mmol/L (ref 135–146)
Total Bilirubin: 0.6 mg/dL (ref 0.2–1.2)
Total Protein: 7.1 g/dL (ref 6.1–8.1)

## 2023-11-03 LAB — HEPATITIS C ANTIBODY: Hepatitis C Ab: NONREACTIVE

## 2023-11-03 LAB — HIV-1 RNA QUANT-NO REFLEX-BLD
HIV 1 RNA Quant: <20 {copies}/mL — AB
HIV-1 RNA Quant, Log: <1.3 {Log_copies}/mL — AB

## 2023-11-03 LAB — T PALLIDUM AB: T Pallidum Abs: POSITIVE — AB

## 2023-11-03 LAB — RPR: RPR Ser Ql: REACTIVE — AB

## 2023-11-03 LAB — RPR TITER: RPR Titer: 1:4 {titer} — ABNORMAL HIGH

## 2023-11-14 ENCOUNTER — Ambulatory Visit: Payer: Self-pay | Admitting: Infectious Disease

## 2023-12-01 ENCOUNTER — Encounter: Payer: Self-pay | Admitting: Infectious Disease

## 2023-12-01 DIAGNOSIS — Z7185 Encounter for immunization safety counseling: Secondary | ICD-10-CM | POA: Insufficient documentation

## 2023-12-01 NOTE — Progress Notes (Signed)
 Subjective:  Chief complaint: follow-up for HIV disease on medications   Patient ID: [name], male    DOB: 1950-07-31, 73 y.o.   MRN: [id number]  HPI  Discussed the use of AI scribe software for clinical note transcription with the patient, who gave verbal consent to proceed.  History of Present Illness   [name] is a 73 year old male with colon cancer and HIV who presents for follow-up of his colon cancer surveillance and HIV management.  He has a history of colon cancer diagnosed in May 2021 after a colonoscopy revealed cancerous cells in a polyp. He underwent a polypectomy and declined further surgery. His CEA levels have been monitored, with the last test in October 2023 showing slight elevation but overall stability. A CT scan at the time of diagnosis was negative for metastatic disease. His last colonoscopy in September 2023 was negative for cancer recurrence. He is due for another colonoscopy but has difficulty finding someone to accompany him for the procedure. His brother passed away from colon cancer, which causes him significant anxiety about his own condition.  His HIV is well-managed with a viral load of less than 20 and a CD4 count of 379. He is currently on Dovato  for HIV management. His HIV status has been stable, with no recent complications reported.  He is also on amlodipine  for blood pressure management and atorvastatin  to reduce the risk of heart disease. He smokes cigarettes, which may be contributing to a slight polycythemia noted in his recent labs. He has received his flu, COVID, and shingles vaccines.       Past Medical History:  Diagnosis Date   BPH (benign prostatic hyperplasia)    Colon cancer [facility]    Erectile dysfunction    History of kidney stones    passed stones   HIV infection (HCC)    Hyperlipidemia 01/18/2022   Hypertension    Pruritic rash 04/21/2015   Secondary polycythemia 05/26/2016   Syphilis 04/19/2018    Past Surgical History:   Procedure Laterality Date   BIOPSY  02/11/2020   Procedure: BIOPSY;  Surgeon: Wilhelmenia Aloha Raddle., MD;  Location: [facility] ENDOSCOPY;  Service: Gastroenterology;;   COLONOSCOPY     COLONOSCOPY WITH PROPOFOL  N/A 06/13/2019   Procedure: COLONOSCOPY WITH PROPOFOL ;  Surgeon: Wilhelmenia Aloha Raddle., MD;  Location: [facility] ENDOSCOPY;  Service: Gastroenterology;  Laterality: N/A;   COLONOSCOPY WITH PROPOFOL  N/A 02/11/2020   Procedure: COLONOSCOPY WITH PROPOFOL ;  Surgeon: Mansouraty, Aloha Raddle., MD;  Location: [facility] ENDOSCOPY;  Service: Gastroenterology;  Laterality: N/A;   ENDOSCOPIC MUCOSAL RESECTION N/A 06/13/2019   Procedure: ENDOSCOPIC MUCOSAL RESECTION;  Surgeon: Wilhelmenia Aloha Raddle., MD;  Location: [facility] ENDOSCOPY;  Service: Gastroenterology;  Laterality: N/A;   HEMOSTASIS CLIP PLACEMENT  06/13/2019   Procedure: HEMOSTASIS CLIP PLACEMENT;  Surgeon: Wilhelmenia Aloha Raddle., MD;  Location: [facility] ENDOSCOPY;  Service: Gastroenterology;;   HEMOSTASIS CONTROL  06/13/2019   Procedure: HEMOSTASIS CONTROL;  Surgeon: Wilhelmenia Aloha Raddle., MD;  Location: [facility] ENDOSCOPY;  Service: Gastroenterology;;   IR ANGIOGRAM SELECTIVE EACH ADDITIONAL VESSEL  06/13/2019   IR ANGIOGRAM VISCERAL SELECTIVE  06/13/2019   IR EMBO ART  VEN HEMORR LYMPH EXTRAV  INC GUIDE ROADMAPPING  06/13/2019   IR US  GUIDE VASC ACCESS RIGHT  06/13/2019   PARTIAL COLECTOMY     SCLEROTHERAPY  06/13/2019   Procedure: SCLEROTHERAPY;  Surgeon: Mansouraty, Aloha Raddle., MD;  Location: [facility] ENDOSCOPY;  Service: Gastroenterology;;   SUBMUCOSAL LIFTING INJECTION  06/13/2019   Procedure: ROBLEY  LIFTING INJECTION;  Surgeon: Mansouraty, Aloha Raddle., MD;  Location: [facility] ENDOSCOPY;  Service: Gastroenterology;;    Family History  Problem Relation Age of Onset   Heart failure Mother    Stroke Father    Colon cancer Neg Hx    Esophageal cancer Neg Hx    Stomach cancer Neg Hx    Rectal cancer Neg Hx    Inflammatory bowel disease Neg Hx    Liver disease Neg Hx     Pancreatic cancer Neg Hx    Colon polyps Neg Hx       Social History   Socioeconomic History   Marital status: Single    Spouse name: Not on file   Number of children: 0   Years of education: Not on file   Highest education level: Not on file  Occupational History   Occupation: retired  Tobacco Use   Smoking status: Every Day    Current packs/day: 1.50    Average packs/day: 1.1 packs/day for 44.9 years (48.6 ttl pk-yrs)    Types: Cigarettes    Start date: 08/01/2016   Smokeless tobacco: Never  Vaping Use   Vaping status: Never Used  Substance and Sexual Activity   Alcohol use: Not Currently    Comment: social rare   Drug use: No   Sexual activity: Yes    Comment: declined condoms 01/2023  Other Topics Concern   Not on file  Social History Narrative   Not on file   Social Drivers of Health   Financial Resource Strain: Low Risk  (01/18/2023)   Overall Financial Resource Strain (CARDIA)    Difficulty of Paying Living Expenses: Not hard at all  Food Insecurity: No Food Insecurity (01/18/2023)   Hunger Vital Sign    Worried About Running Out of Food in the Last Year: Never true    Ran Out of Food in the Last Year: Never true  Transportation Needs: No Transportation Needs (01/18/2023)   PRAPARE - Administrator, Civil Service (Medical): No    Lack of Transportation (Non-Medical): No  Physical Activity: Insufficiently Active (01/18/2023)   Exercise Vital Sign    Days of Exercise per Week: 6 days    Minutes of Exercise per Session: 20 min  Stress: No Stress Concern Present (01/18/2023)   [employer] of Occupational Health - Occupational Stress Questionnaire    Feeling of Stress : Not at all  Social Connections: Socially Isolated (01/18/2023)   Social Connection and Isolation Panel    Frequency of Communication with Friends and Family: Three times a week    Frequency of Social Gatherings with Friends and Family: Once a week    Attends Religious  Services: Never    Database Administrator or Organizations: No    Attends Engineer, Structural: Never    Marital Status: Never married    No Known Allergies   Current Outpatient Medications:    amLODipine  (NORVASC ) 10 MG tablet, TAKE 1 TABLET BY MOUTH DAILY, Disp: 90 tablet, Rfl: 3   atorvastatin  (LIPITOR) 10 MG tablet, Take 1 tablet (10 mg total) by mouth daily., Disp: 30 tablet, Rfl: 11   dolutegravir-lamiVUDine (DOVATO ) 50-300 MG tablet, Take 1 tablet by mouth daily., Disp: 30 tablet, Rfl: 11    Review of Systems  Constitutional:  Negative for activity change, appetite change, chills, diaphoresis, fatigue, fever and unexpected weight change.  HENT:  Negative for congestion, rhinorrhea, sinus pressure, sneezing, sore throat and trouble swallowing.   Eyes:  Negative for photophobia and visual disturbance.  Respiratory:  Negative for cough, chest tightness, shortness of breath, wheezing and stridor.   Cardiovascular:  Negative for chest pain, palpitations and leg swelling.  Gastrointestinal:  Negative for abdominal distention, abdominal pain, anal bleeding, blood in stool, constipation, diarrhea, nausea and vomiting.  Genitourinary:  Negative for difficulty urinating, dysuria, flank pain and hematuria.  Musculoskeletal:  Negative for arthralgias, back pain, gait problem, joint swelling and myalgias.  Skin:  Negative for color change, pallor, rash and wound.  Neurological:  Negative for dizziness, tremors, weakness and light-headedness.  Hematological:  Negative for adenopathy. Does not bruise/bleed easily.  Psychiatric/Behavioral:  Negative for agitation, behavioral problems, confusion, decreased concentration, dysphoric mood and sleep disturbance.        Objective:   Physical Exam Constitutional:      Appearance: He is well-developed.  HENT:     Head: Normocephalic and atraumatic.  Eyes:     Conjunctiva/sclera: Conjunctivae normal.  Cardiovascular:     Rate and  Rhythm: Normal rate and regular rhythm.  Pulmonary:     Effort: Pulmonary effort is normal. No respiratory distress.     Breath sounds: No wheezing.  Abdominal:     General: There is no distension.     Palpations: Abdomen is soft.  Musculoskeletal:        General: No tenderness. Normal range of motion.     Cervical back: Normal range of motion and neck supple.  Skin:    General: Skin is warm and dry.     Coloration: Skin is not pale.     Findings: No erythema or rash.  Neurological:     General: No focal deficit present.     Mental Status: He is alert and oriented to person, place, and time.  Psychiatric:        Mood and Affect: Mood normal.        Behavior: Behavior normal.        Thought Content: Thought content normal.        Judgment: Judgment normal.           Assessment & Plan:   Assessment and Plan    Human immunodeficiency virus (HIV) disease HIV well-controlled with viral load <20 and CD4 count 379. - Continue Dovato . - Follow-up in 10 months for HIV monitoring.  History of right colon cancer, status post polypectomy, under surveillance No clinical suspicion for recurrence. Tumor marker stable. Emphasized regular surveillance due to cancer history. - Coordinate with GI for colonoscopy..  Secondary polycythemia due to smoking Polycythemia likely secondary to smoking. Smoking cessation advised to prevent complications. - Advise smoking cessation to reduce polycythemia risks.   Hyperlipidemia:  continue lipitor      HTN: continue amoldipine

## 2023-12-05 ENCOUNTER — Ambulatory Visit: Admitting: Infectious Disease

## 2023-12-05 ENCOUNTER — Encounter: Payer: Self-pay | Admitting: Infectious Disease

## 2023-12-05 ENCOUNTER — Other Ambulatory Visit: Payer: Self-pay

## 2023-12-05 VITALS — BP 150/88 | HR 92 | Temp 97.2°F | Ht 70.0 in | Wt 157.0 lb

## 2023-12-05 DIAGNOSIS — B20 Human immunodeficiency virus [HIV] disease: Secondary | ICD-10-CM | POA: Diagnosis present

## 2023-12-05 DIAGNOSIS — E785 Hyperlipidemia, unspecified: Secondary | ICD-10-CM | POA: Diagnosis not present

## 2023-12-05 DIAGNOSIS — C189 Malignant neoplasm of colon, unspecified: Secondary | ICD-10-CM | POA: Diagnosis not present

## 2023-12-05 DIAGNOSIS — Z7185 Encounter for immunization safety counseling: Secondary | ICD-10-CM

## 2023-12-05 DIAGNOSIS — D751 Secondary polycythemia: Secondary | ICD-10-CM

## 2023-12-05 DIAGNOSIS — I10 Essential (primary) hypertension: Secondary | ICD-10-CM

## 2023-12-05 MED ORDER — DOVATO 50-300 MG PO TABS: 1.0000 | ORAL_TABLET | Freq: Every day | ORAL | 11 refills | Status: AC

## 2023-12-05 NOTE — Patient Instructions (Signed)
 Smoking Cessation: QuitlineNC 1-800-QUIT-NOW 670-772-4699); Espaol: 1-855-Djelo-Ya (1-737-271-8849) [email or web address]

## 2023-12-12 ENCOUNTER — Ambulatory Visit: Payer: Medicare Other | Admitting: Family Medicine

## 2023-12-21 ENCOUNTER — Ambulatory Visit (INDEPENDENT_AMBULATORY_CARE_PROVIDER_SITE_OTHER): Admitting: Family Medicine

## 2023-12-21 ENCOUNTER — Encounter: Payer: Self-pay | Admitting: Family Medicine

## 2023-12-21 VITALS — BP 130/80 | HR 92 | Temp 98.6°F | Resp 16 | Ht 70.0 in | Wt 155.8 lb

## 2023-12-21 DIAGNOSIS — I700 Atherosclerosis of aorta: Secondary | ICD-10-CM

## 2023-12-21 DIAGNOSIS — I10 Essential (primary) hypertension: Secondary | ICD-10-CM | POA: Diagnosis not present

## 2023-12-21 DIAGNOSIS — E785 Hyperlipidemia, unspecified: Secondary | ICD-10-CM | POA: Diagnosis not present

## 2023-12-21 DIAGNOSIS — R739 Hyperglycemia, unspecified: Secondary | ICD-10-CM | POA: Diagnosis not present

## 2023-12-21 NOTE — Progress Notes (Signed)
 Subjective:  Patient ID: [name], male    DOB: 1950/06/07  Age: 73 y.o. MRN: [id number]  CC:  Chief Complaint  Patient presents with   Hypertension    Follow up visit. 140s/70s at home. Takes BP once a month at home. No questions or concerns.     HPI [name] presents for   Hypertension: Treated with amlodipine  10 mg every day.  Some frozen food, and added salt to food. Pizza.  No side effects on meds. Home readings: 140/70.  BP Readings from Last 3 Encounters:  12/21/23 130/80  12/05/23 (!) 150/88  01/11/23 135/84   Lab Results  Component Value Date   CREATININE 1.19 10/31/2023   Hyperlipidemia: Lipitor 10mg  every day.  Lab Results  Component Value Date   CHOL 112 12/03/2022   HDL 40.70 12/03/2022   LDLCALC 54 12/03/2022   TRIG 89.0 12/03/2022   CHOLHDL 3 12/03/2022   Lab Results  Component Value Date   ALT 27 10/31/2023   AST 25 10/31/2023   ALKPHOS 87 12/03/2022   BILITOT 0.6 10/31/2023   Hyperglycemia: Glucose 110 on 9/30.   No results found for: HGBA1C  Still followed by ID - Dr. VanDam  Flu vaccine, covid booster, shingrix at [facility] recently.   History Patient Active Problem List   Diagnosis Date Noted   Vaccine counseling 12/01/2023   Hyperlipidemia 01/18/2022   Cancer of right colon (HCC) 12/23/2019   Acute upper GI bleed 06/13/2019   Cecal polyp 05/26/2019   History of colonic polyps 05/26/2019   Abnormal CT scan, colon 05/26/2019   Syphilis 04/19/2018   Secondary polycythemia 05/26/2016   Pruritic rash 04/21/2015   Erectile dysfunction 03/02/2012   Allergic conjunctivitis 03/02/2012   HTN (hypertension) 10/06/2010   Smoking 10/06/2010   BPH (benign prostatic hyperplasia) 10/06/2010   COLONIC POLYPS 10/17/2008   CYSTS OF ORAL SOFT TISSUES 10/17/2008   DIVERTICULITIS OF COLON 10/17/2008   Human immunodeficiency virus (HIV) disease (HCC) 06/06/2008   Pancytopenia (HCC) 06/06/2008   LUNG NODULE 06/06/2008   RENAL  CALCULUS 06/06/2008   Past Medical History:  Diagnosis Date   BPH (benign prostatic hyperplasia)    Colon cancer (HCC)    Erectile dysfunction    History of kidney stones    passed stones   HIV infection (HCC)    Hyperlipidemia 01/18/2022   Hypertension    Pruritic rash 04/21/2015   Secondary polycythemia 05/26/2016   Syphilis 04/19/2018   Vaccine counseling 12/01/2023   Past Surgical History:  Procedure Laterality Date   BIOPSY  02/11/2020   Procedure: BIOPSY;  Surgeon: Wilhelmenia Aloha Raddle., MD;  Location: [facility] ENDOSCOPY;  Service: Gastroenterology;;   COLONOSCOPY     COLONOSCOPY WITH PROPOFOL  N/A 06/13/2019   Procedure: COLONOSCOPY WITH PROPOFOL ;  Surgeon: Wilhelmenia Aloha Raddle., MD;  Location: [facility] ENDOSCOPY;  Service: Gastroenterology;  Laterality: N/A;   COLONOSCOPY WITH PROPOFOL  N/A 02/11/2020   Procedure: COLONOSCOPY WITH PROPOFOL ;  Surgeon: Mansouraty, Aloha Raddle., MD;  Location: [facility] ENDOSCOPY;  Service: Gastroenterology;  Laterality: N/A;   ENDOSCOPIC MUCOSAL RESECTION N/A 06/13/2019   Procedure: ENDOSCOPIC MUCOSAL RESECTION;  Surgeon: Wilhelmenia Aloha Raddle., MD;  Location: [facility] ENDOSCOPY;  Service: Gastroenterology;  Laterality: N/A;   HEMOSTASIS CLIP PLACEMENT  06/13/2019   Procedure: HEMOSTASIS CLIP PLACEMENT;  Surgeon: Wilhelmenia Aloha Raddle., MD;  Location: [facility] ENDOSCOPY;  Service: Gastroenterology;;   HEMOSTASIS CONTROL  06/13/2019   Procedure: HEMOSTASIS CONTROL;  Surgeon: Wilhelmenia Aloha Raddle., MD;  Location: [facility] ENDOSCOPY;  Service:  Gastroenterology;;   IR ANGIOGRAM SELECTIVE EACH ADDITIONAL VESSEL  06/13/2019   IR ANGIOGRAM VISCERAL SELECTIVE  06/13/2019   IR EMBO ART  VEN HEMORR LYMPH EXTRAV  INC GUIDE ROADMAPPING  06/13/2019   IR US  GUIDE VASC ACCESS RIGHT  06/13/2019   PARTIAL COLECTOMY     SCLEROTHERAPY  06/13/2019   Procedure: SCLEROTHERAPY;  Surgeon: Mansouraty, Aloha Raddle., MD;  Location: [facility] ENDOSCOPY;  Service: Gastroenterology;;   SUBMUCOSAL LIFTING INJECTION   06/13/2019   Procedure: SUBMUCOSAL LIFTING INJECTION;  Surgeon: Wilhelmenia Aloha Raddle., MD;  Location: [facility] ENDOSCOPY;  Service: Gastroenterology;;   No Known Allergies Prior to Admission medications   Medication Sig Start Date End Date Taking? Authorizing Provider  amLODipine  (NORVASC ) 10 MG tablet TAKE 1 TABLET BY MOUTH DAILY 10/04/23  Yes Levora Reyes SAUNDERS, MD  atorvastatin  (LIPITOR) 10 MG tablet Take 1 tablet (10 mg total) by mouth daily. 12/03/22  Yes Levora Reyes SAUNDERS, MD  dolutegravir-lamiVUDine (DOVATO ) 50-300 MG tablet Take 1 tablet by mouth daily. 12/05/23  Yes Fleeta Rothman, Jomarie SAILOR, MD   Social History   Socioeconomic History   Marital status: Single    Spouse name: Not on file   Number of children: 0   Years of education: Not on file   Highest education level: Bachelor's degree (e.g., BA, AB, BS)  Occupational History   Occupation: retired  Tobacco Use   Smoking status: Every Day    Current packs/day: 1.50    Average packs/day: 1.1 packs/day for 45.0 years (48.7 ttl pk-yrs)    Types: Cigarettes    Start date: 08/01/2016   Smokeless tobacco: Never  Vaping Use   Vaping status: Never Used  Substance and Sexual Activity   Alcohol use: Not Currently    Comment: social rare   Drug use: No   Sexual activity: Yes  Other Topics Concern   Not on file  Social History Narrative   Not on file   Social Drivers of Health   Financial Resource Strain: Low Risk  (12/14/2023)   Overall Financial Resource Strain (CARDIA)    Difficulty of Paying Living Expenses: Not hard at all  Food Insecurity: Unknown (12/14/2023)   Hunger Vital Sign    Worried About Running Out of Food in the Last Year: Never true    Ran Out of Food in the Last Year: Patient declined  Transportation Needs: No Transportation Needs (12/14/2023)   PRAPARE - Administrator, Civil Service (Medical): No    Lack of Transportation (Non-Medical): No  Physical Activity: Sufficiently Active (12/14/2023)    Exercise Vital Sign    Days of Exercise per Week: 5 days    Minutes of Exercise per Session: 30 min  Stress: No Stress Concern Present (12/14/2023)   [employer] of Occupational Health - Occupational Stress Questionnaire    Feeling of Stress: Not at all  Social Connections: Unknown (12/14/2023)   Social Connection and Isolation Panel    Frequency of Communication with Friends and Family: More than three times a week    Frequency of Social Gatherings with Friends and Family: Twice a week    Attends Religious Services: Patient declined    Database Administrator or Organizations: No    Attends Banker Meetings: Not on file    Marital Status: Patient declined  Intimate Partner Violence: Not At Risk (01/18/2023)   Humiliation, Afraid, Rape, and Kick questionnaire    Fear of Current or Ex-Partner: No    Emotionally Abused: No  Physically Abused: No    Sexually Abused: No    Review of Systems  Constitutional:  Negative for fatigue and unexpected weight change.  Eyes:  Negative for visual disturbance.  Respiratory:  Negative for cough, chest tightness and shortness of breath.   Cardiovascular:  Negative for chest pain, palpitations and leg swelling.  Gastrointestinal:  Negative for abdominal pain and blood in stool.  Neurological:  Negative for dizziness, light-headedness and headaches.     Objective:   Vitals:   12/21/23 1431 12/21/23 1438  BP: (!) 142/88 130/80  Pulse: 92   Resp: 16   Temp: 98.6 F (37 C)   TempSrc: Temporal   SpO2: 97%   Weight: 155 lb 12.8 oz (70.7 kg)   Height: 5' 10 (1.778 m)      Physical Exam Vitals reviewed.  Constitutional:      Appearance: He is well-developed.  HENT:     Head: Normocephalic and atraumatic.  Neck:     Vascular: No carotid bruit or JVD.  Cardiovascular:     Rate and Rhythm: Normal rate and regular rhythm.     Heart sounds: Normal heart sounds. No murmur heard. Pulmonary:     Effort: Pulmonary effort  is normal.     Breath sounds: Normal breath sounds. No rales.  Musculoskeletal:     Right lower leg: No edema.     Left lower leg: No edema.  Skin:    General: Skin is warm and dry.  Neurological:     Mental Status: He is alert and oriented to person, place, and time.  Psychiatric:        Mood and Affect: Mood normal.        Assessment & Plan:  JAIZON DEROOS is a 73 y.o. male . Essential hypertension  Atherosclerosis of aorta - Plan: Lipid panel  Hyperglycemia - Plan: Hemoglobin A1c  Hyperlipidemia, unspecified hyperlipidemia type  Borderline blood pressure control.  Dietary changes discussed with cutting back on sodium in the diet including present foods and handout given on salty 6.  Home monitoring and if blood pressure improves with these changes can continue same dose of amlodipine , no additional meds.  RTC precautions if any persistent elevations.  Tolerating current dose of Lipitor for hyperlipidemia, aortic atherosclerosis as above.  Continue same.  Check labs and adjust plan accordingly.  No orders of the defined types were placed in this encounter.  Patient Instructions  Blood pressure is borderline at this time including on home readings.  See information below on blood pressure control. If you can cut back on frozen foods or high sodium foods, and cut back on salt in the diet that likely will improve your blood pressure to where the current dose of amlodipine  will be sufficient.  See the list of foods that can also be high in salt and try to avoid these as much as possible.  Recheck in the next 3 months and we can decide at that time if any adjustments needed but happy to see you sooner if any persistent elevated readings.  Keep me posted.   Managing Your Hypertension Hypertension, also called high blood pressure, is when the force of the blood pressing against the walls of the arteries is too strong. Arteries are blood vessels that carry blood from your heart  throughout your body. Hypertension forces the heart to work harder to pump blood and may cause the arteries to become narrow or stiff. Understanding blood pressure readings A blood pressure reading includes  a higher number over a lower number: The first, or top, number is called the systolic pressure. It is a measure of the pressure in your arteries as your heart beats. The second, or bottom number, is called the diastolic pressure. It is a measure of the pressure in your arteries as the heart relaxes. For most people, a normal blood pressure is below 120/80. Your personal target blood pressure may vary depending on your medical conditions, your age, and other factors. Blood pressure is classified into four stages. Based on your blood pressure reading, your health care provider may use the following stages to determine what type of treatment you need, if any. Systolic pressure and diastolic pressure are measured in a unit called millimeters of mercury (mmHg). Normal Systolic pressure: below 120. Diastolic pressure: below 80. Elevated Systolic pressure: 120-129. Diastolic pressure: below 80. Hypertension stage 1 Systolic pressure: 130-139. Diastolic pressure: 80-89. Hypertension stage 2 Systolic pressure: 140 or above. Diastolic pressure: 90 or above. How can this condition affect me? Managing your hypertension is very important. Over time, hypertension can damage the arteries and decrease blood flow to parts of the body, including the brain, heart, and kidneys. Having untreated or uncontrolled hypertension can lead to: A heart attack. A stroke. A weakened blood vessel (aneurysm). Heart failure. Kidney damage. Eye damage. Memory and concentration problems. Vascular dementia. What actions can I take to manage this condition? Hypertension can be managed by making lifestyle changes and possibly by taking medicines. Your health care provider will help you make a plan to bring your blood  pressure within a normal range. You may be referred for counseling on a healthy diet and physical activity. Nutrition  Eat a diet that is high in fiber and potassium, and low in salt (sodium), added sugar, and fat. An example eating plan is called the DASH diet. DASH stands for Dietary Approaches to Stop Hypertension. To eat this way: Eat plenty of fresh fruits and vegetables. Try to fill one-half of your plate at each meal with fruits and vegetables. Eat whole grains, such as whole-wheat pasta, brown rice, or whole-grain bread. Fill about one-fourth of your plate with whole grains. Eat low-fat dairy products. Avoid fatty cuts of meat, processed or cured meats, and poultry with skin. Fill about one-fourth of your plate with lean proteins such as fish, chicken without skin, beans, eggs, and tofu. Avoid pre-made and processed foods. These tend to be higher in sodium, added sugar, and fat. Reduce your daily sodium intake. Many people with hypertension should eat less than 1,500 mg of sodium a day. Lifestyle  Work with your health care provider to maintain a healthy body weight or to lose weight. Ask what an ideal weight is for you. Get at least 30 minutes of exercise that causes your heart to beat faster (aerobic exercise) most days of the week. Activities may include walking, swimming, or biking. Include exercise to strengthen your muscles (resistance exercise), such as weight lifting, as part of your weekly exercise routine. Try to do these types of exercises for 30 minutes at least 3 days a week. Do not use any products that contain nicotine or tobacco. These products include cigarettes, chewing tobacco, and vaping devices, such as e-cigarettes. If you need help quitting, ask your health care provider. Control any long-term (chronic) conditions you have, such as high cholesterol or diabetes. Identify your sources of stress and find ways to manage stress. This may include meditation, deep breathing,  or making time for fun activities.  Alcohol use Do not drink alcohol if: Your health care provider tells you not to drink. You are pregnant, may be pregnant, or are planning to become pregnant. If you drink alcohol: Limit how much you have to: 0-1 drink a day for women. 0-2 drinks a day for men. Know how much alcohol is in your drink. In the [place] one drink equals one 12 oz bottle of beer (355 mL), one 5 oz glass of wine (148 mL), or one 1 oz glass of hard liquor (44 mL). Medicines Your health care provider may prescribe medicine if lifestyle changes are not enough to get your blood pressure under control and if: Your systolic blood pressure is 130 or higher. Your diastolic blood pressure is 80 or higher. Take medicines only as told by your health care provider. Follow the directions carefully. Blood pressure medicines must be taken as told by your health care provider. The medicine does not work as well when you skip doses. Skipping doses also puts you at risk for problems. Monitoring Before you monitor your blood pressure: Do not smoke, drink caffeinated beverages, or exercise within 30 minutes before taking a measurement. Use the bathroom and empty your bladder (urinate). Sit quietly for at least 5 minutes before taking measurements. Monitor your blood pressure at home as told by your health care provider. To do this: Sit with your back straight and supported. Place your feet flat on the floor. Do not cross your legs. Support your arm on a flat surface, such as a table. Make sure your upper arm is at heart level. Each time you measure, take two or three readings one minute apart and record the results. You may also need to have your blood pressure checked regularly by your health care provider. General information Talk with your health care provider about your diet, exercise habits, and other lifestyle factors that may be contributing to hypertension. Review all the medicines you take  with your health care provider because there may be side effects or interactions. Keep all follow-up visits. Your health care provider can help you create and adjust your plan for managing your high blood pressure. Where to find more information National Heart, Lung, and Blood Institute: popsteam.is American Heart Association: www.heart.org Contact a health care provider if: You think you are having a reaction to medicines you have taken. You have repeated (recurrent) headaches. You feel dizzy. You have swelling in your ankles. You have trouble with your vision. Get help right away if: You develop a severe headache or confusion. You have unusual weakness or numbness, or you feel faint. You have severe pain in your chest or abdomen. You vomit repeatedly. You have trouble breathing. These symptoms may be an emergency. Get help right away. Call 911. Do not wait to see if the symptoms will go away. Do not drive yourself to the hospital. Summary Hypertension is when the force of blood pumping through your arteries is too strong. If this condition is not controlled, it may put you at risk for serious complications. Your personal target blood pressure may vary depending on your medical conditions, your age, and other factors. For most people, a normal blood pressure is less than 120/80. Hypertension is managed by lifestyle changes, medicines, or both. Lifestyle changes to help manage hypertension include losing weight, eating a healthy, low-sodium diet, exercising more, stopping smoking, and limiting alcohol. This information is not intended to replace advice given to you by your health care provider. Make sure you discuss any questions  you have with your health care provider. Document Revised: 10/02/2020 Document Reviewed: 10/02/2020 Elsevier Patient Education  2024 Elsevier Inc.      Signed,   Reyes Pines, MD [facility] 12/21/23 3:45 PM

## 2023-12-21 NOTE — Patient Instructions (Addendum)
 Blood pressure is borderline at this time including on home readings.  See information below on blood pressure control. If you can cut back on frozen foods or high sodium foods, and cut back on salt in the diet that likely will improve your blood pressure to where the current dose of amlodipine  will be sufficient.  See the list of foods that can also be high in salt and try to avoid these as much as possible.  Recheck in the next 3 months and we can decide at that time if any adjustments needed but happy to see you sooner if any persistent elevated readings.  Keep me posted.   Managing Your Hypertension Hypertension, also called high blood pressure, is when the force of the blood pressing against the walls of the arteries is too strong. Arteries are blood vessels that carry blood from your heart throughout your body. Hypertension forces the heart to work harder to pump blood and may cause the arteries to become narrow or stiff. Understanding blood pressure readings A blood pressure reading includes a higher number over a lower number: The first, or top, number is called the systolic pressure. It is a measure of the pressure in your arteries as your heart beats. The second, or bottom number, is called the diastolic pressure. It is a measure of the pressure in your arteries as the heart relaxes. For most people, a normal blood pressure is below 120/80. Your personal target blood pressure may vary depending on your medical conditions, your age, and other factors. Blood pressure is classified into four stages. Based on your blood pressure reading, your health care provider may use the following stages to determine what type of treatment you need, if any. Systolic pressure and diastolic pressure are measured in a unit called millimeters of mercury (mmHg). Normal Systolic pressure: below 120. Diastolic pressure: below 80. Elevated Systolic pressure: 120-129. Diastolic pressure: below 80. Hypertension  stage 1 Systolic pressure: 130-139. Diastolic pressure: 80-89. Hypertension stage 2 Systolic pressure: 140 or above. Diastolic pressure: 90 or above. How can this condition affect me? Managing your hypertension is very important. Over time, hypertension can damage the arteries and decrease blood flow to parts of the body, including the brain, heart, and kidneys. Having untreated or uncontrolled hypertension can lead to: A heart attack. A stroke. A weakened blood vessel (aneurysm). Heart failure. Kidney damage. Eye damage. Memory and concentration problems. Vascular dementia. What actions can I take to manage this condition? Hypertension can be managed by making lifestyle changes and possibly by taking medicines. Your health care provider will help you make a plan to bring your blood pressure within a normal range. You may be referred for counseling on a healthy diet and physical activity. Nutrition  Eat a diet that is high in fiber and potassium, and low in salt (sodium), added sugar, and fat. An example eating plan is called the DASH diet. DASH stands for Dietary Approaches to Stop Hypertension. To eat this way: Eat plenty of fresh fruits and vegetables. Try to fill one-half of your plate at each meal with fruits and vegetables. Eat whole grains, such as whole-wheat pasta, brown rice, or whole-grain bread. Fill about one-fourth of your plate with whole grains. Eat low-fat dairy products. Avoid fatty cuts of meat, processed or cured meats, and poultry with skin. Fill about one-fourth of your plate with lean proteins such as fish, chicken without skin, beans, eggs, and tofu. Avoid pre-made and processed foods. These tend to be higher in sodium,  added sugar, and fat. Reduce your daily sodium intake. Many people with hypertension should eat less than 1,500 mg of sodium a day. Lifestyle  Work with your health care provider to maintain a healthy body weight or to lose weight. Ask what an  ideal weight is for you. Get at least 30 minutes of exercise that causes your heart to beat faster (aerobic exercise) most days of the week. Activities may include walking, swimming, or biking. Include exercise to strengthen your muscles (resistance exercise), such as weight lifting, as part of your weekly exercise routine. Try to do these types of exercises for 30 minutes at least 3 days a week. Do not use any products that contain nicotine or tobacco. These products include cigarettes, chewing tobacco, and vaping devices, such as e-cigarettes. If you need help quitting, ask your health care provider. Control any long-term (chronic) conditions you have, such as high cholesterol or diabetes. Identify your sources of stress and find ways to manage stress. This may include meditation, deep breathing, or making time for fun activities. Alcohol use Do not drink alcohol if: Your health care provider tells you not to drink. You are pregnant, may be pregnant, or are planning to become pregnant. If you drink alcohol: Limit how much you have to: 0-1 drink a day for women. 0-2 drinks a day for men. Know how much alcohol is in your drink. In the [place] one drink equals one 12 oz bottle of beer (355 mL), one 5 oz glass of wine (148 mL), or one 1 oz glass of hard liquor (44 mL). Medicines Your health care provider may prescribe medicine if lifestyle changes are not enough to get your blood pressure under control and if: Your systolic blood pressure is 130 or higher. Your diastolic blood pressure is 80 or higher. Take medicines only as told by your health care provider. Follow the directions carefully. Blood pressure medicines must be taken as told by your health care provider. The medicine does not work as well when you skip doses. Skipping doses also puts you at risk for problems. Monitoring Before you monitor your blood pressure: Do not smoke, drink caffeinated beverages, or exercise within 30 minutes  before taking a measurement. Use the bathroom and empty your bladder (urinate). Sit quietly for at least 5 minutes before taking measurements. Monitor your blood pressure at home as told by your health care provider. To do this: Sit with your back straight and supported. Place your feet flat on the floor. Do not cross your legs. Support your arm on a flat surface, such as a table. Make sure your upper arm is at heart level. Each time you measure, take two or three readings one minute apart and record the results. You may also need to have your blood pressure checked regularly by your health care provider. General information Talk with your health care provider about your diet, exercise habits, and other lifestyle factors that may be contributing to hypertension. Review all the medicines you take with your health care provider because there may be side effects or interactions. Keep all follow-up visits. Your health care provider can help you create and adjust your plan for managing your high blood pressure. Where to find more information National [employer], [employer], [employer]: popsteam.is American Heart Association: www.heart.org Contact a health care provider if: You think you are having a reaction to medicines you have taken. You have repeated (recurrent) headaches. You feel dizzy. You have swelling in your ankles. You have trouble  with your vision. Get help right away if: You develop a severe headache or confusion. You have unusual weakness or numbness, or you feel faint. You have severe pain in your chest or abdomen. You vomit repeatedly. You have trouble breathing. These symptoms may be an emergency. Get help right away. Call 911. Do not wait to see if the symptoms will go away. Do not drive yourself to the hospital. Summary Hypertension is when the force of blood pumping through your arteries is too strong. If this condition is not controlled, it may put you at risk for  serious complications. Your personal target blood pressure may vary depending on your medical conditions, your age, and other factors. For most people, a normal blood pressure is less than 120/80. Hypertension is managed by lifestyle changes, medicines, or both. Lifestyle changes to help manage hypertension include losing weight, eating a healthy, low-sodium diet, exercising more, stopping smoking, and limiting alcohol. This information is not intended to replace advice given to you by your health care provider. Make sure you discuss any questions you have with your health care provider. Document Revised: 10/02/2020 Document Reviewed: 10/02/2020 [employer] Patient Education  2024 [employer].

## 2023-12-22 LAB — LIPID PANEL
Cholesterol: 102 mg/dL (ref 0–200)
HDL: 45.1 mg/dL (ref >39.00)
LDL Cholesterol: 42 mg/dL (ref 0–99)
NonHDL: 56.77
Total CHOL/HDL Ratio: 2
Triglycerides: 73 mg/dL (ref 0.0–149.0)
VLDL: 14.6 mg/dL (ref 0.0–40.0)

## 2023-12-22 LAB — HEMOGLOBIN A1C: Hgb A1c MFr Bld: 5.5 % (ref 4.6–6.5)

## 2023-12-25 ENCOUNTER — Ambulatory Visit: Payer: Self-pay | Admitting: Family Medicine

## 2024-02-08 ENCOUNTER — Ambulatory Visit

## 2024-03-22 ENCOUNTER — Ambulatory Visit: Admitting: Family Medicine

## 2024-10-09 ENCOUNTER — Other Ambulatory Visit

## 2024-10-23 ENCOUNTER — Ambulatory Visit: Payer: Self-pay | Admitting: Infectious Disease
